# Patient Record
Sex: Female | Born: 1985 | Race: Black or African American | Hispanic: No | Marital: Single | Smoking: Former smoker
Health system: Southern US, Community
[De-identification: ages and names within clinical notes are randomized; demographics above are authoritative.]

## PROBLEM LIST (undated history)

## (undated) ENCOUNTER — Inpatient Hospital Stay (HOSPITAL_COMMUNITY): Payer: Self-pay

## (undated) DIAGNOSIS — I10 Essential (primary) hypertension: Secondary | ICD-10-CM

## (undated) DIAGNOSIS — J301 Allergic rhinitis due to pollen: Secondary | ICD-10-CM

## (undated) DIAGNOSIS — O1490 Unspecified pre-eclampsia, unspecified trimester: Secondary | ICD-10-CM

## (undated) DIAGNOSIS — J40 Bronchitis, not specified as acute or chronic: Secondary | ICD-10-CM

## (undated) HISTORY — DX: Morbid (severe) obesity due to excess calories: E66.01

## (undated) HISTORY — PX: NO PAST SURGERIES: SHX2092

---

## 2005-07-29 ENCOUNTER — Emergency Department (HOSPITAL_COMMUNITY): Admission: EM | Admit: 2005-07-29 | Discharge: 2005-07-29 | Payer: Self-pay | Admitting: Emergency Medicine

## 2006-02-06 ENCOUNTER — Emergency Department (HOSPITAL_COMMUNITY): Admission: EM | Admit: 2006-02-06 | Discharge: 2006-02-06 | Payer: Self-pay | Admitting: Emergency Medicine

## 2008-08-18 ENCOUNTER — Other Ambulatory Visit: Admission: RE | Admit: 2008-08-18 | Discharge: 2008-08-18 | Payer: Self-pay | Admitting: Family Medicine

## 2009-04-13 ENCOUNTER — Emergency Department (HOSPITAL_COMMUNITY): Admission: EM | Admit: 2009-04-13 | Discharge: 2009-04-13 | Payer: Self-pay | Admitting: Emergency Medicine

## 2009-06-30 ENCOUNTER — Emergency Department (HOSPITAL_COMMUNITY): Admission: EM | Admit: 2009-06-30 | Discharge: 2009-06-30 | Payer: Self-pay | Admitting: Family Medicine

## 2009-07-16 ENCOUNTER — Emergency Department (HOSPITAL_COMMUNITY): Admission: EM | Admit: 2009-07-16 | Discharge: 2009-07-16 | Payer: Self-pay | Admitting: Family Medicine

## 2009-11-28 ENCOUNTER — Emergency Department (HOSPITAL_COMMUNITY): Admission: EM | Admit: 2009-11-28 | Discharge: 2009-11-28 | Payer: Self-pay | Admitting: Emergency Medicine

## 2010-05-20 LAB — URINALYSIS, ROUTINE W REFLEX MICROSCOPIC
Glucose, UA: NEGATIVE mg/dL
Hgb urine dipstick: NEGATIVE

## 2010-05-20 LAB — URINE MICROSCOPIC-ADD ON

## 2010-05-25 LAB — POCT URINALYSIS DIP (DEVICE)
Bilirubin Urine: NEGATIVE
Protein, ur: NEGATIVE mg/dL

## 2010-05-25 LAB — POCT PREGNANCY, URINE: Preg Test, Ur: NEGATIVE

## 2010-09-02 ENCOUNTER — Emergency Department (HOSPITAL_COMMUNITY)
Admission: EM | Admit: 2010-09-02 | Discharge: 2010-09-02 | Disposition: A | Discharge status: Home / Self Care | Payer: Self-pay | Attending: Emergency Medicine | Admitting: Emergency Medicine

## 2010-09-02 DIAGNOSIS — R51 Headache: Secondary | ICD-10-CM | POA: Insufficient documentation

## 2010-09-02 DIAGNOSIS — K089 Disorder of teeth and supporting structures, unspecified: Secondary | ICD-10-CM | POA: Insufficient documentation

## 2010-09-02 DIAGNOSIS — K047 Periapical abscess without sinus: Secondary | ICD-10-CM | POA: Insufficient documentation

## 2010-09-02 DIAGNOSIS — R220 Localized swelling, mass and lump, head: Secondary | ICD-10-CM | POA: Insufficient documentation

## 2011-03-08 NOTE — L&D Delivery Note (Signed)
Delivery Note At 1:33 PM a viable female was delivered via Vaginal, Spontaneous Delivery (Presentation: Left Occiput Anterior).  APGAR: 7, 9.   Placenta status: Intact, Spontaneous.  Cord: 3 vessels with the following complications: None.   Anesthesia: Epidural  Episiotomy: None Lacerations: 2nd degree Perineal;Periurethral Suture Repair: 2.0 3.0 vicryl rapide Est. Blood Loss (mL): 400 ml  Mom to postpartum.  Baby to nursery-stable.  JACKSON-MOORE,Romell Cavanah A 02/20/2012, 2:11 PM

## 2011-07-12 ENCOUNTER — Encounter (HOSPITAL_COMMUNITY): Payer: Self-pay | Admitting: *Deleted

## 2011-07-12 ENCOUNTER — Emergency Department (HOSPITAL_COMMUNITY)
Admission: EM | Admit: 2011-07-12 | Discharge: 2011-07-12 | Disposition: A | Discharge status: Home / Self Care | Payer: Medicaid Other | Attending: Emergency Medicine | Admitting: Emergency Medicine

## 2011-07-12 ENCOUNTER — Inpatient Hospital Stay (HOSPITAL_COMMUNITY): Payer: Medicaid Other

## 2011-07-12 ENCOUNTER — Inpatient Hospital Stay (HOSPITAL_COMMUNITY)
Admission: AD | Admit: 2011-07-12 | Discharge: 2011-07-12 | Disposition: A | Discharge status: Home / Self Care | Payer: Medicaid Other | Source: Ambulatory Visit | Attending: Family Medicine | Admitting: Family Medicine

## 2011-07-12 DIAGNOSIS — A5901 Trichomonal vulvovaginitis: Secondary | ICD-10-CM | POA: Insufficient documentation

## 2011-07-12 DIAGNOSIS — O210 Mild hyperemesis gravidarum: Secondary | ICD-10-CM | POA: Insufficient documentation

## 2011-07-12 DIAGNOSIS — O99891 Other specified diseases and conditions complicating pregnancy: Secondary | ICD-10-CM | POA: Insufficient documentation

## 2011-07-12 DIAGNOSIS — Z331 Pregnant state, incidental: Secondary | ICD-10-CM

## 2011-07-12 DIAGNOSIS — J069 Acute upper respiratory infection, unspecified: Secondary | ICD-10-CM | POA: Insufficient documentation

## 2011-07-12 DIAGNOSIS — O98819 Other maternal infectious and parasitic diseases complicating pregnancy, unspecified trimester: Secondary | ICD-10-CM | POA: Insufficient documentation

## 2011-07-12 DIAGNOSIS — R110 Nausea: Secondary | ICD-10-CM

## 2011-07-12 LAB — CBC
MCV: 85.3 fL (ref 78.0–100.0)
Platelets: 274 10*3/uL (ref 150–400)
RBC: 4.29 MIL/uL (ref 3.87–5.11)
WBC: 9.7 10*3/uL (ref 4.0–10.5)

## 2011-07-12 LAB — ABO/RH: ABO/RH(D): A POS

## 2011-07-12 LAB — URINALYSIS, ROUTINE W REFLEX MICROSCOPIC
Bilirubin Urine: NEGATIVE
Glucose, UA: NEGATIVE mg/dL
Ketones, ur: 15 mg/dL — AB
Specific Gravity, Urine: 1.029 (ref 1.005–1.030)

## 2011-07-12 LAB — POCT PREGNANCY, URINE: Preg Test, Ur: POSITIVE — AB

## 2011-07-12 LAB — URINE MICROSCOPIC-ADD ON

## 2011-07-12 LAB — WET PREP, GENITAL: Clue Cells Wet Prep HPF POC: NONE SEEN

## 2011-07-12 LAB — HCG, QUANTITATIVE, PREGNANCY: hCG, Beta Chain, Quant, S: 43527 m[IU]/mL — ABNORMAL HIGH (ref <5)

## 2011-07-12 MED ORDER — METRONIDAZOLE 500 MG PO TABS
1000.0000 mg | ORAL_TABLET | Freq: Once | ORAL | Status: AC
  Administered 2011-07-12: 1000 mg via ORAL
  Filled 2011-07-12: qty 2

## 2011-07-12 MED ORDER — PRENATAL VITAMINS (DIS) PO TABS: 1.0000 | ORAL_TABLET | Freq: Every day | ORAL | Status: DC

## 2011-07-12 MED ORDER — METOCLOPRAMIDE HCL 10 MG PO TABS: 10.0000 mg | ORAL_TABLET | Freq: Four times a day (QID) | ORAL | Status: DC | PRN

## 2011-07-12 MED ORDER — ONDANSETRON 8 MG PO TBDP: 8.0000 mg | ORAL_TABLET | Freq: Three times a day (TID) | ORAL | Status: AC | PRN

## 2011-07-12 MED ORDER — PROMETHAZINE HCL 25 MG PO TABS: 25.0000 mg | ORAL_TABLET | Freq: Four times a day (QID) | ORAL | Status: DC | PRN

## 2011-07-12 NOTE — MAU Note (Signed)
Went to [facility] today, has been have headaches, hot at times.  Found out she was preg, wants to know how far along she is. Has been spotting the past month. LMP some time in March (mid).

## 2011-07-12 NOTE — Discharge Instructions (Signed)
Make an appointment with a gynecologist for prenatal care.  Pregnancy If you are planning on getting pregnant, it is a good idea to make a preconception appointment with your care- giver to discuss having a healthy lifestyle before getting pregnant. Such as, diet, weight, exercise, taking prenatal vitamins especially folic acid (it helps prevent brain and spinal cord defects), avoiding alcohol, smoking and illegal drugs, medical problems (diabetes, convulsions), family history of genetic problems, working conditions and immunizations. It is better to have knowledge of these things and do something about them before getting pregnant. In your pregnancy, it is important to follow certain guidelines to have a healthy baby. It is very important to get good prenatal care and follow your caregiver's instructions. Prenatal care includes all the medical care you receive before your baby's birth. This helps to prevent problems during the pregnancy and childbirth. HOME CARE INSTRUCTIONS   Start your prenatal visits by the 12th week of pregnancy or before when possible. They are usually scheduled monthly at first. They are more often in the last 2 months before delivery. It is important that you keep your caregiver's appointments and follow your caregiver's instructions regarding medication use, exercise, and diet.   During pregnancy, you are providing food for you and your baby. Eat a regular, well-balanced diet. Choose foods such as meat, fish, milk and other dairy products, vegetables, fruits, whole-grain breads and cereals. Your caregiver will inform you of the ideal weight gain depending on your current height and weight. Drink lots of liquids. Try to drink 8 glasses of water a day.   Alcohol is associated with a number of birth defects including fetal alcohol syndrome. It is best to avoid alcohol completely. Smoking will cause low birth rate and prematurity. Use of alcohol and nicotine during your pregnancy  also increases the chances that your child will be chemically dependent later in their life and may contribute to SIDS (Sudden Infant Death Syndrome).   Do not use illegal drugs.   Only take prescription or over-the-counter medications that are recommended by your caregiver. Other medications can cause genetic and physical problems in the baby.   Morning sickness can often be helped by keeping soda crackers at the bedside. Eat a couple before arising in the morning.   A sexual relationship may be continued until near the end of pregnancy if there are no other problems such as early (premature) leaking of amniotic fluid from the membranes, vaginal bleeding, painful intercourse or belly (abdominal) pain.   Exercise regularly. Check with your caregiver if you are unsure of the safety of some of your exercises.   Do not use hot tubs, steam rooms or saunas. These increase the risk of fainting or passing out and hurting yourself and the baby. Swimming is OK for exercise. Get plenty of rest, including afternoon naps when possible especially in the third trimester.   Avoid toxic odors and chemicals.   Do not wear high heels. They may cause you to lose your balance and fall.   Do not lift over 5 pounds. If you do lift anything, lift with your legs and thighs, not your back.   Avoid long trips, especially in the third trimester.   If you have to travel out of the city or state, take a copy of your medical records with you.  SEEK IMMEDIATE MEDICAL CARE IF:   You develop an unexplained oral temperature above 102 F (38.9 C), or as your caregiver suggests.   You have leaking of  fluid from the vagina. If leaking membranes are suspected, take your temperature and inform your caregiver of this when you call.   There is vaginal spotting or bleeding. Notify your caregiver of the amount and how many pads are used.   You continue to feel sick to your stomach (nauseous) and have no relief from remedies  suggested, or you throw up (vomit) blood or coffee ground like materials.   You develop upper abdominal pain.   You have round ligament discomfort in the lower abdominal area. This still must be evaluated by your caregiver.   You feel contractions of the uterus.   You do not feel the baby move, or there is less movement than before.   You have painful urination.   You have abnormal vaginal discharge.   You have persistent diarrhea.   You get a severe headache.   You have problems with your vision.   You develop muscle weakness.   You feel dizzy and faint.   You develop shortness of breath.   You develop chest pain.   You have back pain that travels down to your leg and feet.   You feel irregular or a very fast heartbeat.   You develop excessive weight gain in a short period of time (5 pounds in 3 to 5 days).   You are involved with a domestic violence situation.  Document Released: 02/21/2005 Document Revised: 02/10/2011 Document Reviewed: 08/15/2008 [facility] Patient Information 2012 [place], [place].  Upper Respiratory Infection, Adult An upper respiratory infection (URI) is also sometimes known as the common cold. The upper respiratory tract includes the nose, sinuses, throat, trachea, and bronchi. Bronchi are the airways leading to the lungs. Most people improve within 1 week, but symptoms can last up to 2 weeks. A residual cough may last even longer.  CAUSES Many different viruses can infect the tissues lining the upper respiratory tract. The tissues become irritated and inflamed and often become very moist. Mucus production is also common. A cold is contagious. You can easily spread the virus to others by oral contact. This includes kissing, sharing a glass, coughing, or sneezing. Touching your mouth or nose and then touching a surface, which is then touched by another person, can also spread the virus. SYMPTOMS  Symptoms typically develop 1 to 3 days after you come in  contact with a cold virus. Symptoms vary from person to person. They may include:  Runny nose.   Sneezing.   Nasal congestion.   Sinus irritation.   Sore throat.   Loss of voice (laryngitis).   Cough.   Fatigue.   Muscle aches.   Loss of appetite.   Headache.   Low-grade fever.  DIAGNOSIS  You might diagnose your own cold based on familiar symptoms, since most people get a cold 2 to 3 times a year. Your caregiver can confirm this based on your exam. Most importantly, your caregiver can check that your symptoms are not due to another disease such as strep throat, sinusitis, pneumonia, asthma, or epiglottitis. Blood tests, throat tests, and X-rays are not necessary to diagnose a common cold, but they may sometimes be helpful in excluding other more serious diseases. Your caregiver will decide if any further tests are required. RISKS AND COMPLICATIONS  You may be at risk for a more severe case of the common cold if you smoke cigarettes, have chronic heart disease (such as heart failure) or lung disease (such as asthma), or if you have a weakened immune system. The  very young and very old are also at risk for more serious infections. Bacterial sinusitis, middle ear infections, and bacterial pneumonia can complicate the common cold. The common cold can worsen asthma and chronic obstructive pulmonary disease (COPD). Sometimes, these complications can require emergency medical care and may be life-threatening. PREVENTION  The best way to protect against getting a cold is to practice good hygiene. Avoid oral or hand contact with people with cold symptoms. Wash your hands often if contact occurs. There is no clear evidence that vitamin C, vitamin E, echinacea, or exercise reduces the chance of developing a cold. However, it is always recommended to get plenty of rest and practice good nutrition. TREATMENT  Treatment is directed at relieving symptoms. There is no cure. Antibiotics are not  effective, because the infection is caused by a virus, not by bacteria. Treatment may include:  Increased fluid intake. Sports drinks offer valuable electrolytes, sugars, and fluids.   Breathing heated mist or steam (vaporizer or shower).   Eating chicken soup or other clear broths, and maintaining good nutrition.   Getting plenty of rest.   Using gargles or lozenges for comfort.   Controlling fevers with ibuprofen or acetaminophen as directed by your caregiver.   Increasing usage of your inhaler if you have asthma.  Zinc gel and zinc lozenges, taken in the first 24 hours of the common cold, can shorten the duration and lessen the severity of symptoms. Pain medicines may help with fever, muscle aches, and throat pain. A variety of non-prescription medicines are available to treat congestion and runny nose. Your caregiver can make recommendations and may suggest nasal or lung inhalers for other symptoms.  HOME CARE INSTRUCTIONS   Only take over-the-counter or prescription medicines for pain, discomfort, or fever as directed by your caregiver.   Use a warm mist humidifier or inhale steam from a shower to increase air moisture. This may keep secretions moist and make it easier to breathe.   Drink enough water and fluids to keep your urine clear or pale yellow.   Rest as needed.   Return to work when your temperature has returned to normal or as your caregiver advises. You may need to stay home longer to avoid infecting others. You can also use a face mask and careful hand washing to prevent spread of the virus.  SEEK MEDICAL CARE IF:   After the first few days, you feel you are getting worse rather than better.   You need your caregiver's advice about medicines to control symptoms.   You develop chills, worsening shortness of breath, or brown or red sputum. These may be signs of pneumonia.   You develop yellow or brown nasal discharge or pain in the face, especially when you bend  forward. These may be signs of sinusitis.   You develop a fever, swollen neck glands, pain with swallowing, or white areas in the back of your throat. These may be signs of strep throat.  SEEK IMMEDIATE MEDICAL CARE IF:   You have a fever.   You develop severe or persistent headache, ear pain, sinus pain, or chest pain.   You develop wheezing, a prolonged cough, cough up blood, or have a change in your usual mucus (if you have chronic lung disease).   You develop sore muscles or a stiff neck.  Document Released: 08/17/2000 Document Revised: 02/10/2011 Document Reviewed: 06/25/2010 [facility] Patient Information 2012 [place], [place].  Nausea, Adult Nausea is the feeling that you have an upset stomach or  have to vomit. Nausea by itself is not likely a serious concern, but it may be an early sign of more serious medical problems. As nausea gets worse, it can lead to vomiting. If vomiting develops, there is the risk of dehydration.  CAUSES   Viral infections.   Food poisoning.   Medicines.   Pregnancy.   Motion sickness.   Migraine headaches.   Emotional distress.   Severe pain from any source.   Alcohol intoxication.  HOME CARE INSTRUCTIONS  Get plenty of rest.   Ask your caregiver about specific rehydration instructions.   Eat small amounts of food and sip liquids more often.   Take all medicines as told by your caregiver.  SEEK MEDICAL CARE IF:  You have not improved after 2 days, or you get worse.   You have a headache.  SEEK IMMEDIATE MEDICAL CARE IF:   You have a fever.   You faint.   You keep vomiting or have blood in your vomit.   You are extremely weak or dehydrated.   You have dark or bloody stools.   You have severe chest or abdominal pain.  MAKE SURE YOU:  Understand these instructions.   Will watch your condition.   Will get help right away if you are not doing well or get worse.  Document Released: 03/31/2004 Document Revised: 02/10/2011  Document Reviewed: 11/03/2010 [facility] Patient Information 2012 [place], [place].  Vitamin A capsules and tablets What is this medicine? VITAMIN A (VAHY tuh min A) is a vitamin found in nature. It is added to a healthy diet to prevent or treat low vitamin A levels. It is also used to treat some genetic skin problems. This vitamin may be used for other purposes; ask your health care provider or pharmacist if you have questions. This medicine may be used for other purposes; ask your health care provider or pharmacist if you have questions. What should I tell my health care provider before I take this medicine? They need to know if you have any of the following conditions: -high levels of vitamin A in the body -kidney disease -liver disease -an unusual or allergic reaction to vitamin A, other medicines, foods, dyes, or preservatives -pregnant or trying to get pregnant -breast-feeding How should I use this medicine? Take this vitamin by mouth with a glass of water. Follow the directions on the package or prescription label. For best results take this vitamin with food. Take your vitamin at regular intervals. Do not take your vitamin more often than directed. Talk to your pediatrician regarding the use of this medicine in children. While this drug may be prescribed for selected conditions, precautions do apply. Overdosage: If you think you have taken too much of this medicine contact a poison control center or emergency room at once. NOTE: This medicine is only for you. Do not share this medicine with others. What if I miss a dose? If you miss a dose, take it as soon as you can. If it is almost time for your next dose, take only that dose. Do not take double or extra doses. What may interact with this medicine? Do not take this medicine with any of the following medications: -other vitamin A or retinoid products This medicine may also interact with the following medications: -beta-carotene  supplements -cholestyramine -mineral oil -orlistat This list may not describe all possible interactions. Give your health care provider a list of all the medicines, herbs, non-prescription drugs, or dietary supplements you use. Also tell  them if you smoke, drink alcohol, or use illegal drugs. Some items may interact with your medicine. What should I watch for while using this medicine? Follow a good diet. Taking a vitamin supplement does not replace the need for a balanced diet. Some foods that have this vitamin naturally are green and yellow fruits and vegetables, also eggs, butter, milk, meat, and oily fish. Too much of this vitamin can be unsafe. Talk to your doctor or health care provider about how much is right for you. What side effects may I notice from receiving this medicine? Side effects that you should report to your doctor or health care professional as soon as possible: -allergic reactions like skin rash, itching or hives, swelling of the face, lips, or tongue -dark urine -dry, cracked or peeling of skin -joint pains -unusual bleeding or bruising -unusually weak or tired -yellowing of the eyes or skin Side effects that usually do not require medical attention (report to your doctor or health care professional if they continue or are bothersome): -diarrhea -yellowing of the face, palms of the hands, soles of the feet This list may not describe all possible side effects. Call your doctor for medical advice about side effects. You may report side effects to FDA at 1-800-FDA-1088. Where should I keep my medicine? Keep out of the reach of children. Store at room temperature between 15 and 30 degrees C (59 and 85 degrees F). Protect from heat and light. Throw away any unused medicine after the expiration date. NOTE: This sheet is a summary. It may not cover all possible information. If you have questions about this medicine, talk to your doctor, pharmacist, or health care provider.   2012, [employer] Standard. (11/19/2007 5:37:20 PM)  Metoclopramide tablets What is this medicine? METOCLOPRAMIDE (met oh kloe PRA mide) is used to treat the symptoms of gastroesophageal reflux disease (GERD) like heartburn. It is also used to treat people with slow emptying of the stomach and intestinal tract. This medicine may be used for other purposes; ask your health care provider or pharmacist if you have questions. What should I tell my health care provider before I take this medicine? They need to know if you have any of these conditions: -breast cancer -depression -diabetes -heart failure -high blood pressure -kidney disease -liver disease -Parkinson's disease or a movement disorder -pheochromocytoma -seizures -stomach obstruction, bleeding, or perforation -an unusual or allergic reaction to metoclopramide, procainamide, sulfites, other medicines, foods, dyes, or preservatives -pregnant or trying to get pregnant -breast-feeding How should I use this medicine? Take this medicine by mouth with a glass of water. Follow the directions on the prescription label. Take this medicine on an empty stomach, about 30 minutes before eating. Take your doses at regular intervals. Do not take your medicine more often than directed. Do not stop taking except on the advice of your doctor or health care professional. A special MedGuide will be given to you by the pharmacist with each prescription and refill. Be sure to read this information carefully each time. Talk to your pediatrician regarding the use of this medicine in children. Special care may be needed. Overdosage: If you think you have taken too much of this medicine contact a poison control center or emergency room at once. NOTE: This medicine is only for you. Do not share this medicine with others. What if I miss a dose? If you miss a dose, take it as soon as you can. If it is almost time for your next dose, take  only that dose. Do not  take double or extra doses. What may interact with this medicine? -acetaminophen -cyclosporine -digoxin -medicines for blood pressure -medicines for diabetes, including insulin -medicines for hay fever and other allergies -medicines for depression, especially an Monoamine Oxidase Inhibitor (MAOI) -medicines for Parkinson's disease, like levodopa -medicines for sleep or for pain -tetracycline This list may not describe all possible interactions. Give your health care provider a list of all the medicines, herbs, non-prescription drugs, or dietary supplements you use. Also tell them if you smoke, drink alcohol, or use illegal drugs. Some items may interact with your medicine. What should I watch for while using this medicine? It may take a few weeks for your stomach condition to start to get better. However, do not take this medicine for longer than 12 weeks. The longer you take this medicine, and the more you take it, the greater your chances are of developing serious side effects. If you are an elderly patient, a female patient, or you have diabetes, you may be at an increased risk for side effects from this medicine. Contact your doctor immediately if you start having movements you cannot control such as lip smacking, rapid movements of the tongue, involuntary or uncontrollable movements of the eyes, head, arms and legs, or muscle twitches and spasms. Patients and their families should watch out for worsening depression or thoughts of suicide. Also watch out for any sudden or severe changes in feelings such as feeling anxious, agitated, panicky, irritable, hostile, aggressive, impulsive, severely restless, overly excited and hyperactive, or not being able to sleep. If this happens, especially at the beginning of treatment or after a change in dose, call your doctor. Do not treat yourself for high fever. Ask your doctor or health care professional for advice. You may get drowsy or dizzy. Do not  drive, use machinery, or do anything that needs mental alertness until you know how this drug affects you. Do not stand or sit up quickly, especially if you are an older patient. This reduces the risk of dizzy or fainting spells. Alcohol can make you more drowsy and dizzy. Avoid alcoholic drinks. What side effects may I notice from receiving this medicine? Side effects that you should report to your doctor or health care professional as soon as possible: -allergic reactions like skin rash, itching or hives, swelling of the face, lips, or tongue -abnormal production of milk in females -breast enlargement in both males and females -change in the way you walk -difficulty moving, speaking or swallowing -drooling, lip smacking, or rapid movements of the tongue -excessive sweating -fever -involuntary or uncontrollable movements of the eyes, head, arms and legs -irregular heartbeat or palpitations -muscle twitches and spasms -unusually weak or tired Side effects that usually do not require medical attention (report to your doctor or health care professional if they continue or are bothersome): -change in sex drive or performance -depressed mood -diarrhea -difficulty sleeping -headache -menstrual changes -restless or nervous This list may not describe all possible side effects. Call your doctor for medical advice about side effects. You may report side effects to FDA at 1-800-FDA-1088. Where should I keep my medicine? Keep out of the reach of children. Store at room temperature between 20 and 25 degrees C (68 and 77 degrees F). Protect from light. Keep container tightly closed. Throw away any unused medicine after the expiration date. NOTE: This sheet is a summary. It may not cover all possible information. If you have questions about this medicine, talk to  your doctor, pharmacist, or health care provider.  2012, [employer] Standard. (10/17/2007 4:30:05 PM)

## 2011-07-12 NOTE — ED Provider Notes (Signed)
History  This chart was scribed for Dione Booze, MD by Bennett Scrape. This patient was seen in room STRE3/STRE3 and the patient's care was started at 1:51PM.  CSN: [id number]  Arrival date & time 07/12/11  1133   First MD Initiated Contact with Patient 07/12/11 1351      Chief Complaint  Patient presents with  . URI  . Nausea  . Abdominal Pain     The history is provided by the patient. No language interpreter was used.     Lisa Charles is a 26 y.o. female who presents to the Emergency Department complaining of 4 days of HA and nasal congestion with associated abdominal pain that started yesterday and nausea that started today. She reports taking ibuprofen and over the counter allegry medication with mild improvement in symptoms. She rates her pain a 5 or 6 at its worst. She has sick contacts at work with pneumonia and a "stomach virus". She denies fever, chills, and diarrhea as associated symptoms. She reports that has been having vaginal spotting "on and off" for the last 3 weeks. Her last NMP was in March. She is sexually active but has not been using protection or taking birth control. She has no h/o chronic medical conditions.  No PCP.   History reviewed. No pertinent past medical history.  History reviewed. No pertinent past surgical history.  No family history on file.  History  Substance Use Topics  . Smoking status: Never Smoker   . Smokeless tobacco: Not on file  . Alcohol Use: Yes     Review of Systems  Constitutional: Negative for fever and chills.  HENT: Positive for congestion.   Respiratory: Negative for shortness of breath.   Gastrointestinal: Positive for nausea and abdominal pain. Negative for vomiting.  Neurological: Positive for headaches. Negative for weakness.    Allergies  Review of patient's allergies indicates no known allergies.  Home Medications   Current Outpatient Rx  Name Route Sig Dispense Refill  . ALBUTEROL SULFATE HFA 108 (90  BASE) MCG/ACT IN AERS Inhalation Inhale 2 puffs into the lungs every 6 (six) hours as needed. For shortness of breath.    . CETIRIZINE HCL 10 MG PO TABS Oral Take 10 mg by mouth daily.    . IBUPROFEN 200 MG PO TABS Oral Take 200 mg by mouth every 6 (six) hours as needed. For headaches.      Triage Vitals: BP 119/58  Pulse 74  Temp(Src) 98.8 F (37.1 C) (Oral)  Resp 16  Ht 5\' 9"  (1.753 m)  SpO2 100%  Physical Exam  Nursing note and vitals reviewed. Constitutional: She is oriented to person, place, and time. She appears well-developed and well-nourished. No distress.  HENT:  Head: Normocephalic and atraumatic.       Mild edema of the turbinate, slight mucoid drainage  Eyes: EOM are normal.  Neck: Neck supple. No tracheal deviation present.  Cardiovascular: Normal rate.   Pulmonary/Chest: Effort normal. No respiratory distress.  Abdominal: Soft. There is no tenderness.       Bowel sounds are decreased  Musculoskeletal: Normal range of motion. She exhibits edema.       Extremities have trace edema  Lymphadenopathy:    She has no cervical adenopathy.  Neurological: She is alert and oriented to person, place, and time.  Skin: Skin is warm and dry.  Psychiatric: She has a normal mood and affect. Her behavior is normal.    ED Course  Procedures (including critical care time)  DIAGNOSTIC STUDIES: Oxygen Saturation is 100% on room air, normal by my interpretation.    COORDINATION OF CARE: 2:07PM-Informed pt of positive pregnancy test and will refer to [facility] on-call. Pt requested an [place], but I advised that this would not be appropriate for her CC of URI. Discussed antinausea (Reglan) and prenatal vitamins as discharge plan with pt and pt agreed. Advised pt to take over the counter pepcid or zantac.    Results for orders placed during the hospital encounter of 07/12/11  URINALYSIS, ROUTINE W REFLEX MICROSCOPIC      Component Value Range   Color, Urine YELLOW  YELLOW    APPearance  CLEAR  CLEAR    Specific Gravity, Urine 1.029  1.005 - 1.030    pH 7.0  5.0 - 8.0    Glucose, UA NEGATIVE  NEGATIVE (mg/dL)   Hgb urine dipstick NEGATIVE  NEGATIVE    Bilirubin Urine NEGATIVE  NEGATIVE    Ketones, ur 15 (*) NEGATIVE (mg/dL)   Protein, ur NEGATIVE  NEGATIVE (mg/dL)   Urobilinogen, UA 1.0  0.0 - 1.0 (mg/dL)   Nitrite NEGATIVE  NEGATIVE    Leukocytes, UA MODERATE (*) NEGATIVE   POCT PREGNANCY, URINE      Component Value Range   Preg Test, Ur POSITIVE (*) NEGATIVE   URINE MICROSCOPIC-ADD ON      Component Value Range   Squamous Epithelial / LPF FEW (*) RARE    WBC, UA 3-6  <3 (WBC/hpf)   RBC / HPF 0-2  <3 (RBC/hpf)   Bacteria, UA RARE  RARE    Urine-Other MUCOUS PRESENT       1. Upper respiratory infection   2. Pregnancy as incidental finding   3. Nausea       MDM  Upper respiratory infection. Latency is an incidental finding. X-rays did not obtain because of pregnancy. She has requested an ultrasound of the cause of that would not be appropriate emergency care for an upper respiratory infection. She is sent home with prescriptions for metoclopramide and prenatal vitamins and is referred to the on-call gynecologist for followup.      I personally performed the services described in this documentation, which was scribed in my presence. The recorded information has been reviewed and considered.      Dione Booze, MD 07/17/11 [id number]

## 2011-07-12 NOTE — MAU Provider Note (Signed)
Chart reviewed and agree with management and plan.  

## 2011-07-12 NOTE — Discharge Instructions (Signed)
Trichomoniasis Trichomoniasis is an infection, caused by the Trichomonas organism, that affects both women and men. In women, the outer female genitalia and the vagina are affected. In men, the penis is mainly affected, but the prostate and other reproductive organs can also be involved. Trichomoniasis is a sexually transmitted disease (STD) and is most often passed to another person through sexual contact. The majority of people who get trichomoniasis do so from a sexual encounter and are also at risk for other STDs. CAUSES   Sexual intercourse with an infected partner.   It can be present in swimming pools or hot tubs.  SYMPTOMS   Abnormal gray-green frothy vaginal discharge in women.   Vaginal itching and irritation in women.   Itching and irritation of the area outside the vagina in women.   Penile discharge with or without pain in males.   Inflammation of the urethra (urethritis), causing painful urination.   Bleeding after sexual intercourse.  RELATED COMPLICATIONS  Pelvic inflammatory disease.   Infection of the uterus (endometritis).   Infertility.   Tubal (ectopic) pregnancy.   It can be associated with other STDs, including gonorrhea and chlamydia, hepatitis B, and HIV.  COMPLICATIONS DURING PREGNANCY  Early (premature) delivery.   Premature rupture of the membranes (PROM).   Low birth weight.  DIAGNOSIS   Visualization of Trichomonas under the microscope from the vagina discharge.   Ph of the vagina greater than 4.5, tested with a test tape.   Trich Rapid Test.   Culture of the organism, but this is not usually needed.   It may be found on a Pap test.   Having a "strawberry cervix,"which means the cervix looks very red like a strawberry.  TREATMENT   You may be given medication to fight the infection. Inform your caregiver if you could be or are pregnant. Some medications used to treat the infection should not be taken during pregnancy.    Over-the-counter medications or creams to decrease itching or irritation may be recommended.   Your sexual partner will need to be treated if infected.  HOME CARE INSTRUCTIONS   Take all medication prescribed by your caregiver.   Take over-the-counter medication for itching or irritation as directed by your caregiver.   Do not have sexual intercourse while you have the infection.   Do not douche or wear tampons.   Discuss your infection with your partner, as your partner may have acquired the infection from you. Or, your partner may have been the person who transmitted the infection to you.   Have your sex partner examined and treated if necessary.   Practice safe, informed, and protected sex.   See your caregiver for other STD testing.  SEEK MEDICAL CARE IF:   You still have symptoms after you finish the medication.   You have an oral temperature above 102 F (38.9 C).   You develop belly (abdominal) pain.   You have pain when you urinate.   You have bleeding after sexual intercourse.   You develop a rash.   The medication makes you sick or makes you throw up (vomit).  Document Released: 08/17/2000 Document Revised: 02/10/2011 Document Reviewed: 09/12/2008 [facility] Patient Information 2012 [place], [place].Trichomoniasis Trichomoniasis is an infection, caused by the Trichomonas organism, that affects both women and men. In women, the outer female genitalia and the vagina are affected. In men, the penis is mainly affected, but the prostate and other reproductive organs can also be involved. Trichomoniasis is a sexually transmitted disease (STD)  and is most often passed to another person through sexual contact. The majority of people who get trichomoniasis do so from a sexual encounter and are also at risk for other STDs. CAUSES   Sexual intercourse with an infected partner.   It can be present in swimming pools or hot tubs.  SYMPTOMS   Abnormal gray-green frothy  vaginal discharge in women.   Vaginal itching and irritation in women.   Itching and irritation of the area outside the vagina in women.   Penile discharge with or without pain in males.   Inflammation of the urethra (urethritis), causing painful urination.   Bleeding after sexual intercourse.  RELATED COMPLICATIONS  Pelvic inflammatory disease.   Infection of the uterus (endometritis).   Infertility.   Tubal (ectopic) pregnancy.   It can be associated with other STDs, including gonorrhea and chlamydia, hepatitis B, and HIV.  COMPLICATIONS DURING PREGNANCY  Early (premature) delivery.   Premature rupture of the membranes (PROM).   Low birth weight.  DIAGNOSIS   Visualization of Trichomonas under the microscope from the vagina discharge.   Ph of the vagina greater than 4.5, tested with a test tape.   Trich Rapid Test.   Culture of the organism, but this is not usually needed.   It may be found on a Pap test.   Having a "strawberry cervix,"which means the cervix looks very red like a strawberry.  TREATMENT   You may be given medication to fight the infection. Inform your caregiver if you could be or are pregnant. Some medications used to treat the infection should not be taken during pregnancy.   Over-the-counter medications or creams to decrease itching or irritation may be recommended.   Your sexual partner will need to be treated if infected.  HOME CARE INSTRUCTIONS   Take all medication prescribed by your caregiver.   Take over-the-counter medication for itching or irritation as directed by your caregiver.   Do not have sexual intercourse while you have the infection.   Do not douche or wear tampons.   Discuss your infection with your partner, as your partner may have acquired the infection from you. Or, your partner may have been the person who transmitted the infection to you.   Have your sex partner examined and treated if necessary.   Practice  safe, informed, and protected sex.   See your caregiver for other STD testing.  SEEK MEDICAL CARE IF:   You still have symptoms after you finish the medication.   You have an oral temperature above 102 F (38.9 C).   You develop belly (abdominal) pain.   You have pain when you urinate.   You have bleeding after sexual intercourse.   You develop a rash.   The medication makes you sick or makes you throw up (vomit).  Document Released: 08/17/2000 Document Revised: 02/10/2011 Document Reviewed: 09/12/2008 [employer] Patient Information 2012 [place].    ________________________________________     To schedule your Maternity Eligibility Appointment, please call [phone].  When you arrive for your appointment you must bring the following items or information listed below.  Your appointment will be rescheduled if you do not have these items or are 15 minutes late. If currently receiving Medicaid, you MUST bring: 1. Medicaid Card 2. Social Security Card 3. Picture ID 4. Proof of Pregnancy 5. Verification of current address if the address on Medicaid card is incorrect "postmarked mail" If not receiving Medicaid, you MUST bring: 1. Social Security Card 2. Picture ID  3. Birth Certificate (if available) Passport or *Green Card 4. Proof of Pregnancy 5. Verification of current address "postmarked mail" for each income presented. 6. Verification of insurance coverage, if any 7. Check stubs from each employer for the previous month (if unable to present check stub  for each week, we will accept check stub for the first and last week ill the same month.) If you can't locate check stubs, you must bring a letter from the employer(s) and it must have the following information on letterhead, typed, in English: o name of company o company telephone number o how long been with the company, if less than one month o how much person earns per hour o how many hours per week work o the  gross pay the person earned for the previous month If you are 26 years old or less, you do not have to bring proof of income unless you work or live with the father of the baby and at that time we will need proof of income from you and/or the father of the baby. Green Card recipients are eligible for Medicaid for Pregnant Women (MPW)

## 2011-07-12 NOTE — Progress Notes (Signed)
Speculum Exam done.cultures obtained and no discomfort noted

## 2011-07-12 NOTE — MAU Note (Signed)
Pt states she has been spotting since April and has not had a period since March. Pt denies pain

## 2011-07-12 NOTE — ED Notes (Signed)
Patient reports onset of nasal congestion on Saturday.  She developed stomach pain on yesterday and nasuea today.

## 2011-07-12 NOTE — MAU Provider Note (Signed)
History     CSN: [id number]  Arrival date and time: 07/12/11 1548   First Provider Initiated Contact with Patient 07/12/11 1637      Chief Complaint  Patient presents with  . Vaginal Bleeding  . Possible Pregnancy   HPI Pt is pregnant with LMP 3/15 and just found out she was pregnant when she went to [facility] for URI.  She spotted today and the end of April but no cramping.  She was not using anything for contraception. Pt last had IC January 22.  The baby's father is locked up since the end of January.  She denies constipation, diarrhea, fever, chills.  She has been nauseated and tired.  She has not been vomiting.     Past Medical History  Diagnosis Date  . No pertinent past medical history     Past Surgical History  Procedure Date  . No past surgeries     Family History  Problem Relation Age of Onset  . Diabetes Mother   . Hypertension Mother   . Hyperlipidemia Mother   . Gout Father     History  Substance Use Topics  . Smoking status: Never Smoker   . Smokeless tobacco: Not on file  . Alcohol Use: Yes    Allergies: No Known Allergies  Prescriptions prior to admission  Medication Sig Dispense Refill  . albuterol (PROVENTIL HFA;VENTOLIN HFA) 108 (90 BASE) MCG/ACT inhaler Inhale 2 puffs into the lungs every 6 (six) hours as needed. For shortness of breath.      . cetirizine (ZYRTEC) 10 MG tablet Take 10 mg by mouth daily.      Marland Kitchen ibuprofen (ADVIL,MOTRIN) 200 MG tablet Take 400 mg by mouth every 6 (six) hours as needed. For headaches.      . metoCLOPramide (REGLAN) 10 MG tablet Take 1 tablet (10 mg total) by mouth every 6 (six) hours as needed (nausea).  30 tablet  0  . Prenatal Vit-Fe Fumarate-FA (PRENATAL MULTIVITAMIN) TABS Take 1 tablet by mouth daily. Patient has prescription but has not started yet      . DISCONTD: Prenatal Vitamins (DIS) TABS Take 1 tablet by mouth daily.  30 tablet  0    Review of Systems  Constitutional: Negative for fever and  chills.  HENT: Positive for congestion.   Gastrointestinal: Positive for nausea and abdominal pain. Negative for vomiting, diarrhea and constipation.  Genitourinary: Negative for dysuria, urgency and frequency.  Neurological: Negative for headaches.   Physical Exam   Blood pressure 133/71, pulse 79, temperature 97.2 F (36.2 C), temperature source Oral, resp. rate 20, height 5\' 8"  (1.727 m), weight 269 lb (122.018 kg), last menstrual period 05/20/2011.  Physical Exam  Vitals reviewed. Constitutional: She is oriented to person, place, and time. She appears well-developed and well-nourished.  HENT:  Head: Normocephalic.  Eyes: Pupils are equal, round, and reactive to light.  Neck: Normal range of motion. Neck supple.  Cardiovascular: Normal rate.   Respiratory: Effort normal.  GI: Soft. She exhibits no distension. There is no tenderness. There is no rebound and no guarding.  Genitourinary:       Mod amount of yellow watery discharge in vault; cervix clean NT; no bleeding noted- bimanual unable to assess due to habitus- NT  Musculoskeletal: Normal range of motion.  Neurological: She is alert and oriented to person, place, and time.  Skin: Skin is warm and dry.  Psychiatric: She has a normal mood and affect.    MAU Course  Procedures Clinical Data: Positive pregnancy test with vaginal spotting.  OBSTETRIC <14 WK [place] AND TRANSVAGINAL OB US  Technique: Both transabdominal and transvaginal ultrasound  examinations were performed for complete evaluation of the  gestation as well as the maternal uterus, adnexal regions, and  pelvic cul-de-sac. Transvaginal technique was performed to assess  early pregnancy.  Comparison: None.  Intrauterine gestational sac: Single sac visualized. Position and  configuration is unremarkable.  Yolk sac: Visualized  Embryo: Visualized  Cardiac Activity: Visualized  Heart Rate: 165 bpm  CRL: 16 mm 8 w 1 d [place] EDC: 02/20/2012  Maternal uterus/adnexae:    Maternal ovaries are sonographically normal in appearance. No  evidence for subchorionic hemorrhage. No free fluid is identified  in the cul-de-sac.  IMPRESSION:  Single living intrauterine gestation at estimated 8-week-1-day  gestational age by crown-rump length.  Original Report Authenticated By: ERIC A. MANSELL, M.D. Results for orders placed during the hospital encounter of 07/12/11 (from the past 24 hour(s))  CBC     Status: Abnormal   Collection Time   07/12/11  4:20 PM      Component Value Range   WBC 9.7  4.0 - 10.5 (K/uL)   RBC 4.29  3.87 - 5.11 (MIL/uL)   Hemoglobin 11.8 (*) 12.0 - 15.0 (g/dL)   HCT 16.1  09.6 - 04.5 (%)   MCV 85.3  78.0 - 100.0 (fL)   MCH 27.5  26.0 - 34.0 (pg)   MCHC 32.2  30.0 - 36.0 (g/dL)   RDW 40.9  81.1 - 91.4 (%)   Platelets 274  150 - 400 (K/uL)  ABO/RH     Status: Normal   Collection Time   07/12/11  4:20 PM      Component Value Range   ABO/RH(D) A POS    HCG, QUANTITATIVE, PREGNANCY     Status: Abnormal   Collection Time   07/12/11  4:20 PM      Component Value Range   hCG, Beta Chain, Quant, S 43527 (*) <5 (mIU/mL)  WET PREP, GENITAL     Status: Abnormal   Collection Time   07/12/11  4:57 PM      Component Value Range   Yeast Wet Prep HPF POC NONE SEEN  NONE SEEN    Trich, Wet Prep MODERATE (*) NONE SEEN    Clue Cells Wet Prep HPF POC NONE SEEN  NONE SEEN    WBC, Wet Prep HPF POC MANY (*) NONE SEEN    Flagyl 1gm ordered another 1 gm ordered to make the 2 gm dose for Trichomonas Assessment and Plan  Trichomonas vaginitis-partner to be treated [redacted]w[redacted]d viable single IUP- proceed with OB care URI Kace Hartje 07/12/2011, 4:38 PM

## 2011-07-14 LAB — GC/CHLAMYDIA PROBE AMP, GENITAL
Chlamydia, DNA Probe: NEGATIVE
GC Probe Amp, Genital: NEGATIVE

## 2011-08-29 ENCOUNTER — Other Ambulatory Visit: Payer: Self-pay | Admitting: Obstetrics

## 2011-08-29 DIAGNOSIS — Z09 Encounter for follow-up examination after completed treatment for conditions other than malignant neoplasm: Secondary | ICD-10-CM

## 2011-09-01 ENCOUNTER — Other Ambulatory Visit: Payer: Self-pay | Admitting: Obstetrics

## 2011-09-01 ENCOUNTER — Ambulatory Visit (HOSPITAL_COMMUNITY)
Admission: RE | Admit: 2011-09-01 | Discharge: 2011-09-01 | Disposition: A | Discharge status: Home / Self Care | Payer: Medicaid Other | Source: Ambulatory Visit | Attending: Obstetrics | Admitting: Obstetrics

## 2011-09-01 DIAGNOSIS — Z3689 Encounter for other specified antenatal screening: Secondary | ICD-10-CM | POA: Insufficient documentation

## 2011-09-01 DIAGNOSIS — Z09 Encounter for follow-up examination after completed treatment for conditions other than malignant neoplasm: Secondary | ICD-10-CM

## 2011-12-01 ENCOUNTER — Emergency Department (HOSPITAL_COMMUNITY)
Admission: EM | Admit: 2011-12-01 | Discharge: 2011-12-01 | Disposition: A | Discharge status: Home / Self Care | Payer: Medicaid Other | Attending: Emergency Medicine | Admitting: Emergency Medicine

## 2011-12-01 ENCOUNTER — Encounter (HOSPITAL_COMMUNITY): Payer: Self-pay | Admitting: Neurology

## 2011-12-01 ENCOUNTER — Emergency Department (HOSPITAL_COMMUNITY): Payer: Medicaid Other

## 2011-12-01 DIAGNOSIS — B9689 Other specified bacterial agents as the cause of diseases classified elsewhere: Secondary | ICD-10-CM | POA: Insufficient documentation

## 2011-12-01 DIAGNOSIS — N760 Acute vaginitis: Secondary | ICD-10-CM | POA: Insufficient documentation

## 2011-12-01 DIAGNOSIS — Z79899 Other long term (current) drug therapy: Secondary | ICD-10-CM | POA: Insufficient documentation

## 2011-12-01 DIAGNOSIS — O2390 Unspecified genitourinary tract infection in pregnancy, unspecified trimester: Secondary | ICD-10-CM | POA: Insufficient documentation

## 2011-12-01 DIAGNOSIS — Z3490 Encounter for supervision of normal pregnancy, unspecified, unspecified trimester: Secondary | ICD-10-CM

## 2011-12-01 DIAGNOSIS — A499 Bacterial infection, unspecified: Secondary | ICD-10-CM | POA: Insufficient documentation

## 2011-12-01 DIAGNOSIS — R102 Pelvic and perineal pain: Secondary | ICD-10-CM

## 2011-12-01 LAB — URINE CULTURE: Culture: NO GROWTH

## 2011-12-01 LAB — URINALYSIS, ROUTINE W REFLEX MICROSCOPIC
Bilirubin Urine: NEGATIVE
Hgb urine dipstick: NEGATIVE
Ketones, ur: NEGATIVE mg/dL
Specific Gravity, Urine: 1.012 (ref 1.005–1.030)
Urobilinogen, UA: 0.2 mg/dL (ref 0.0–1.0)

## 2011-12-01 LAB — WET PREP, GENITAL: Yeast Wet Prep HPF POC: NONE SEEN

## 2011-12-01 LAB — URINE MICROSCOPIC-ADD ON

## 2011-12-01 MED ORDER — METRONIDAZOLE 500 MG PO TABS: 500.0000 mg | ORAL_TABLET | Freq: Two times a day (BID) | ORAL | Status: DC

## 2011-12-01 NOTE — ED Notes (Signed)
Patients OBGYN, Dr. Clearance Coots notified and updated on NST results Reactive tracing No CXT No vaginal bleeding or leaking No signs of labor or fetal distress Ok for patient to D/C home when cleared by ED MD.

## 2011-12-01 NOTE — ED Provider Notes (Signed)
History     CSN: [id number]  Arrival date & time 12/01/11  3086   First MD Initiated Contact with Patient 12/01/11 [phone]      Chief Complaint  Patient presents with  . Vaginal Pain    (Consider location/radiation/quality/duration/timing/severity/associated sxs/prior treatment) HPI Comments: 26 year old female who is 7 months pregnant presents to emergency department with vaginal pain for the past 3 days. She describes the pain as a pressure feeling rated 5/10. The pain is intermittent, worse when she is sitting for period of time. Admits to associated lower back pain. Denies any abdominal or pelvic pain, vaginal bleeding or discharge, nausea, vomiting, fever, chills, bowel or bladder complaints. No chest pain or shortness of breath. She's had no issues with this pregnancy including diabetes or high blood pressure. This is her first pregnancy. She has an appointment with her OB/GYN on Monday, and has not called their office to discuss the vaginal pressure. She has not had intercourse since March.  The history is provided by the patient.    Past Medical History  Diagnosis Date  . No pertinent past medical history     Past Surgical History  Procedure Date  . No past surgeries     Family History  Problem Relation Age of Onset  . Diabetes Mother   . Hypertension Mother   . Hyperlipidemia Mother   . Gout Father     History  Substance Use Topics  . Smoking status: Never Smoker   . Smokeless tobacco: Not on file  . Alcohol Use: No    OB History    Grav Para Term Preterm Abortions TAB SAB Ect Mult Living   1               Review of Systems  Constitutional: Negative for fever, chills and activity change.  HENT: Negative for neck pain.   Respiratory: Negative for shortness of breath.   Cardiovascular: Negative for chest pain and leg swelling.  Gastrointestinal: Negative for nausea, vomiting and abdominal pain.  Genitourinary: Positive for vaginal pain. Negative for  dysuria, urgency, hematuria, flank pain, vaginal bleeding, vaginal discharge, difficulty urinating and pelvic pain.  Musculoskeletal: Positive for back pain.  Skin: Negative for rash.  Neurological: Negative for dizziness, weakness, light-headedness and headaches.  Psychiatric/Behavioral: The patient is not nervous/anxious.     Allergies  Review of patient's allergies indicates no known allergies.  Home Medications   Current Outpatient Rx  Name Route Sig Dispense Refill  . ALBUTEROL SULFATE HFA 108 (90 BASE) MCG/ACT IN AERS Inhalation Inhale 2 puffs into the lungs every 6 (six) hours as needed. For shortness of breath.    . CETIRIZINE HCL 10 MG PO TABS Oral Take 10 mg by mouth daily.    . IBUPROFEN 200 MG PO TABS Oral Take 400 mg by mouth every 6 (six) hours as needed. For headaches.    Marland Kitchen METOCLOPRAMIDE HCL 10 MG PO TABS Oral Take 1 tablet (10 mg total) by mouth every 6 (six) hours as needed (nausea). 30 tablet 0  . CITRANATAL HARMONY 29-1-265 MG PO CAPS      . PRENATAL MULTIVITAMIN CH Oral Take 1 tablet by mouth daily. Patient has prescription but has not started yet    . PROMETHAZINE HCL 25 MG PO TABS Oral Take 1 tablet (25 mg total) by mouth every 6 (six) hours as needed for nausea. 30 tablet 0  . TINIDAZOLE 500 MG PO TABS        BP 132/81  Pulse  95  Temp 97.6 F (36.4 C) (Oral)  Resp 24  SpO2 100%  LMP 05/20/2011  Physical Exam  Nursing note and vitals reviewed. Constitutional: She is oriented to person, place, and time. She appears well-developed and well-nourished. No distress.  HENT:  Head: Normocephalic and atraumatic.  Mouth/Throat: Oropharynx is clear and moist.  Eyes: Conjunctivae normal are normal.  Neck: Normal range of motion. Neck supple.  Cardiovascular: Normal rate, regular rhythm, normal heart sounds and intact distal pulses.   Pulmonary/Chest: Effort normal and breath sounds normal.  Abdominal: Bowel sounds are normal. There is no tenderness. There is no  CVA tenderness.  Genitourinary: There is no rash or tenderness on the right labia. There is no rash or tenderness on the left labia. Uterus is enlarged. Uterus is not tender. Cervix exhibits discharge (thick, white and malodorous). Cervix exhibits no motion tenderness and no friability. Right adnexum displays no tenderness. Left adnexum displays no tenderness. No erythema, tenderness or bleeding around the vagina. No signs of injury around the vagina. Vaginal discharge (thick, white and malodorous) found.  Musculoskeletal: Normal range of motion. She exhibits no edema.  Neurological: She is oriented to person, place, and time.  Skin: Skin is warm and dry. No rash noted. She is not diaphoretic.  Psychiatric: She has a normal mood and affect. Her behavior is normal.    ED Course  Procedures (including critical care time)   Labs Reviewed  WET PREP, GENITAL  URINALYSIS, ROUTINE W REFLEX MICROSCOPIC  URINE CULTURE   Results for orders placed during the hospital encounter of 12/01/11  WET PREP, GENITAL      Component Value Range   Yeast Wet Prep HPF POC NONE SEEN  NONE SEEN   Trich, Wet Prep NONE SEEN  NONE SEEN   Clue Cells Wet Prep HPF POC FEW (*) NONE SEEN   WBC, Wet Prep HPF POC MANY (*) NONE SEEN  URINALYSIS, ROUTINE W REFLEX MICROSCOPIC      Component Value Range   Color, Urine YELLOW  YELLOW   APPearance CLOUDY (*) CLEAR   Specific Gravity, Urine 1.012  1.005 - 1.030   pH 7.0  5.0 - 8.0   Glucose, UA NEGATIVE  NEGATIVE mg/dL   Hgb urine dipstick NEGATIVE  NEGATIVE   Bilirubin Urine NEGATIVE  NEGATIVE   Ketones, ur NEGATIVE  NEGATIVE mg/dL   Protein, ur NEGATIVE  NEGATIVE mg/dL   Urobilinogen, UA 0.2  0.0 - 1.0 mg/dL   Nitrite NEGATIVE  NEGATIVE   Leukocytes, UA MODERATE (*) NEGATIVE  URINE MICROSCOPIC-ADD ON      Component Value Range   Squamous Epithelial / LPF MANY (*) RARE   WBC, UA 3-6  <3 WBC/hpf   RBC / HPF 0-2  <3 RBC/hpf   Bacteria, UA FEW (*) RARE    US Ob Comp  + 14 Wk  12/01/2011  *RADIOLOGY REPORT*  Clinical Data: Vaginal pain  LIMITED OBSTETRIC ULTRASOUND  Number of Fetuses: 1 Heart Rate: 133 bpm Movement: Seen Presentation: Cephalic Placental Location: Posterior Previa: No Amniotic Fluid (Subjective): Normal  Vertical pocket:  cm    AFI: 15.7 cm (5%ile  cm, 95%ile  cm)  BPD: 7.0cm   28w   2d   EDC: 02/21/2012  MATERNAL FINDINGS: Cervix: 3.2 cm, measured transabdominally Uterus/Adnexae: Not visualized  IMPRESSION: Single living intrauterine gestation demonstrating an EGA by BPD alone of 28w 2d. This corresponds with assigned EGA of 28w 4d and corresponding EDD of 02/19/2012.  Subjectively and quantitatively normal  amniotic fluid volume and normal tranabdominal appearance of the cervix.  Recommend followup with non-emergent complete OB 14+ wk US examination for fetal biometric evaluation and anatomic survey if not already performed.   Original Report Authenticated By: Bertha Stakes, M.D.      1. BV (bacterial vaginosis)   2. Vaginal pain   3. Pregnancy       MDM  26 year old female who is 7 months pregnant presenting with vaginal pressure. OB team was called for fetal monitoring, monitor for 20 minutes and states that the babies movements were unremarkable. Ultrasound without any acute abnormality. Discussed recommendations of followup with patient for nonemergent complete ultrasound for fetal biometric evaluation and anatomic survey. She will call her OB/GYN today. I will treat her BV with Flagyl. Case discussed with Dr. Lorenso Courier who agrees with plan of care.        Trevor Mace, PA-C 12/01/11 [phone]

## 2011-12-01 NOTE — ED Notes (Signed)
Pt reporting 7 months pregnant. Denying high risk pregnancy. Today c/o vaginal "pressure". Denies any discharge or bleeding. Last sexual intercourse in march. This is first pregnancy, rapid response ob RN on the way.

## 2011-12-01 NOTE — ED Notes (Signed)
Dr. Lorenso Courier updated on NST readings Reactive NST No CXT noted Ok to D/C fetal monitoring

## 2011-12-01 NOTE — ED Notes (Signed)
No contractions noted on fetal monitoring. Fetal heart rate noted to be 140. Ultrasound notified pt is ready for transport.

## 2011-12-01 NOTE — ED Notes (Signed)
External monitoring applied for NST + FHT asessing for CXT Patient c/o vaginal pressure, no vaginal bleeding or leaking noted

## 2011-12-01 NOTE — ED Notes (Signed)
Patient transported to Ultrasound 

## 2011-12-01 NOTE — ED Notes (Signed)
Called rapid response OB RN. Made aware of patient, pt denying any abdominal pain or bleeding. Will get fetal monitor to bedside.

## 2011-12-01 NOTE — ED Notes (Signed)
Pt reporting vaginal pain "pressure" x 3 days. Pain with walking, sitting. Pt is 7 months pregnant. Denying any discharge or bleeding. Reporting back pain. Denying any n/v, fever or chills. PT a x 4. Denying any cp or sob.

## 2011-12-02 NOTE — ED Provider Notes (Signed)
Medical screening examination/treatment/procedure(s) were performed by non-physician practitioner and as supervising physician I was immediately available for consultation/collaboration.  Tobin Chad, MD 12/02/11 [phone]

## 2011-12-05 ENCOUNTER — Other Ambulatory Visit: Payer: Self-pay | Admitting: Obstetrics

## 2011-12-05 DIAGNOSIS — Z09 Encounter for follow-up examination after completed treatment for conditions other than malignant neoplasm: Secondary | ICD-10-CM

## 2011-12-08 ENCOUNTER — Ambulatory Visit (HOSPITAL_COMMUNITY)
Admission: RE | Admit: 2011-12-08 | Discharge: 2011-12-08 | Disposition: A | Discharge status: Home / Self Care | Payer: Medicaid Other | Source: Ambulatory Visit | Attending: Obstetrics | Admitting: Obstetrics

## 2011-12-08 DIAGNOSIS — Z3689 Encounter for other specified antenatal screening: Secondary | ICD-10-CM | POA: Insufficient documentation

## 2011-12-08 DIAGNOSIS — Z09 Encounter for follow-up examination after completed treatment for conditions other than malignant neoplasm: Secondary | ICD-10-CM

## 2012-01-16 LAB — OB RESULTS CONSOLE GBS: GBS: NEGATIVE

## 2012-02-15 ENCOUNTER — Encounter (HOSPITAL_COMMUNITY): Payer: Self-pay | Admitting: *Deleted

## 2012-02-15 ENCOUNTER — Emergency Department (HOSPITAL_COMMUNITY)
Admission: EM | Admit: 2012-02-15 | Discharge: 2012-02-15 | Disposition: A | Discharge status: Home / Self Care | Payer: Medicaid Other | Attending: Emergency Medicine | Admitting: Emergency Medicine

## 2012-02-15 DIAGNOSIS — M25531 Pain in right wrist: Secondary | ICD-10-CM

## 2012-02-15 DIAGNOSIS — Z79899 Other long term (current) drug therapy: Secondary | ICD-10-CM | POA: Insufficient documentation

## 2012-02-15 DIAGNOSIS — O99891 Other specified diseases and conditions complicating pregnancy: Secondary | ICD-10-CM | POA: Insufficient documentation

## 2012-02-15 DIAGNOSIS — M25539 Pain in unspecified wrist: Secondary | ICD-10-CM | POA: Insufficient documentation

## 2012-02-15 MED ORDER — ACETAMINOPHEN 325 MG PO TABS
650.0000 mg | ORAL_TABLET | Freq: Once | ORAL | Status: AC
  Administered 2012-02-15: 650 mg via ORAL
  Filled 2012-02-15: qty 2

## 2012-02-15 NOTE — Progress Notes (Signed)
Orthopedic Tech Progress Note Patient Details:  Lisa Charles March 11, 1985 [id number]  Ortho Devices Type of Ortho Device: Wrist splint Ortho Device/Splint Location: left wrist splint Ortho Device/Splint Interventions: Application   Cammer, Mickie Bail 02/15/2012, 12:50 PM

## 2012-02-15 NOTE — ED Notes (Signed)
Ortho paged. Pt made aware of plan of care.

## 2012-02-15 NOTE — ED Provider Notes (Signed)
History   This chart was scribed for Lisa Canal, MD by Toya Smothers, ED Scribe. The patient was seen in room TR10C/TR10C. Patient's care was started at 1043.  CSN: [id number]  Arrival date & time 02/15/12  1043   First MD Initiated Contact with Patient 02/15/12 1220      Chief Complaint  Patient presents with  . Wrist Pain    HPI  Lisa Charles is a 26 y.o. female who is [redacted] weeks pregnant, presents to the Emergency Department complaining of 3 days of new, constant, unchanged, moderate bilateral wrist pain without trauma or falls. Pain is worse in the left, aggravated with ROM, and alleviated by nothing. Symptoms have not been treated PTA. No fever, chills, cough, congestion, rhinorrhea, chest pain, SOB, or n/v/d. Pt denies use of tobacco products, consumption of alcohol, and use of illicit drugs.  Past Medical History  Diagnosis Date  . No pertinent past medical history     Past Surgical History  Procedure Date  . No past surgeries     Family History  Problem Relation Age of Onset  . Diabetes Mother   . Hypertension Mother   . Hyperlipidemia Mother   . Gout Father     History  Substance Use Topics  . Smoking status: Never Smoker   . Smokeless tobacco: Not on file  . Alcohol Use: No    OB History    Grav Para Term Preterm Abortions TAB SAB Ect Mult Living   1 0 0 0 0 0 0 0 0 0       Review of Systems  All other systems reviewed and are negative.    Allergies  Review of patient's allergies indicates no known allergies.  Home Medications   Current Outpatient Rx  Name  Route  Sig  Dispense  Refill  . VITAMIN D 1000 UNITS PO TABS   Oral   Take 1,000 Units by mouth daily.         Marland Kitchen CITRANATAL HARMONY 29-1-265 MG PO CAPS               . ALBUTEROL SULFATE HFA 108 (90 BASE) MCG/ACT IN AERS   Inhalation   Inhale 2 puffs into the lungs every 6 (six) hours as needed. For shortness of breath.           BP 132/80  Pulse 76  Temp 97.4 F (36.3 C)  (Oral)  Resp 18  SpO2 100%  LMP 05/20/2011  Physical Exam  Constitutional: She is oriented to person, place, and time. She appears well-developed and well-nourished.  HENT:  Head: Normocephalic.  Mouth/Throat: Oropharynx is clear and moist.  Eyes: Conjunctivae normal are normal. Pupils are equal, round, and reactive to light.  Neck: Normal range of motion.  Cardiovascular: Normal rate.   Pulmonary/Chest: Effort normal.  Abdominal: Soft.       Gravid uterus, nontender  Musculoskeletal: Normal range of motion.       Normal ROM of bilateral wrists. No tenderness. 2+ pulses. Neurovascular exam intact  Neurological: She is alert and oriented to person, place, and time.  Skin: Skin is warm.  Psychiatric: She has a normal mood and affect. Her behavior is normal. Judgment and thought content normal.    ED Course  Procedures DIAGNOSTIC STUDIES: Oxygen Saturation is 100% on room air, normal by my interpretation.    COORDINATION OF CARE: 12:33- Evaluated Pt. Pt is awake, alert, and without distress. 12:35- Patient informed of clinical course, understand medical decision-making  process, and agree with plan. Will prepare Pt for discharge.      Labs Reviewed - No data to display No results found.   No diagnosis found.    MDM  JISSELL TRAFTON is a 26 y.o. female here with bilateral wrist pain, worse on L. I counseled her that she likely strained her wrist. Recommend no heavy lifting, pain control with tylenol (not motrin), using splint for comfort, and follow up with ortho after delivery if symptoms persists.   I personally performed the services described in this documentation, which was scribed in my presence. The recorded information has been reviewed and is accurate.   Lisa Canal, MD 02/15/12 [phone]

## 2012-02-15 NOTE — ED Notes (Signed)
Pt is here with bilateral wrist pain without injury.  Pt is 39 weeks, no abdominal pain or issues.

## 2012-02-16 ENCOUNTER — Encounter (HOSPITAL_COMMUNITY): Payer: Self-pay | Admitting: *Deleted

## 2012-02-16 ENCOUNTER — Inpatient Hospital Stay (HOSPITAL_COMMUNITY)
Admission: AD | Admit: 2012-02-16 | Discharge: 2012-02-16 | Disposition: A | Discharge status: Home / Self Care | Payer: Medicaid Other | Source: Ambulatory Visit | Attending: Obstetrics | Admitting: Obstetrics

## 2012-02-16 DIAGNOSIS — O139 Gestational [pregnancy-induced] hypertension without significant proteinuria, unspecified trimester: Secondary | ICD-10-CM | POA: Insufficient documentation

## 2012-02-16 DIAGNOSIS — R51 Headache: Secondary | ICD-10-CM | POA: Insufficient documentation

## 2012-02-16 LAB — URIC ACID: Uric Acid, Serum: 4.6 mg/dL (ref 2.4–7.0)

## 2012-02-16 LAB — CBC
HCT: 35.5 % — ABNORMAL LOW (ref 36.0–46.0)
Hemoglobin: 11.8 g/dL — ABNORMAL LOW (ref 12.0–15.0)
MCH: 28.7 pg (ref 26.0–34.0)
MCHC: 33.2 g/dL (ref 30.0–36.0)
RDW: 14.8 % (ref 11.5–15.5)

## 2012-02-16 LAB — PROTEIN / CREATININE RATIO, URINE
Creatinine, Urine: 62.76 mg/dL
Protein Creatinine Ratio: 0.3 — ABNORMAL HIGH (ref 0.00–0.15)
Total Protein, Urine: 19 mg/dL

## 2012-02-16 LAB — COMPREHENSIVE METABOLIC PANEL
BUN: 6 mg/dL (ref 6–23)
Calcium: 9.8 mg/dL (ref 8.4–10.5)
GFR calc Af Amer: >90 mL/min (ref >90)
Glucose, Bld: 80 mg/dL (ref 70–99)
Total Protein: 7.3 g/dL (ref 6.0–8.3)

## 2012-02-16 NOTE — MAU Provider Note (Signed)
History     CSN: [id number]  Arrival date and time: 02/16/12 1644   First Provider Initiated Contact with Patient 02/16/12 1753      No chief complaint on file.  HPI This is a 26 y.o. female at [redacted]w[redacted]d who presents from office for evaluation of elevated BPs. Does report some headaches, but denies abdominal pain or visual changes.   OB History    Grav Para Term Preterm Abortions TAB SAB Ect Mult Living   1 0 0 0 0 0 0 0 0 0       Past Medical History  Diagnosis Date  . No pertinent past medical history     Past Surgical History  Procedure Date  . No past surgeries     Family History  Problem Relation Age of Onset  . Diabetes Mother   . Hypertension Mother   . Hyperlipidemia Mother   . Gout Father     History  Substance Use Topics  . Smoking status: Never Smoker   . Smokeless tobacco: Not on file  . Alcohol Use: No    Allergies: No Known Allergies  Prescriptions prior to admission  Medication Sig Dispense Refill  . albuterol (PROVENTIL HFA;VENTOLIN HFA) 108 (90 BASE) MCG/ACT inhaler Inhale 2 puffs into the lungs every 6 (six) hours as needed. For shortness of breath.      . cholecalciferol (VITAMIN D) 1000 UNITS tablet Take 1,000 Units by mouth daily.      Burnis Medin w/o [id number] (CITRANATAL HARMONY) 29-1-265 MG CAPS         ROS See HPI  Physical Exam   Blood pressure 153/87, pulse 74, last menstrual period 05/20/2011. Filed Vitals:   02/16/12 1801 02/16/12 1816 02/16/12 1831 02/16/12 1846  BP: 148/75 144/86 132/69 143/74  Pulse: 76 77 76 77    Physical Exam  Constitutional: She is oriented to person, place, and time. She appears well-developed and well-nourished. No distress.  HENT:  Head: Normocephalic.  Cardiovascular: Normal rate.   Respiratory: Effort normal.  GI: Soft.  Musculoskeletal: Normal range of motion. She exhibits edema (trace pedal).  Neurological: She is alert and oriented to person, place, and time. She has normal  reflexes.  Skin: Skin is warm and dry.  Psychiatric: She has a normal mood and affect.   FHR reactive Rare contractions  Results for orders placed during the hospital encounter of 02/16/12 (from the past 24 hour(s))  CBC     Status: Abnormal   Collection Time   02/16/12  5:40 PM      Component Value Range   WBC 11.7 (*) 4.0 - 10.5 K/uL   RBC 4.11  3.87 - 5.11 MIL/uL   Hemoglobin 11.8 (*) 12.0 - 15.0 g/dL   HCT 56.2 (*) 13.0 - 86.5 %   MCV 86.4  78.0 - 100.0 fL   MCH 28.7  26.0 - 34.0 pg   MCHC 33.2  30.0 - 36.0 g/dL   RDW 78.4  69.6 - 29.5 %   Platelets 198  150 - 400 K/uL  COMPREHENSIVE METABOLIC PANEL     Status: Abnormal   Collection Time   02/16/12  5:40 PM      Component Value Range   Sodium 134 (*) 135 - 145 mEq/L   Potassium 3.8  3.5 - 5.1 mEq/L   Chloride 100  96 - 112 mEq/L   CO2 21  19 - 32 mEq/L   Glucose, Bld 80  70 - 99 mg/dL   BUN 6  6 - 23 mg/dL   Creatinine, Ser 1.61  0.50 - 1.10 mg/dL   Calcium 9.8  8.4 - 09.6 mg/dL   Total Protein 7.3  6.0 - 8.3 g/dL   Albumin 3.1 (*) 3.5 - 5.2 g/dL   AST 29  0 - 37 U/L   ALT 22  0 - 35 U/L   Alkaline Phosphatase 190 (*) 39 - 117 U/L   Total Bilirubin 0.3  0.3 - 1.2 mg/dL   GFR calc non Af Amer >90  >90 mL/min   GFR calc Af Amer >90  >90 mL/min  URIC ACID     Status: Normal   Collection Time   02/16/12  5:40 PM      Component Value Range   Uric Acid, Serum 4.6  2.4 - 7.0 mg/dL  LACTATE DEHYDROGENASE     Status: Normal   Collection Time   02/16/12  5:40 PM      Component Value Range   LDH 182  94 - 250 U/L   Protein/Creat Ratio:  0.30  MAU Course  Procedures Results reviewed. Dr Clearance Coots paged at 2003hrs. >. Will send home to do 24 hr urine  Assessment and Plan  A:  [facility] at [redacted]w[redacted]d       Gestational hypertension      Elevated PR/CR ratio  P:  DIscharge home      24 hr urine to be turned in at their office Monday      Preeclampsia warnings reviewed.   [facility] 02/16/2012, 5:57 PM

## 2012-02-16 NOTE — MAU Note (Signed)
Pt states she was seen in the office and was told to come over by MD

## 2012-02-20 ENCOUNTER — Inpatient Hospital Stay (HOSPITAL_COMMUNITY)
Admission: AD | Admit: 2012-02-20 | Discharge: 2012-02-22 | DRG: 774 | Disposition: A | Discharge status: Home / Self Care | Payer: Medicaid Other | Source: Ambulatory Visit | Attending: Obstetrics | Admitting: Obstetrics

## 2012-02-20 ENCOUNTER — Inpatient Hospital Stay (HOSPITAL_COMMUNITY): Payer: Medicaid Other | Admitting: Anesthesiology

## 2012-02-20 ENCOUNTER — Encounter (HOSPITAL_COMMUNITY): Payer: Self-pay

## 2012-02-20 ENCOUNTER — Encounter (HOSPITAL_COMMUNITY): Payer: Self-pay | Admitting: *Deleted

## 2012-02-20 ENCOUNTER — Encounter (HOSPITAL_COMMUNITY): Payer: Self-pay | Admitting: Anesthesiology

## 2012-02-20 DIAGNOSIS — IMO0001 Reserved for inherently not codable concepts without codable children: Secondary | ICD-10-CM

## 2012-02-20 DIAGNOSIS — O09299 Supervision of pregnancy with other poor reproductive or obstetric history, unspecified trimester: Secondary | ICD-10-CM | POA: Diagnosis present

## 2012-02-20 DIAGNOSIS — IMO0002 Reserved for concepts with insufficient information to code with codable children: Secondary | ICD-10-CM | POA: Diagnosis present

## 2012-02-20 HISTORY — DX: Essential (primary) hypertension: I10

## 2012-02-20 LAB — CBC
HCT: 34.8 % — ABNORMAL LOW (ref 36.0–46.0)
HCT: 35.7 % — ABNORMAL LOW (ref 36.0–46.0)
Hemoglobin: 12.1 g/dL (ref 12.0–15.0)
MCH: 29 pg (ref 26.0–34.0)
MCH: 29.3 pg (ref 26.0–34.0)
MCHC: 33.9 g/dL (ref 30.0–36.0)
MCV: 86.1 fL (ref 78.0–100.0)
MCV: 86.4 fL (ref 78.0–100.0)
Platelets: 193 10*3/uL (ref 150–400)
RBC: 4.04 MIL/uL (ref 3.87–5.11)
RDW: 14.7 % (ref 11.5–15.5)

## 2012-02-20 LAB — PROTEIN / CREATININE RATIO, URINE
Creatinine, Urine: 86.45 mg/dL
Protein Creatinine Ratio: 0.18 — ABNORMAL HIGH (ref 0.00–0.15)
Total Protein, Urine: 15.4 mg/dL

## 2012-02-20 LAB — COMPREHENSIVE METABOLIC PANEL
AST: 31 U/L (ref 0–37)
BUN: 10 mg/dL (ref 6–23)
CO2: 20 mEq/L (ref 19–32)
Calcium: 9.7 mg/dL (ref 8.4–10.5)
Creatinine, Ser: 0.57 mg/dL (ref 0.50–1.10)
GFR calc non Af Amer: >90 mL/min (ref >90)
Total Bilirubin: 0.3 mg/dL (ref 0.3–1.2)

## 2012-02-20 LAB — TYPE AND SCREEN

## 2012-02-20 MED ORDER — FENTANYL 2.5 MCG/ML BUPIVACAINE 1/10 % EPIDURAL INFUSION (WH - ANES)
14.0000 mL/h | INTRAMUSCULAR | Status: DC
  Filled 2012-02-20: qty 125

## 2012-02-20 MED ORDER — LACTATED RINGERS IV SOLN: INTRAVENOUS | Status: DC

## 2012-02-20 MED ORDER — LACTATED RINGERS IV SOLN
500.0000 mL | Freq: Once | INTRAVENOUS | Status: AC
  Administered 2012-02-20: 1000 mL via INTRAVENOUS

## 2012-02-20 MED ORDER — OXYTOCIN 40 UNITS IN LACTATED RINGERS INFUSION - SIMPLE MED
62.5000 mL/h | INTRAVENOUS | Status: DC
  Administered 2012-02-20: 999 mL/h via INTRAVENOUS
  Filled 2012-02-20: qty 1000

## 2012-02-20 MED ORDER — EPHEDRINE 5 MG/ML INJ: 10.0000 mg | INTRAVENOUS | Status: DC | PRN

## 2012-02-20 MED ORDER — OXYCODONE-ACETAMINOPHEN 5-325 MG PO TABS: 1.0000 | ORAL_TABLET | ORAL | Status: DC | PRN

## 2012-02-20 MED ORDER — EPHEDRINE 5 MG/ML INJ
10.0000 mg | INTRAVENOUS | Status: DC | PRN
  Filled 2012-02-20: qty 4

## 2012-02-20 MED ORDER — FENTANYL 2.5 MCG/ML BUPIVACAINE 1/10 % EPIDURAL INFUSION (WH - ANES)
INTRAMUSCULAR | Status: DC | PRN
  Administered 2012-02-20: 14 mL/h via EPIDURAL

## 2012-02-20 MED ORDER — DIPHENHYDRAMINE HCL 25 MG PO CAPS: 25.0000 mg | ORAL_CAPSULE | Freq: Four times a day (QID) | ORAL | Status: DC | PRN

## 2012-02-20 MED ORDER — ONDANSETRON HCL 4 MG/2ML IJ SOLN: 4.0000 mg | Freq: Four times a day (QID) | INTRAMUSCULAR | Status: DC | PRN

## 2012-02-20 MED ORDER — BENZOCAINE-MENTHOL 20-0.5 % EX AERO
1.0000 "application " | INHALATION_SPRAY | CUTANEOUS | Status: DC | PRN
  Filled 2012-02-20 (×2): qty 56

## 2012-02-20 MED ORDER — ALBUTEROL SULFATE HFA 108 (90 BASE) MCG/ACT IN AERS
2.0000 | INHALATION_SPRAY | Freq: Four times a day (QID) | RESPIRATORY_TRACT | Status: DC | PRN
  Filled 2012-02-20: qty 6.7

## 2012-02-20 MED ORDER — ACETAMINOPHEN 325 MG PO TABS: 650.0000 mg | ORAL_TABLET | ORAL | Status: DC | PRN

## 2012-02-20 MED ORDER — SENNOSIDES-DOCUSATE SODIUM 8.6-50 MG PO TABS
2.0000 | ORAL_TABLET | Freq: Every day | ORAL | Status: DC
  Administered 2012-02-20: 2 via ORAL

## 2012-02-20 MED ORDER — CITRIC ACID-SODIUM CITRATE 334-500 MG/5ML PO SOLN: 30.0000 mL | ORAL | Status: DC | PRN

## 2012-02-20 MED ORDER — PHENYLEPHRINE 40 MCG/ML (10ML) SYRINGE FOR IV PUSH (FOR BLOOD PRESSURE SUPPORT): 80.0000 ug | PREFILLED_SYRINGE | INTRAVENOUS | Status: DC | PRN

## 2012-02-20 MED ORDER — LIDOCAINE HCL (PF) 1 % IJ SOLN
INTRAMUSCULAR | Status: DC | PRN
  Administered 2012-02-20 (×2): 4 mL
  Administered 2012-02-20: 30 mL

## 2012-02-20 MED ORDER — LANOLIN HYDROUS EX OINT: TOPICAL_OINTMENT | CUTANEOUS | Status: DC | PRN

## 2012-02-20 MED ORDER — OXYTOCIN BOLUS FROM INFUSION: 500.0000 mL | INTRAVENOUS | Status: DC

## 2012-02-20 MED ORDER — PHENYLEPHRINE 40 MCG/ML (10ML) SYRINGE FOR IV PUSH (FOR BLOOD PRESSURE SUPPORT)
80.0000 ug | PREFILLED_SYRINGE | INTRAVENOUS | Status: DC | PRN
  Filled 2012-02-20: qty 5

## 2012-02-20 MED ORDER — BUTORPHANOL TARTRATE 1 MG/ML IJ SOLN
2.0000 mg | INTRAMUSCULAR | Status: DC | PRN
  Administered 2012-02-20: 2 mg via INTRAVENOUS
  Filled 2012-02-20: qty 2

## 2012-02-20 MED ORDER — MEASLES, MUMPS & RUBELLA VAC ~~LOC~~ INJ
0.5000 mL | INJECTION | Freq: Once | SUBCUTANEOUS | Status: DC
  Filled 2012-02-20: qty 0.5

## 2012-02-20 MED ORDER — DIPHENHYDRAMINE HCL 50 MG/ML IJ SOLN: 12.5000 mg | INTRAMUSCULAR | Status: DC | PRN

## 2012-02-20 MED ORDER — FERROUS SULFATE 325 (65 FE) MG PO TABS
325.0000 mg | ORAL_TABLET | Freq: Two times a day (BID) | ORAL | Status: DC
  Administered 2012-02-20 – 2012-02-22 (×4): 325 mg via ORAL
  Filled 2012-02-20 (×6): qty 1

## 2012-02-20 MED ORDER — ONDANSETRON HCL 4 MG/2ML IJ SOLN: 4.0000 mg | INTRAMUSCULAR | Status: DC | PRN

## 2012-02-20 MED ORDER — LIDOCAINE HCL (PF) 1 % IJ SOLN
30.0000 mL | INTRAMUSCULAR | Status: DC | PRN
  Filled 2012-02-20: qty 30

## 2012-02-20 MED ORDER — PRENATAL MULTIVITAMIN CH
1.0000 | ORAL_TABLET | Freq: Every day | ORAL | Status: DC
  Administered 2012-02-20 – 2012-02-22 (×3): 1 via ORAL
  Filled 2012-02-20 (×3): qty 1

## 2012-02-20 MED ORDER — IBUPROFEN 600 MG PO TABS
600.0000 mg | ORAL_TABLET | Freq: Four times a day (QID) | ORAL | Status: DC
  Administered 2012-02-20 – 2012-02-22 (×8): 600 mg via ORAL
  Filled 2012-02-20 (×8): qty 1

## 2012-02-20 MED ORDER — ONDANSETRON HCL 4 MG PO TABS: 4.0000 mg | ORAL_TABLET | ORAL | Status: DC | PRN

## 2012-02-20 MED ORDER — WITCH HAZEL-GLYCERIN EX PADS: 1.0000 "application " | MEDICATED_PAD | CUTANEOUS | Status: DC | PRN

## 2012-02-20 MED ORDER — MAGNESIUM HYDROXIDE 400 MG/5ML PO SUSP: 30.0000 mL | ORAL | Status: DC | PRN

## 2012-02-20 MED ORDER — DIBUCAINE 1 % RE OINT
1.0000 "application " | TOPICAL_OINTMENT | RECTAL | Status: DC | PRN
  Filled 2012-02-20: qty 28

## 2012-02-20 MED ORDER — LACTATED RINGERS IV SOLN
500.0000 mL | INTRAVENOUS | Status: DC | PRN
  Administered 2012-02-20: 1000 mL via INTRAVENOUS

## 2012-02-20 MED ORDER — TETANUS-DIPHTH-ACELL PERTUSSIS 5-2.5-18.5 LF-MCG/0.5 IM SUSP
0.5000 mL | Freq: Once | INTRAMUSCULAR | Status: AC
  Administered 2012-02-21: 0.5 mL via INTRAMUSCULAR
  Filled 2012-02-20: qty 0.5

## 2012-02-20 MED ORDER — IBUPROFEN 600 MG PO TABS: 600.0000 mg | ORAL_TABLET | Freq: Four times a day (QID) | ORAL | Status: DC | PRN

## 2012-02-20 MED ORDER — OXYCODONE-ACETAMINOPHEN 5-325 MG PO TABS
1.0000 | ORAL_TABLET | ORAL | Status: DC | PRN
  Administered 2012-02-22: 1 via ORAL
  Filled 2012-02-20: qty 1

## 2012-02-20 MED ORDER — ONDANSETRON HCL 4 MG/2ML IJ SOLN
4.0000 mg | Freq: Once | INTRAMUSCULAR | Status: AC
  Administered 2012-02-20: 4 mg via INTRAVENOUS
  Filled 2012-02-20: qty 2

## 2012-02-20 MED ORDER — ZOLPIDEM TARTRATE 5 MG PO TABS: 5.0000 mg | ORAL_TABLET | Freq: Every evening | ORAL | Status: DC | PRN

## 2012-02-20 NOTE — Anesthesia Procedure Notes (Signed)
Epidural Patient location during procedure: OB Start time: 02/20/2012 12:28 PM  Staffing Anesthesiologist: Shaivi Rothschild A. Performed by: anesthesiologist   Preanesthetic Checklist Completed: patient identified, site marked, surgical consent, pre-op evaluation, timeout performed, IV checked, risks and benefits discussed and monitors and equipment checked  Epidural Patient position: sitting Prep: site prepped and draped and DuraPrep Patient monitoring: continuous pulse ox and blood pressure Approach: midline Injection technique: LOR air  Needle:  Needle type: Tuohy  Needle gauge: 17 G Needle length: 9 cm and 9 Needle insertion depth: 7 cm Catheter type: closed end flexible Catheter size: 19 Gauge Catheter at skin depth: 12 cm Test dose: negative and Other  Assessment Events: blood not aspirated, injection not painful, no injection resistance, negative IV test and no paresthesia  Additional Notes Patient identified. Risks and benefits discussed including failed block, incomplete  Pain control, post dural puncture headache, nerve damage, paralysis, blood pressure Changes, nausea, vomiting, reactions to medications-both toxic and allergic and post Partum back pain. All questions were answered. Patient expressed understanding and wished to proceed. Sterile technique was used throughout procedure. Epidural site was Dressed with sterile barrier dressing. No paresthesias, signs of intravascular injection Or signs of intrathecal spread were encountered.  Patient was more comfortable after the epidural was dosed. Please see RN's note for documentation of vital signs and FHR which are stable.

## 2012-02-20 NOTE — Anesthesia Preprocedure Evaluation (Signed)
Anesthesia Evaluation  Patient identified by MRN, date of birth, ID band Patient awake    Reviewed: Allergy & Precautions, H&P , Patient's Chart, lab work & pertinent test results  Airway Mallampati: III TM Distance: >3 FB Neck ROM: full    Dental No notable dental hx. (+) Teeth Intact   Pulmonary  Reactive Airway breath sounds clear to auscultation  Pulmonary exam normal       Cardiovascular hypertension, negative cardio ROS  Rhythm:regular Rate:Normal     Neuro/Psych negative neurological ROS  negative psych ROS   GI/Hepatic negative GI ROS, Neg liver ROS,   Endo/Other  Morbid obesity  Renal/GU negative Renal ROS  negative genitourinary   Musculoskeletal   Abdominal Normal abdominal exam  (+)   Peds  Hematology negative hematology ROS (+)   Anesthesia Other Findings   Reproductive/Obstetrics (+) Pregnancy                           Anesthesia Physical Anesthesia Plan  ASA: III  Anesthesia Plan: Epidural   Post-op Pain Management:    Induction:   Airway Management Planned:   Additional Equipment:   Intra-op Plan:   Post-operative Plan:   Informed Consent: I have reviewed the patients History and Physical, chart, labs and discussed the procedure including the risks, benefits and alternatives for the proposed anesthesia with the patient or authorized representative who has indicated his/her understanding and acceptance.     Plan Discussed with: Anesthesiologist  Anesthesia Plan Comments:         Anesthesia Quick Evaluation

## 2012-02-20 NOTE — Anesthesia Postprocedure Evaluation (Signed)
  Anesthesia Post-op Note  Patient: Lisa Charles  Procedure(s) Performed: * No procedures listed *  Patient Location: Mother/Baby  Anesthesia Type:Epidural  Level of Consciousness: awake, alert  and oriented  Airway and Oxygen Therapy: Patient Spontanous Breathing  Post-op Pain: none  Post-op Assessment: Post-op Vital signs reviewed, Patient's Cardiovascular Status Stable, No headache, No backache, No residual numbness and No residual motor weakness  Post-op Vital Signs: Reviewed and stable  Complications: No apparent anesthesia complications

## 2012-02-20 NOTE — MAU Provider Note (Signed)
HPI: Lisa Charles is a 26 y.o. year old [id number] female at [redacted]w[redacted]d weeks gestation who presents to MAU reporting contractions and small amount of bleeding noted when wiping. BP elevated.  Denies HA, vision changes or epigastric pain. In process of collecting 24 hour urine, but did not bring with her and has voided in MAU (not collected).  Dilation: 3 Effacement (%): 80 Station: -2 Presentation: Vertex Exam by:: Ivonne Andrew, CNM  FHR 120's category I Toco: Q 3-8, mild-moderate  Per Dr. Gaynell Face draw [facility] labs PC ratio. Pt may ambulate x 1 hour. Call Dr. Clearance Coots w/ results, VE.  0710: Minimal change in [place]. CBC, CMET normal. PC ratio pending. Admit per Dr. Tamela Oddi.  [place], CNM 02/20/2012 7:53 AM

## 2012-02-20 NOTE — H&P (Signed)
Lisa Charles is Charles 26 y.o. female presenting for contractions. Maternal Medical History:  Reason for admission: Reason for admission: contractions.  The patient had presented to [facility]U several days ago and was found to have elevated BPs.    Contractions: Frequency: regular.   Perceived severity is strong.    Fetal activity: Perceived fetal activity is normal.    Prenatal complications: Infection: Trichomonas.     OB History    Grav Para Term Preterm Abortions TAB SAB Ect Mult Living   1 0 0 0 0 0 0 0 0 0      Past Medical History  Diagnosis Date  . No pertinent past medical history   . Hypertension    Past Surgical History  Procedure Date  . No past surgeries    Family History: family history includes Diabetes in her mother; Gout in her father; Hyperlipidemia in her mother; and Hypertension in her mother. Social History:  reports that she has never smoked. She does not have any smokeless tobacco history on file. She reports that she does not drink alcohol or use illicit drugs.    Review of Systems  Constitutional: Negative for fever.  Eyes: Negative for blurred vision.  Respiratory: Negative for shortness of breath.   Gastrointestinal: Negative for vomiting.  Skin: Negative for rash.  Neurological: Negative.  Negative for headaches.    Dilation: 10 Effacement (%): 100 Station: +2 Exam by:: S Earl RN Blood pressure 146/63, pulse 91, temperature 98 F (36.7 C), temperature source Oral, resp. rate 18, height 5' 7.5" (1.715 m), weight 130.636 kg (288 lb), last menstrual period 05/20/2011, SpO2 85.00%. Maternal Exam:  Uterine Assessment: Contraction frequency is regular.   Abdomen: Fetal presentation: vertex  Introitus: not evaluated.   Cervix: Cervix evaluated by digital exam.     Fetal Exam Fetal Monitor Review: Variability: moderate (6-25 bpm).   Pattern: accelerations present and no decelerations.    Fetal State Assessment: Category I - tracings are  normal.     Physical Exam  Constitutional: She appears well-developed.  HENT:  Head: Normocephalic.  Neck: Neck supple. No thyromegaly present.  Cardiovascular: Normal rate and regular rhythm.   Respiratory: Breath sounds normal.  GI: Soft. Bowel sounds are normal.  Skin: No rash noted.    Prenatal labs: ABO, Rh: --/--/Charles POS (12/16 0935) Antibody: NEG (12/16 0935) Rubella:   RPR:    HBsAg:    HIV:    GBS:     Assessment/Plan: [place] @ [redacted]w[redacted]d.  Preeclampsia/ in labor.  Category I FHT  Admit PIH labs Anticipate an NSVD   Lisa Charles 02/20/2012, 2:05 PM

## 2012-02-20 NOTE — Progress Notes (Signed)
Pt moving from side to side in bed.  Difficult to trace continually due to constant motion and maternal size.  Will continue to adjust monitors.

## 2012-02-20 NOTE — MAU Note (Signed)
Pt reports blood on tissue with wiping this am, constant lower abd pain

## 2012-02-21 LAB — HEMOGLOBIN AND HEMATOCRIT, BLOOD: Hemoglobin: 10.1 g/dL — ABNORMAL LOW (ref 12.0–15.0)

## 2012-02-21 MED ORDER — PNEUMOCOCCAL VAC POLYVALENT 25 MCG/0.5ML IJ INJ
0.5000 mL | INJECTION | Freq: Once | INTRAMUSCULAR | Status: AC
  Administered 2012-02-21: 0.5 mL via INTRAMUSCULAR
  Filled 2012-02-21 (×2): qty 0.5

## 2012-02-21 MED ORDER — INFLUENZA VIRUS VACC SPLIT PF IM SUSP
0.5000 mL | Freq: Once | INTRAMUSCULAR | Status: AC
  Administered 2012-02-21: 0.5 mL via INTRAMUSCULAR

## 2012-02-21 NOTE — Progress Notes (Signed)
Post Partum Day 1 Subjective: no complaints  Objective: Blood pressure 109/69, pulse 76, temperature 98 F (36.7 C), temperature source Oral, resp. rate 18, height 5' 7.5" (1.715 m), weight 288 lb (130.636 kg), last menstrual period 05/20/2011, SpO2 85.00%, unknown if currently breastfeeding.  Physical Exam:  General: alert and no distress Lochia: appropriate Uterine Fundus: firm Incision: healing well DVT Evaluation: No evidence of DVT seen on physical exam.   Basename 02/21/12 0530 02/20/12 1125  HGB 10.1* 12.1  HCT 30.1* 35.7*    Assessment/Plan: Plan for discharge tomorrow   LOS: 1 day   HARPER,[name] 02/21/2012, 7:49 AM

## 2012-02-22 MED ORDER — IBUPROFEN 600 MG PO TABS: 600.0000 mg | ORAL_TABLET | Freq: Four times a day (QID) | ORAL | Status: DC | PRN

## 2012-02-22 MED ORDER — OXYCODONE-ACETAMINOPHEN 5-325 MG PO TABS
1.0000 | ORAL_TABLET | ORAL | Status: DC | PRN
Start: 2012-02-22 — End: 2012-06-11

## 2012-02-22 NOTE — Progress Notes (Signed)
Post Partum Day 2 Subjective: no complaints  Objective: Blood pressure 145/67, pulse 75, temperature 98.2 F (36.8 C), temperature source Oral, resp. rate 18, height 5' 7.5" (1.715 m), weight 288 lb (130.636 kg), last menstrual period 05/20/2011, SpO2 100.00%, unknown if currently breastfeeding.  Physical Exam:  General: alert and no distress Lochia: appropriate Uterine Fundus: firm Incision: healing well DVT Evaluation: No evidence of DVT seen on physical exam.   Basename 02/21/12 0530 02/20/12 1125  HGB 10.1* 12.1  HCT 30.1* 35.7*    Assessment/Plan: Discharge home   LOS: 2 days   Tavarus Poteete A 02/22/2012, 5:06 AM

## 2012-02-22 NOTE — Discharge Summary (Signed)
Obstetric Discharge Summary Reason for Admission: onset of labor Prenatal Procedures: none Intrapartum Procedures: spontaneous vaginal delivery Postpartum Procedures: none Complications-Operative and Postpartum: none Hemoglobin  Date Value Range Status  02/21/2012 10.1* 12.0 - 15.0 g/dL Final     HCT  Date Value Range Status  02/21/2012 30.1* 36.0 - 46.0 % Final    Physical Exam:  General: alert and no distress Lochia: appropriate Uterine Fundus: firm Incision: healing well DVT Evaluation: No evidence of DVT seen on physical exam.  Discharge Diagnoses: Term Pregnancy-delivered  Discharge Information: Date: 02/22/2012 Activity: pelvic rest Diet: routine Medications: PNV, Ibuprofen, Colace and Percocet Condition: stable Instructions: refer to practice specific booklet Discharge to: home Follow-up Information    Follow up with Rigby Swamy A, MD. Schedule an appointment as soon as possible for a visit in 6 weeks.   Contact information:   [address] [place] [address] [phone]          Newborn Data: Live born female  Birth Weight: 6 lb 9.5 oz (2990 g) APGAR: 7, 9  Home with mother.  Rilya Longo A 02/22/2012, 5:15 AM

## 2012-06-11 ENCOUNTER — Encounter (HOSPITAL_COMMUNITY): Payer: Self-pay | Admitting: Neurology

## 2012-06-11 ENCOUNTER — Emergency Department (HOSPITAL_COMMUNITY)
Admission: EM | Admit: 2012-06-11 | Discharge: 2012-06-11 | Disposition: A | Discharge status: Home / Self Care | Payer: Medicaid Other | Attending: Emergency Medicine | Admitting: Emergency Medicine

## 2012-06-11 ENCOUNTER — Emergency Department (HOSPITAL_COMMUNITY): Payer: Medicaid Other

## 2012-06-11 DIAGNOSIS — Z79899 Other long term (current) drug therapy: Secondary | ICD-10-CM | POA: Insufficient documentation

## 2012-06-11 DIAGNOSIS — G8929 Other chronic pain: Secondary | ICD-10-CM | POA: Insufficient documentation

## 2012-06-11 DIAGNOSIS — M25539 Pain in unspecified wrist: Secondary | ICD-10-CM | POA: Insufficient documentation

## 2012-06-11 DIAGNOSIS — M2550 Pain in unspecified joint: Secondary | ICD-10-CM | POA: Insufficient documentation

## 2012-06-11 DIAGNOSIS — I10 Essential (primary) hypertension: Secondary | ICD-10-CM | POA: Insufficient documentation

## 2012-06-11 MED ORDER — IBUPROFEN 800 MG PO TABS: 800.0000 mg | ORAL_TABLET | Freq: Three times a day (TID) | ORAL | Status: DC

## 2012-06-11 NOTE — ED Provider Notes (Signed)
History     CSN: [id number]  Arrival date & time 06/11/12  0716   First MD Initiated Contact with Patient 06/11/12 [phone]      Chief Complaint  Patient presents with  . Wrist Pain    (Consider location/radiation/quality/duration/timing/severity/associated sxs/prior treatment) HPI  Lisa Charles is a 27 y.o. female complaining of exacerbation of left wrist pain that she has had for over 6 months. Initially patient was pregnant and was told that the swelling and pain would subside after her pregnancy. Patient then had a slip and fall on outstretched hands about 3 months ago which has exacerbated the pain. She states her pain can be as high as 10 out of 10. It is a severe that wakes her from sleep at night. She is right-hand dominant. She denies numbness or paresthesia or weakness.  Past Medical History  Diagnosis Date  . No pertinent past medical history   . Hypertension     Past Surgical History  Procedure Laterality Date  . No past surgeries      Family History  Problem Relation Age of Onset  . Diabetes Mother   . Hypertension Mother   . Hyperlipidemia Mother   . Gout Father     History  Substance Use Topics  . Smoking status: Never Smoker   . Smokeless tobacco: Not on file  . Alcohol Use: No    OB History   Grav Para Term Preterm Abortions TAB SAB Ect Mult Living   1 1 1  0 0 0 0 0 0 1      Review of Systems  Constitutional: Negative for fever.  Respiratory: Negative for shortness of breath.   Cardiovascular: Negative for chest pain.  Gastrointestinal: Negative for nausea, vomiting, abdominal pain and diarrhea.  Musculoskeletal: Positive for arthralgias.  All other systems reviewed and are negative.    Allergies  Review of patient's allergies indicates no known allergies.  Home Medications   Current Outpatient Rx  Name  Route  Sig  Dispense  Refill  . albuterol (PROVENTIL HFA;VENTOLIN HFA) 108 (90 BASE) MCG/ACT inhaler   Inhalation   Inhale 2 puffs  into the lungs every 6 (six) hours as needed. For shortness of breath.         . cholecalciferol (VITAMIN D) 1000 UNITS tablet   Oral   Take 1,000 Units by mouth daily.         Marland Kitchen ibuprofen (ADVIL,MOTRIN) 600 MG tablet   Oral   Take 1 tablet (600 mg total) by mouth every 6 (six) hours as needed for pain.   30 tablet   5   . oxyCODONE-acetaminophen (PERCOCET/ROXICET) 5-325 MG per tablet   Oral   Take 1-2 tablets by mouth every 4 (four) hours as needed for pain (moderate - severe pain).   30 tablet   0   . Prenat w/o A-FeCbn-DSS-FA-DHA (CITRANATAL HARMONY) 29-1-265 MG CAPS                 BP 124/77  Pulse 64  Temp(Src) 97.5 F (36.4 C) (Oral)  Resp 14  Ht 5' 7.5" (1.715 m)  Wt 258 lb (117.028 kg)  BMI 39.79 kg/m2  SpO2 98%  LMP 05/17/2012  Physical Exam  Nursing note and vitals reviewed. Constitutional: She is oriented to person, place, and time. She appears well-developed and well-nourished. No distress.  HENT:  Head: Normocephalic and atraumatic.  Mouth/Throat: Oropharynx is clear and moist.  Eyes: Conjunctivae and EOM are normal. Pupils are equal, round,  and reactive to light.  Neck: Normal range of motion.  Cardiovascular: Normal rate, regular rhythm and intact distal pulses.   Pulmonary/Chest: Effort normal and breath sounds normal. No stridor. No respiratory distress. She has no wheezes. She has no rales. She exhibits no tenderness.  Abdominal: Soft. Bowel sounds are normal. She exhibits no distension and no mass. There is no tenderness. There is no rebound and no guarding.  Musculoskeletal: Normal range of motion.  Trace swelling to dorsum of left wrist, no snuffbox tenderness, full range of motion, neurovascularly intact, strength is 5 out of 5 in both wrist and fingers. Tinel and Phalen are negative.  Neurological: She is alert and oriented to person, place, and time.  Psychiatric: She has a normal mood and affect.    ED Course  Procedures (including  critical care time)  Labs Reviewed - No data to display Dg Wrist Complete Left  06/11/2012  *RADIOLOGY REPORT*  Clinical Data: Wrist pain for several months.  LEFT WRIST - COMPLETE 3+ VIEW  Comparison: None.  Findings: The mineralization and alignment are normal.  There is no evidence of acute fracture or dislocation.  The joint spaces are maintained.  No focal soft tissue abnormalities are identified.  IMPRESSION: Negative left wrist radiographs.   Original Report Authenticated By: Carey Bullocks, M.D.      1. Chronic wrist pain, left       MDM   Lisa Charles is a 27 y.o. female was 6 months of left wrist pain. Doubt carpal tunnel. X-rays ordered to evaluate fracture status post fall several months ago. Physical exam is unremarkable.   Filed Vitals:   06/11/12 0723  BP: 124/77  Pulse: 64  Temp: 97.5 F (36.4 C)  TempSrc: Oral  Resp: 14  Height: 5' 7.5" (1.715 m)  Weight: 258 lb (117.028 kg)  SpO2: 98%     Pt verbalized understanding and agrees with care plan. Outpatient follow-up and return precautions given.    New Prescriptions   IBUPROFEN (ADVIL,MOTRIN) 800 MG TABLET    Take 1 tablet (800 mg total) by mouth 3 (three) times daily.           Wynetta Emery, PA-C 06/11/12 [phone]

## 2012-06-11 NOTE — ED Notes (Signed)
Pt states left wrist pain for 6 months, denies injury. States last night was popping in and out all night long. A x 4. NAD

## 2012-06-11 NOTE — ED Provider Notes (Signed)
Medical screening examination/treatment/procedure(s) were performed by non-physician practitioner and as supervising physician I was immediately available for consultation/collaboration.   Dione Booze, MD 06/11/12 [phone]

## 2012-11-01 ENCOUNTER — Encounter (HOSPITAL_COMMUNITY): Payer: Self-pay | Admitting: Emergency Medicine

## 2012-11-01 ENCOUNTER — Emergency Department (HOSPITAL_COMMUNITY)
Admission: EM | Admit: 2012-11-01 | Discharge: 2012-11-01 | Disposition: A | Discharge status: Home / Self Care | Payer: Self-pay | Attending: Emergency Medicine | Admitting: Emergency Medicine

## 2012-11-01 DIAGNOSIS — R599 Enlarged lymph nodes, unspecified: Secondary | ICD-10-CM | POA: Insufficient documentation

## 2012-11-01 DIAGNOSIS — Z79899 Other long term (current) drug therapy: Secondary | ICD-10-CM | POA: Insufficient documentation

## 2012-11-01 DIAGNOSIS — J020 Streptococcal pharyngitis: Secondary | ICD-10-CM | POA: Insufficient documentation

## 2012-11-01 DIAGNOSIS — I10 Essential (primary) hypertension: Secondary | ICD-10-CM | POA: Insufficient documentation

## 2012-11-01 DIAGNOSIS — H9209 Otalgia, unspecified ear: Secondary | ICD-10-CM | POA: Insufficient documentation

## 2012-11-01 DIAGNOSIS — R1310 Dysphagia, unspecified: Secondary | ICD-10-CM | POA: Insufficient documentation

## 2012-11-01 LAB — RAPID STREP SCREEN (MED CTR MEBANE ONLY): Streptococcus, Group A Screen (Direct): POSITIVE — AB

## 2012-11-01 MED ORDER — CEPHALEXIN 500 MG PO CAPS: 500.0000 mg | ORAL_CAPSULE | Freq: Four times a day (QID) | ORAL | Status: DC

## 2012-11-01 MED ORDER — CLINDAMYCIN HCL 150 MG PO CAPS: 450.0000 mg | ORAL_CAPSULE | Freq: Three times a day (TID) | ORAL | Status: DC

## 2012-11-01 NOTE — ED Provider Notes (Signed)
CSN: [id number]     Arrival date & time 11/01/12  1945 History  This chart was scribed for non-physician practitioner Raymon Mutton, PA-C working with Richardean Canal, MD by Danella Maiers, ED Scribe. This patient was seen in room TR06C/TR06C and the patient's care was started at 9:14 PM.    Chief Complaint  Patient presents with  . Sore Throat   The history is provided by the patient. No language interpreter was used.   HPI Comments: Lisa Charles is a 27 y.o. female who presents to the Emergency Department complaining of constant, burning, sore throat with associated swelling onset three days ago. She reports pain with swallowing that travels to her ears. She denies difficulty swallowing. Neither food nor drink is worse for swallowing. Her sister had a sore throat recently but thinks it was viral. She has been taking ibuprofen at home with moderate relief. She denies cough, nasal congestion, fever, chills, ear pain, neck pain, neck stiffness, numbness and tingling, CP, SOB, dyspnea, abdominal pain, eye discomfort, facial swelling and pain, headache, dizziness, and voice changes.   Past Medical History  Diagnosis Date  . No pertinent past medical history   . Hypertension    Past Surgical History  Procedure Laterality Date  . No past surgeries     Family History  Problem Relation Age of Onset  . Diabetes Mother   . Hypertension Mother   . Hyperlipidemia Mother   . Gout Father    History  Substance Use Topics  . Smoking status: Never Smoker   . Smokeless tobacco: Not on file  . Alcohol Use: No   OB History   Grav Para Term Preterm Abortions TAB SAB Ect Mult Living   1 1 1  0 0 0 0 0 0 1     Review of Systems  Constitutional: Negative for fever and chills.  HENT: Positive for ear pain and sore throat. Negative for congestion, facial swelling, trouble swallowing, neck pain, neck stiffness and voice change.   Eyes: Negative for pain.  Respiratory: Negative for cough and  shortness of breath.   Cardiovascular: Negative for chest pain.  Gastrointestinal: Negative for abdominal pain.  Neurological: Negative for numbness and headaches.  All other systems reviewed and are negative.    Allergies  Review of patient's allergies indicates no known allergies.  Home Medications   Current Outpatient Rx  Name  Route  Sig  Dispense  Refill  . albuterol (PROVENTIL HFA;VENTOLIN HFA) 108 (90 BASE) MCG/ACT inhaler   Inhalation   Inhale 2 puffs into the lungs every 6 (six) hours as needed for wheezing or shortness of breath.          Marland Kitchen ibuprofen (ADVIL,MOTRIN) 600 MG tablet   Oral   Take 1 tablet (600 mg total) by mouth every 6 (six) hours as needed for pain.   30 tablet   5   . cephALEXin (KEFLEX) 500 MG capsule   Oral   Take 1 capsule (500 mg total) by mouth 4 (four) times daily.   40 capsule   0    BP 128/55  Pulse 65  Temp(Src) 99.6 F (37.6 C) (Oral)  Resp 16  SpO2 100%  LMP 10/24/2012 Physical Exam  Nursing note and vitals reviewed. Constitutional: She is oriented to person, place, and time. She appears well-developed and well-nourished. No distress.  HENT:  Head: Normocephalic and atraumatic.  Mouth/Throat: Oropharyngeal exudate present.  Erythema and mild swelling noted to posterior oropharynx. Negative petechiae noted to  soft palate. Bilateral tonsillar adenopathy with exudate noted. Uvula midline, symmetrical elevation. Negative signs of peritonsillar abscess.  Eyes: Conjunctivae and EOM are normal. Pupils are equal, round, and reactive to light.  Neck: Normal range of motion. Neck supple.  Negative neck stiffness Negative nuchal rigidity Adenopathy noted to the tonsils bilaterally - soft, nontender, mobile   Cardiovascular: Normal rate, regular rhythm and normal heart sounds.  Exam reveals no friction rub.   No murmur heard. Pulses:      Radial pulses are 2+ on the right side, and 2+ on the left side.  Pulmonary/Chest: Effort normal  and breath sounds normal. No respiratory distress. She has no wheezes. She has no rales.  Musculoskeletal: Normal range of motion.  Neurological: She is alert and oriented to person, place, and time. No cranial nerve deficit. She exhibits normal muscle tone. Coordination normal.  Cranial nerves III through XII grossly  Skin: Skin is warm and dry. No rash noted. She is not diaphoretic. No erythema.  Psychiatric: She has a normal mood and affect. Her behavior is normal. Thought content normal.    ED Course  Procedures (including critical care time)  Medications - No data to display  DIAGNOSTIC STUDIES: Oxygen Saturation is 100% on room air, normal by my interpretation.    COORDINATION OF CARE: 10:04 PM- Discussed treatment plan with pt which includes treatment with antibiotics and pt agrees to plan.    Labs Review Labs Reviewed  RAPID STREP SCREEN - Abnormal; Notable for the following:    Streptococcus, Group A Screen (Direct) POSITIVE (*)    All other components within normal limits   Imaging Review No results found.  MDM   1. Strep pharyngitis    I personally performed the services described in this documentation, which was scribed in my presence. The recorded information has been reviewed and is accurate.  Patient presenting to the emergency department with sore throat that has been ongoing since Monday. Patient reported that it is a constant burning sensation, reported that swallowing foods and liquids does not make the pain any worse. Reported that she's been using ibuprofen which has been helping. Alert and oriented. Erythema, mild swelling noted to posterior oropharynx with swelling bilaterally to the tonsils with exudate noted. Negative petechiae. Uvula midline with symmetrical elevation - negative signs of peritonsillar abscess. Negative cervical lymphadenopathy. Negative neck stiffness, negative nuchal rigidity. Rapid strep test positive. Patient stable, afebrile. Strep  pharyngitis noted. Discharged patient with antibiotics. Discussed with patient to rest and stay hydrated. Discussed with patient to continue use ibuprofen as needed for discomfort and for fever. Discussed with patient to monitor symptoms closely and if she starts to notice any change in voice, swelling to the throat, difficulty swallowing, chest pain, difficulty breathing the patient to report back to emergency department immediately. Since patient is breast feeding infant discussed with patient that the antibiotic she is using can transmit into the breast milk - discussed with patient symptoms to monitor and infant, recommended infant to be reevaluated by pediatrician next week. Discussed with patient that if she is to see any changes or reaction in the infant she is to stop the medication immediately and refrain from breast-feeding the infant. Discussed with patient to continue to monitor symptoms and if symptoms are to worsen or change report back to emergency department-strict return instructions given. Patient agreed to plan of care, understood, all questions answered.  Raymon Mutton, PA-C 11/02/12 1311  [address], PA-C 11/02/12 1311  [address] PA-C 11/02/12  1344 

## 2012-11-01 NOTE — ED Notes (Signed)
PT. REPORTS SORE THROAT WITH SWELLING ONSET 2 DAYS AGO , DENIES COUGH OD CONGESTION , NO FEVER OR CHILLS.  AIRWAY INTACT / RESPIRATIONS UNLABORED.

## 2012-11-04 NOTE — ED Provider Notes (Signed)
Medical screening examination/treatment/procedure(s) were performed by non-physician practitioner and as supervising physician I was immediately available for consultation/collaboration.   David H Yao, MD 11/04/12 2146 

## 2012-12-25 ENCOUNTER — Encounter (HOSPITAL_COMMUNITY): Payer: Self-pay | Admitting: Emergency Medicine

## 2012-12-25 ENCOUNTER — Emergency Department (HOSPITAL_COMMUNITY)
Admission: EM | Admit: 2012-12-25 | Discharge: 2012-12-25 | Disposition: A | Discharge status: Home / Self Care | Payer: Medicaid Other | Attending: Emergency Medicine | Admitting: Emergency Medicine

## 2012-12-25 DIAGNOSIS — R51 Headache: Secondary | ICD-10-CM | POA: Insufficient documentation

## 2012-12-25 DIAGNOSIS — B9689 Other specified bacterial agents as the cause of diseases classified elsewhere: Secondary | ICD-10-CM

## 2012-12-25 DIAGNOSIS — I10 Essential (primary) hypertension: Secondary | ICD-10-CM | POA: Insufficient documentation

## 2012-12-25 DIAGNOSIS — N760 Acute vaginitis: Secondary | ICD-10-CM | POA: Insufficient documentation

## 2012-12-25 DIAGNOSIS — H53149 Visual discomfort, unspecified: Secondary | ICD-10-CM | POA: Insufficient documentation

## 2012-12-25 DIAGNOSIS — R110 Nausea: Secondary | ICD-10-CM | POA: Insufficient documentation

## 2012-12-25 LAB — WET PREP, GENITAL
Trich, Wet Prep: NONE SEEN
Yeast Wet Prep HPF POC: NONE SEEN

## 2012-12-25 MED ORDER — ACETAMINOPHEN 500 MG PO TABS: 1000.0000 mg | ORAL_TABLET | Freq: Once | ORAL | Status: DC

## 2012-12-25 MED ORDER — KETOROLAC TROMETHAMINE 30 MG/ML IJ SOLN
30.0000 mg | Freq: Once | INTRAMUSCULAR | Status: AC
  Administered 2012-12-25: 30 mg via INTRAVENOUS
  Filled 2012-12-25: qty 1

## 2012-12-25 MED ORDER — SODIUM CHLORIDE 0.9 % IV BOLUS (SEPSIS)
1000.0000 mL | Freq: Once | INTRAVENOUS | Status: AC
  Administered 2012-12-25: 1000 mL via INTRAVENOUS

## 2012-12-25 MED ORDER — DIPHENHYDRAMINE HCL 50 MG/ML IJ SOLN
25.0000 mg | Freq: Once | INTRAMUSCULAR | Status: AC
  Administered 2012-12-25: 25 mg via INTRAVENOUS
  Filled 2012-12-25: qty 1

## 2012-12-25 MED ORDER — AZITHROMYCIN 250 MG PO TABS
1000.0000 mg | ORAL_TABLET | Freq: Once | ORAL | Status: AC
  Administered 2012-12-25: 1000 mg via ORAL
  Filled 2012-12-25: qty 4

## 2012-12-25 MED ORDER — METOCLOPRAMIDE HCL 5 MG/ML IJ SOLN
10.0000 mg | Freq: Once | INTRAMUSCULAR | Status: AC
  Administered 2012-12-25: 10 mg via INTRAVENOUS
  Filled 2012-12-25: qty 2

## 2012-12-25 MED ORDER — CEFTRIAXONE SODIUM 250 MG IJ SOLR
250.0000 mg | Freq: Once | INTRAMUSCULAR | Status: AC
  Administered 2012-12-25: 250 mg via INTRAMUSCULAR
  Filled 2012-12-25: qty 250

## 2012-12-25 MED ORDER — METRONIDAZOLE 500 MG PO TABS: 500.0000 mg | ORAL_TABLET | Freq: Two times a day (BID) | ORAL | Status: DC

## 2012-12-25 MED ORDER — LIDOCAINE HCL (PF) 1 % IJ SOLN
INTRAMUSCULAR | Status: AC
  Administered 2012-12-25: 5 mL
  Filled 2012-12-25: qty 5

## 2012-12-25 NOTE — ED Provider Notes (Signed)
CSN: [id number]     Arrival date & time 12/25/12  0744 History   First MD Initiated Contact with Patient 12/25/12 0747     Chief Complaint  Patient presents with  . Vaginal Discharge   (Consider location/radiation/quality/duration/timing/severity/associated sxs/prior Treatment) HPI Comments: Patient is a 27 year old female with no past medical history who presents with a headache for 4 days. Patient reports a gradual onset and progressive worsening of the headache. The pain is sharp, constant and is located in generalized head without radiation. Patient has tried tylenol, ibuprofen and amitryptiline for symptoms without relief. No alleviating/aggravating factors. Patient reports associated nausea and photophobia. Patient denies fever, vomiting, diarrhea, numbness/tingling, weakness, visual changes, congestion, chest pain, SOB, abdominal pain. Patient also reports a vaginal odor for the past few days.    Patient is a 27 y.o. female presenting with vaginal discharge.  Vaginal Discharge   Past Medical History  Diagnosis Date  . No pertinent past medical history   . Hypertension    Past Surgical History  Procedure Laterality Date  . No past surgeries     Family History  Problem Relation Age of Onset  . Diabetes Mother   . Hypertension Mother   . Hyperlipidemia Mother   . Gout Father    History  Substance Use Topics  . Smoking status: Never Smoker   . Smokeless tobacco: Not on file  . Alcohol Use: No   OB History   Grav Para Term Preterm Abortions TAB SAB Ect Mult Living   1 1 1  0 0 0 0 0 0 1     Review of Systems  Genitourinary: Positive for vaginal discharge.  Neurological: Positive for headaches.  All other systems reviewed and are negative.    Allergies  Review of patient's allergies indicates no known allergies.  Home Medications   Current Outpatient Rx  Name  Route  Sig  Dispense  Refill  . albuterol (PROVENTIL HFA;VENTOLIN HFA) 108 (90 BASE) MCG/ACT  inhaler   Inhalation   Inhale 2 puffs into the lungs every 6 (six) hours as needed for wheezing or shortness of breath.           BP 118/56  Pulse 92  Temp(Src) 98.3 F (36.8 C) (Oral)  Resp 16  SpO2 100% Physical Exam  Nursing note and vitals reviewed. Constitutional: She is oriented to person, place, and time. She appears well-developed and well-nourished. No distress.  HENT:  Head: Normocephalic and atraumatic.  Eyes: Conjunctivae and EOM are normal.  Neck: Normal range of motion.  Cardiovascular: Normal rate and regular rhythm.  Exam reveals no gallop and no friction rub.   No murmur heard. Pulmonary/Chest: Effort normal and breath sounds normal. She has no wheezes. She has no rales. She exhibits no tenderness.  Abdominal: Soft. She exhibits no distension. There is no tenderness. There is no rebound.  Genitourinary:  Copious vaginal discharge that is white and thick. No CMT. Cervical os closed.   Musculoskeletal: Normal range of motion.  Neurological: She is alert and oriented to person, place, and time. No cranial nerve deficit. Coordination normal.  Speech is goal-oriented. Moves limbs without ataxia.   Skin: Skin is warm and dry.  Psychiatric: She has a normal mood and affect. Her behavior is normal.    ED Course  Procedures (including critical care time) Labs Review Labs Reviewed  WET PREP, GENITAL - Abnormal; Notable for the following:    Clue Cells Wet Prep HPF POC FEW (*)  WBC, Wet Prep HPF POC TOO NUMEROUS TO COUNT (*)    All other components within normal limits  GC/CHLAMYDIA PROBE AMP   Imaging Review No results found.  EKG Interpretation   None       MDM   1. BV (bacterial vaginosis)   2. Headache     9:00 AM Vitals stable and patient afebrile. Patient given toradol, reglan, and benadryl for headache. Wet prep and GC chlamydia pending.   10:00 AM Patient will be treated for BV. GC/Chlamydia culture pending. Patient will be treated for  GC/Chlamydia here due to [facility] WBC on wet pre. Vitals stable and patient afebrile. Patient's headache has resolved since receiving migraine cocktail.   Emilia Beck, PA-C 12/25/12 1005

## 2012-12-25 NOTE — ED Notes (Signed)
Pt states that she has had a vag d/c since sat with bad odor denies dysuria at this time LMP was sept 28 has had a h/a since Sunday and her sister gave her one of her amytripiline and that did not help

## 2012-12-25 NOTE — ED Provider Notes (Signed)
Medical screening examination/treatment/procedure(s) were performed by non-physician practitioner and as supervising physician I was immediately available for consultation/collaboration.   Armen Waring B. Bernette Mayers, MD 12/25/12 1007

## 2012-12-25 NOTE — ED Notes (Signed)
Patient is alert and orientedx4.  Patient was explained discharge instructions and they understood them with no questions.  The patient's brother, velva molinari, is taking the patient home.

## 2013-05-10 ENCOUNTER — Encounter (HOSPITAL_COMMUNITY): Payer: Self-pay | Admitting: Emergency Medicine

## 2013-05-10 ENCOUNTER — Emergency Department (HOSPITAL_COMMUNITY)
Admission: EM | Admit: 2013-05-10 | Discharge: 2013-05-11 | Disposition: A | Discharge status: Home / Self Care | Payer: Medicaid Other | Attending: Emergency Medicine | Admitting: Emergency Medicine

## 2013-05-10 DIAGNOSIS — Y929 Unspecified place or not applicable: Secondary | ICD-10-CM | POA: Insufficient documentation

## 2013-05-10 DIAGNOSIS — S39012A Strain of muscle, fascia and tendon of lower back, initial encounter: Secondary | ICD-10-CM

## 2013-05-10 DIAGNOSIS — I10 Essential (primary) hypertension: Secondary | ICD-10-CM | POA: Insufficient documentation

## 2013-05-10 DIAGNOSIS — Z79899 Other long term (current) drug therapy: Secondary | ICD-10-CM | POA: Insufficient documentation

## 2013-05-10 DIAGNOSIS — R110 Nausea: Secondary | ICD-10-CM | POA: Insufficient documentation

## 2013-05-10 DIAGNOSIS — IMO0001 Reserved for inherently not codable concepts without codable children: Secondary | ICD-10-CM | POA: Insufficient documentation

## 2013-05-10 DIAGNOSIS — S339XXA Sprain of unspecified parts of lumbar spine and pelvis, initial encounter: Secondary | ICD-10-CM | POA: Insufficient documentation

## 2013-05-10 DIAGNOSIS — S335XXA Sprain of ligaments of lumbar spine, initial encounter: Principal | ICD-10-CM

## 2013-05-10 DIAGNOSIS — Y9389 Activity, other specified: Secondary | ICD-10-CM | POA: Insufficient documentation

## 2013-05-10 DIAGNOSIS — IMO0002 Reserved for concepts with insufficient information to code with codable children: Secondary | ICD-10-CM | POA: Insufficient documentation

## 2013-05-10 LAB — URINALYSIS, ROUTINE W REFLEX MICROSCOPIC
BILIRUBIN URINE: NEGATIVE
Glucose, UA: NEGATIVE mg/dL
Hgb urine dipstick: NEGATIVE
KETONES UR: NEGATIVE mg/dL
LEUKOCYTES UA: NEGATIVE
NITRITE: NEGATIVE
PH: 5.5 (ref 5.0–8.0)
Protein, ur: NEGATIVE mg/dL
SPECIFIC GRAVITY, URINE: 1.028 (ref 1.005–1.030)
UROBILINOGEN UA: 0.2 mg/dL (ref 0.0–1.0)

## 2013-05-10 LAB — COMPREHENSIVE METABOLIC PANEL
ALBUMIN: 4.1 g/dL (ref 3.5–5.2)
ALK PHOS: 85 U/L (ref 39–117)
ALT: 15 U/L (ref 0–35)
AST: 18 U/L (ref 0–37)
BUN: 11 mg/dL (ref 6–23)
CALCIUM: 9.9 mg/dL (ref 8.4–10.5)
CO2: 24 mEq/L (ref 19–32)
Chloride: 100 mEq/L (ref 96–112)
Creatinine, Ser: 0.58 mg/dL (ref 0.50–1.10)
GFR calc Af Amer: >90 mL/min (ref >90)
GFR calc non Af Amer: >90 mL/min (ref >90)
Glucose, Bld: 95 mg/dL (ref 70–99)
POTASSIUM: 3.7 meq/L (ref 3.7–5.3)
SODIUM: 137 meq/L (ref 137–147)
TOTAL PROTEIN: 8.5 g/dL — AB (ref 6.0–8.3)
Total Bilirubin: <0.2 mg/dL — ABNORMAL LOW (ref 0.3–1.2)

## 2013-05-10 LAB — CBC WITH DIFFERENTIAL/PLATELET
BASOS ABS: 0 10*3/uL (ref 0.0–0.1)
Basophils Relative: 0 % (ref 0–1)
EOS ABS: 0.1 10*3/uL (ref 0.0–0.7)
EOS PCT: 1 % (ref 0–5)
HCT: 37.7 % (ref 36.0–46.0)
Hemoglobin: 12.3 g/dL (ref 12.0–15.0)
LYMPHS ABS: 3.7 10*3/uL (ref 0.7–4.0)
Lymphocytes Relative: 30 % (ref 12–46)
MCH: 26.8 pg (ref 26.0–34.0)
MCHC: 32.6 g/dL (ref 30.0–36.0)
MCV: 82.1 fL (ref 78.0–100.0)
Monocytes Absolute: 0.8 10*3/uL (ref 0.1–1.0)
Monocytes Relative: 6 % (ref 3–12)
NEUTROS PCT: 63 % (ref 43–77)
Neutro Abs: 7.6 10*3/uL (ref 1.7–7.7)
PLATELETS: 355 10*3/uL (ref 150–400)
RBC: 4.59 MIL/uL (ref 3.87–5.11)
RDW: 15.4 % (ref 11.5–15.5)
WBC: 12.2 10*3/uL — AB (ref 4.0–10.5)

## 2013-05-10 LAB — LIPASE, BLOOD: Lipase: 25 U/L (ref 11–59)

## 2013-05-10 LAB — PREGNANCY, URINE: Preg Test, Ur: NEGATIVE

## 2013-05-10 MED ORDER — IBUPROFEN 800 MG PO TABS: 800.0000 mg | ORAL_TABLET | Freq: Three times a day (TID) | ORAL | Status: DC | PRN

## 2013-05-10 MED ORDER — IBUPROFEN 400 MG PO TABS
800.0000 mg | ORAL_TABLET | Freq: Once | ORAL | Status: AC
  Administered 2013-05-11: 800 mg via ORAL
  Filled 2013-05-10: qty 2

## 2013-05-10 MED ORDER — CYCLOBENZAPRINE HCL 10 MG PO TABS
5.0000 mg | ORAL_TABLET | Freq: Once | ORAL | Status: AC
  Administered 2013-05-11: 5 mg via ORAL
  Filled 2013-05-10: qty 1

## 2013-05-10 MED ORDER — CYCLOBENZAPRINE HCL 5 MG PO TABS: 5.0000 mg | ORAL_TABLET | Freq: Three times a day (TID) | ORAL | Status: DC | PRN

## 2013-05-10 NOTE — ED Notes (Signed)
abd pain for 2-3 days with nv no diarrhea.  lmp feb 3rd

## 2013-05-10 NOTE — ED Provider Notes (Signed)
CSN: [id number]     Arrival date & time 05/10/13  2107 History  This chart was scribed for Earley Favor, NP, working with Doug Sou, MD, by [facility] ED Scribe. This patient was seen in room TR08C/TR08C and the patient's care was started at 11:28 PM.   Chief Complaint  Patient presents with  . Abdominal Pain    The history is provided by the patient. No language interpreter was used.    HPI Comments: Lisa Charles is a 28 y.o. Female with a history of HTN who presents to the Emergency Department complaining of intermittent left-sided abdominal pain that radiates to her back over the past 2 days. She states that her pain is only present with certain movements. She reports associated nausea over the past 2 days. She states that she occasionally lifts things at work, although she believes she has not injured herself. She reports that she has not taken any medications for her pain. She is also requesting a pregnancy test. She denies vomiting or diarrhea.   Past Medical History  Diagnosis Date  . No pertinent past medical history   . Hypertension    Past Surgical History  Procedure Laterality Date  . No past surgeries     Family History  Problem Relation Age of Onset  . Diabetes Mother   . Hypertension Mother   . Hyperlipidemia Mother   . Gout Father    History  Substance Use Topics  . Smoking status: Never Smoker   . Smokeless tobacco: Not on file  . Alcohol Use: No   OB History   Grav Para Term Preterm Abortions TAB SAB Ect Mult Living   1 1 1  0 0 0 0 0 0 1     Review of Systems  Constitutional: Negative for fever and chills.  Cardiovascular: Negative for chest pain.  Gastrointestinal: Positive for nausea and abdominal pain. Negative for vomiting, diarrhea and constipation.  Genitourinary: Negative for dysuria, frequency, vaginal bleeding and vaginal discharge.  Musculoskeletal: Positive for back pain and myalgias.  Skin: Negative for rash and wound.  All other  systems reviewed and are negative.   Allergies  Review of patient's allergies indicates no known allergies.  Home Medications   Current Outpatient Rx  Name  Route  Sig  Dispense  Refill  . albuterol (PROVENTIL HFA;VENTOLIN HFA) 108 (90 BASE) MCG/ACT inhaler   Inhalation   Inhale 2 puffs into the lungs every 6 (six) hours as needed for wheezing or shortness of breath.          . cyclobenzaprine (FLEXERIL) 5 MG tablet   Oral   Take 1 tablet (5 mg total) by mouth 3 (three) times daily as needed for muscle spasms.   30 tablet   0   . ibuprofen (ADVIL,MOTRIN) 800 MG tablet   Oral   Take 1 tablet (800 mg total) by mouth every 8 (eight) hours as needed.   30 tablet   0    Triage Vitals: BP 134/59  Pulse 80  Temp(Src) 97.8 F (36.6 C) (Oral)  Resp 18  Wt 296 lb 9 oz (134.52 kg)  SpO2 99%  LMP 04/09/2013  Physical Exam  Nursing note and vitals reviewed. Constitutional: She is oriented to person, place, and time. She appears well-developed and well-nourished. No distress.  HENT:  Head: Normocephalic and atraumatic.  Eyes: EOM are normal.  Neck: Neck supple. No tracheal deviation present.  Cardiovascular: Normal rate.   Pulmonary/Chest: Effort normal. No respiratory distress.  Abdominal: Soft. Bowel sounds are normal. She exhibits no distension.  Musculoskeletal: Normal range of motion.       Back:  Neurological: She is alert and oriented to person, place, and time.  Skin: Skin is warm and dry.  Psychiatric: She has a normal mood and affect. Her behavior is normal.    ED Course  Procedures (including critical care time)  DIAGNOSTIC STUDIES: Oxygen Saturation is 99% on RA, normal by my interpretation.    COORDINATION OF CARE: 11:31 PM- Discussed lab results and clinical suspicion that pt is having pain of a muscular nature. Pt advised of plan for treatment and pt agrees.  Labs Review Labs Reviewed  CBC WITH DIFFERENTIAL - Abnormal; Notable for the following:     WBC 12.2 (*)    All other components within normal limits  COMPREHENSIVE METABOLIC PANEL - Abnormal; Notable for the following:    Total Protein 8.5 (*)    Total Bilirubin <0.2 (*)    All other components within normal limits  URINALYSIS, ROUTINE W REFLEX MICROSCOPIC  PREGNANCY, URINE  LIPASE, BLOOD   Imaging Review No results found.   EKG Interpretation None      MDM   Final diagnoses:  Lumbosacral strain       I personally performed the services described in this documentation, which was scribed in my presence. The recorded information has been reviewed and is accurate.  Arman FilterGail K Jkai Arwood, NP 05/10/13 2351

## 2013-05-10 NOTE — Discharge Instructions (Signed)
Take the medication as needed for your discomfort

## 2013-05-11 MED ORDER — IBUPROFEN 400 MG PO TABS
400.0000 mg | ORAL_TABLET | Freq: Once | ORAL | Status: AC
  Administered 2013-05-11: 400 mg via ORAL
  Filled 2013-05-11: qty 1

## 2013-05-12 NOTE — ED Provider Notes (Signed)
Medical screening examination/treatment/procedure(s) were performed by non-physician practitioner and as supervising physician I was immediately available for consultation/collaboration.   Tiombe Tomeo, MD 05/12/13 0749 

## 2013-12-05 ENCOUNTER — Encounter (HOSPITAL_COMMUNITY): Payer: Self-pay | Admitting: Emergency Medicine

## 2013-12-05 ENCOUNTER — Emergency Department (HOSPITAL_COMMUNITY)
Admission: EM | Admit: 2013-12-05 | Discharge: 2013-12-05 | Disposition: A | Discharge status: Home / Self Care | Payer: Medicaid Other | Attending: Emergency Medicine | Admitting: Emergency Medicine

## 2013-12-05 DIAGNOSIS — I10 Essential (primary) hypertension: Secondary | ICD-10-CM | POA: Insufficient documentation

## 2013-12-05 DIAGNOSIS — J020 Streptococcal pharyngitis: Secondary | ICD-10-CM | POA: Diagnosis not present

## 2013-12-05 DIAGNOSIS — Z79899 Other long term (current) drug therapy: Secondary | ICD-10-CM | POA: Diagnosis not present

## 2013-12-05 DIAGNOSIS — J029 Acute pharyngitis, unspecified: Secondary | ICD-10-CM | POA: Diagnosis present

## 2013-12-05 HISTORY — DX: Bronchitis, not specified as acute or chronic: J40

## 2013-12-05 LAB — RAPID STREP SCREEN (MED CTR MEBANE ONLY): Streptococcus, Group A Screen (Direct): POSITIVE — AB

## 2013-12-05 MED ORDER — HYDROCODONE-ACETAMINOPHEN 7.5-325 MG/15ML PO SOLN: 15.0000 mL | ORAL | Status: DC | PRN

## 2013-12-05 MED ORDER — METHYLPREDNISOLONE SODIUM SUCC 125 MG IJ SOLR
125.0000 mg | Freq: Once | INTRAMUSCULAR | Status: AC
  Administered 2013-12-05: 125 mg via INTRAMUSCULAR
  Filled 2013-12-05: qty 2

## 2013-12-05 MED ORDER — PENICILLIN G BENZATHINE 1200000 UNIT/2ML IM SUSP
1.2000 10*6.[IU] | Freq: Once | INTRAMUSCULAR | Status: AC
  Administered 2013-12-05: 1.2 10*6.[IU] via INTRAMUSCULAR
  Filled 2013-12-05: qty 2

## 2013-12-05 MED ORDER — METHYLPREDNISOLONE SODIUM SUCC 125 MG IJ SOLR: 125.0000 mg | Freq: Once | INTRAMUSCULAR | Status: DC

## 2013-12-05 NOTE — Discharge Instructions (Signed)
Take the prescribed medication as directed. °Follow-up with your primary care physician. °Return to the ED for new or worsening symptoms. ° °

## 2013-12-05 NOTE — ED Notes (Signed)
No reaction noted with antibiotics

## 2013-12-05 NOTE — ED Notes (Signed)
Pt. reports sore throat / swelling with occasional dry cough onset yesterday , denies fever or chills. Airway intact / respirations unlabored .

## 2013-12-05 NOTE — ED Provider Notes (Signed)
CSN: [id number]     Arrival date & time 12/05/13  4098 History   First MD Initiated Contact with Patient 12/05/13 [phone]     Chief Complaint  Patient presents with  . Sore Throat  . Cough     (Consider location/radiation/quality/duration/timing/severity/associated sxs/prior Treatment) Patient is a 28 y.o. female presenting with pharyngitis and cough. The history is provided by the patient and medical records.  Sore Throat Associated symptoms include coughing and a sore throat.  Cough Associated symptoms: sore throat    This is a 28 year old female with past medical history significant for hypertension, presenting to the ED for sore throat, onset yesterday. Patient states she has severe pain when attempting to swallow, but no difficulty doing so. She has an occasional dry cough. She denies any fever or chills. She has had potential sick contacts at work.  No intervention tried PTA.  VS stable on arrival.  Past Medical History  Diagnosis Date  . No pertinent past medical history   . Hypertension   . Bronchitis    Past Surgical History  Procedure Laterality Date  . No past surgeries     Family History  Problem Relation Age of Onset  . Diabetes Mother   . Hypertension Mother   . Hyperlipidemia Mother   . Gout Father    History  Substance Use Topics  . Smoking status: Never Smoker   . Smokeless tobacco: Not on file  . Alcohol Use: Yes   OB History   Grav Para Term Preterm Abortions TAB SAB Ect Mult Living   1 1 1  0 0 0 0 0 0 1     Review of Systems  HENT: Positive for sore throat.   Respiratory: Positive for cough.   All other systems reviewed and are negative.     Allergies  Review of patient's allergies indicates no known allergies.  Home Medications   Prior to Admission medications   Medication Sig Start Date End Date Taking? Authorizing Provider  albuterol (PROVENTIL HFA;VENTOLIN HFA) 108 (90 BASE) MCG/ACT inhaler Inhale 2 puffs into the lungs every 6 (six)  hours as needed for wheezing or shortness of breath.    Yes Historical Provider, MD  ibuprofen (ADVIL,MOTRIN) 200 MG tablet Take 400 mg by mouth every 6 (six) hours as needed for mild pain.   Yes Historical Provider, MD   BP 131/70  Pulse 98  Temp(Src) 98.3 F (36.8 C) (Oral)  Resp 14  Ht 5\' 9"  (1.753 m)  Wt 298 lb (135.172 kg)  BMI 43.99 kg/m2  SpO2 100%  LMP 11/08/2013  Physical Exam  Nursing note and vitals reviewed. Constitutional: She is oriented to person, place, and time. She appears well-developed and well-nourished.  HENT:  Head: Normocephalic and atraumatic.  Mouth/Throat: Uvula is midline and mucous membranes are normal. No trismus in the jaw. Oropharyngeal exudate and posterior oropharyngeal erythema present. No posterior oropharyngeal edema or tonsillar abscesses.  Tonsils 2+ bilaterally with exudates present; uvula midline without evidence of peritonsillar abscess; handling secretions appropriately; no difficulty swallowing or speaking  Eyes: Conjunctivae and EOM are normal. Pupils are equal, round, and reactive to light.  Neck: Normal range of motion. Neck supple.  Cardiovascular: Normal rate, regular rhythm and normal heart sounds.   Pulmonary/Chest: Effort normal and breath sounds normal. No respiratory distress. She has no wheezes. She has no rales.  Musculoskeletal: Normal range of motion.  Lymphadenopathy:    She has cervical adenopathy.  Neurological: She is alert and oriented to person,  place, and time.  Skin: Skin is warm and dry.  Psychiatric: She has a normal mood and affect.    ED Course  Procedures (including critical care time) Labs Review Labs Reviewed  RAPID STREP SCREEN - Abnormal; Notable for the following:    Streptococcus, Group A Screen (Direct) POSITIVE (*)    All other components within normal limits    Imaging Review No results found.   EKG Interpretation None      MDM   Final diagnoses:  Strep pharyngitis   Rapid strep +.   On exam, patient is afebrile and overall nontoxic appearing. Tonsils are enlarged with exudates bilaterally, however uvula remains midline without evidence of peritonsillar abscess. She is handling secretions well, airway patent.  Lungs clear bilaterally. Patient elected to be treated with Bicillin in the ED, will add solu-medrol to help with tonsillar edema.  Rx hycet.  Patient to FU with PCP.  Discussed plan with patient, he/she acknowledged understanding and agreed with plan of care.  Return precautions given for new or worsening symptoms.  Garlon HatchetLisa M Tekoa Amon, PA-C 12/05/13 [phone]

## 2013-12-05 NOTE — ED Provider Notes (Signed)
Medical screening examination/treatment/procedure(s) were performed by non-physician practitioner and as supervising physician I was immediately available for consultation/collaboration.   EKG Interpretation None        Purvis SheffieldForrest Penelope Fittro, MD 12/05/13 1221

## 2014-01-06 ENCOUNTER — Encounter (HOSPITAL_COMMUNITY): Payer: Self-pay | Admitting: Emergency Medicine

## 2014-08-21 ENCOUNTER — Emergency Department (HOSPITAL_COMMUNITY)
Admission: EM | Admit: 2014-08-21 | Discharge: 2014-08-21 | Disposition: A | Discharge status: Home / Self Care | Payer: Medicaid Other | Attending: Emergency Medicine | Admitting: Emergency Medicine

## 2014-08-21 ENCOUNTER — Encounter (HOSPITAL_COMMUNITY): Payer: Self-pay | Admitting: *Deleted

## 2014-08-21 DIAGNOSIS — R1010 Upper abdominal pain, unspecified: Secondary | ICD-10-CM | POA: Insufficient documentation

## 2014-08-21 DIAGNOSIS — Z8709 Personal history of other diseases of the respiratory system: Secondary | ICD-10-CM | POA: Insufficient documentation

## 2014-08-21 DIAGNOSIS — Z3202 Encounter for pregnancy test, result negative: Secondary | ICD-10-CM | POA: Insufficient documentation

## 2014-08-21 DIAGNOSIS — Z79899 Other long term (current) drug therapy: Secondary | ICD-10-CM | POA: Insufficient documentation

## 2014-08-21 DIAGNOSIS — Z8759 Personal history of other complications of pregnancy, childbirth and the puerperium: Secondary | ICD-10-CM | POA: Insufficient documentation

## 2014-08-21 DIAGNOSIS — R112 Nausea with vomiting, unspecified: Secondary | ICD-10-CM | POA: Insufficient documentation

## 2014-08-21 DIAGNOSIS — R51 Headache: Secondary | ICD-10-CM | POA: Insufficient documentation

## 2014-08-21 DIAGNOSIS — N926 Irregular menstruation, unspecified: Secondary | ICD-10-CM | POA: Insufficient documentation

## 2014-08-21 DIAGNOSIS — I10 Essential (primary) hypertension: Secondary | ICD-10-CM | POA: Insufficient documentation

## 2014-08-21 HISTORY — DX: Unspecified pre-eclampsia, unspecified trimester: O14.90

## 2014-08-21 LAB — URINALYSIS, ROUTINE W REFLEX MICROSCOPIC
Bilirubin Urine: NEGATIVE
Glucose, UA: NEGATIVE mg/dL
HGB URINE DIPSTICK: NEGATIVE
Ketones, ur: NEGATIVE mg/dL
NITRITE: NEGATIVE
PROTEIN: 30 mg/dL — AB
SPECIFIC GRAVITY, URINE: 1.021 (ref 1.005–1.030)
Urobilinogen, UA: 1 mg/dL (ref 0.0–1.0)
pH: 7.5 (ref 5.0–8.0)

## 2014-08-21 LAB — URINE MICROSCOPIC-ADD ON

## 2014-08-21 LAB — POC URINE PREG, ED: Preg Test, Ur: NEGATIVE

## 2014-08-21 MED ORDER — ONDANSETRON 8 MG PO TBDP: 8.0000 mg | ORAL_TABLET | Freq: Three times a day (TID) | ORAL | Status: DC | PRN

## 2014-08-21 MED ORDER — ONDANSETRON 4 MG PO TBDP
4.0000 mg | ORAL_TABLET | Freq: Once | ORAL | Status: AC
  Administered 2014-08-21: 4 mg via ORAL
  Filled 2014-08-21: qty 1

## 2014-08-21 NOTE — Discharge Instructions (Signed)

## 2014-08-21 NOTE — ED Notes (Signed)
Pt states nausea yesterday and headache/emesis today.  Not sure if she's pregnant vs her daughter is sick also.

## 2014-08-21 NOTE — ED Notes (Signed)
Pt ambulatory w/ steady gait to restroom. 

## 2014-08-21 NOTE — ED Notes (Signed)
Pt given water and crackers for PO challenge

## 2014-08-21 NOTE — ED Provider Notes (Signed)
CSN: [id number]     Arrival date & time 08/21/14  0715 History   First MD Initiated Contact with Patient 08/21/14 [phone]     Chief Complaint  Patient presents with  . Nausea  . Headache     (Consider location/radiation/quality/duration/timing/severity/associated sxs/prior Treatment) Patient is a 29 y.o. female presenting with headaches. The history is provided by the patient.  Headache Associated symptoms: abdominal pain, nausea and vomiting   Associated symptoms: no fever    patient with nausea and vomiting. Began yesterday. Daughter has had similar symptoms. States she had nausea for a day and then began vomiting. No fevers. No diarrhea or constipation. She has a dull headache now. No confusion. No fevers. She states she is hungry. She is also worried that she could be pregnant. Her menses is a day or 2 late.  She states she could be pregnant but hopes that she is not. She has some mild dull upper abdominal pain.  Past Medical History  Diagnosis Date  . No pertinent past medical history   . Bronchitis   . Hypertension     pih  . Pre-eclampsia    Past Surgical History  Procedure Laterality Date  . No past surgeries     Family History  Problem Relation Age of Onset  . Diabetes Mother   . Hypertension Mother   . Hyperlipidemia Mother   . Gout Father    History  Substance Use Topics  . Smoking status: Never Smoker   . Smokeless tobacco: Not on file  . Alcohol Use: Yes     Comment: occ   OB History    Gravida Para Term Preterm AB TAB SAB Ectopic Multiple Living   1 1 1  0 0 0 0 0 0 1     Review of Systems  Constitutional: Negative for fever and appetite change.  Respiratory: Negative for shortness of breath.   Gastrointestinal: Positive for nausea, vomiting and abdominal pain.  Genitourinary: Positive for menstrual problem. Negative for decreased urine volume, vaginal bleeding, vaginal discharge and pelvic pain.  Skin: Negative for rash.  Neurological: Positive for  headaches.      Allergies  Review of patient's allergies indicates no known allergies.  Home Medications   Prior to Admission medications   Medication Sig Start Date End Date Taking? Authorizing Provider  albuterol (PROVENTIL HFA;VENTOLIN HFA) 108 (90 BASE) MCG/ACT inhaler Inhale 2 puffs into the lungs every 6 (six) hours as needed for wheezing or shortness of breath.    Yes Historical Provider, MD  cholecalciferol (VITAMIN D) 1000 UNITS tablet Take 1,000 Units by mouth daily.   Yes Historical Provider, MD  Fexofenadine HCl (ALLERGY 24-HR PO) Take 1 tablet by mouth daily as needed (allergies).   Yes Historical Provider, MD  ibuprofen (ADVIL,MOTRIN) 200 MG tablet Take 400 mg by mouth every 6 (six) hours as needed for mild pain.   Yes Historical Provider, MD  HYDROcodone-acetaminophen (HYCET) 7.5-325 mg/15 ml solution Take 15 mLs by mouth every 4 (four) hours as needed for moderate pain or severe pain. Patient not taking: Reported on 08/21/2014 12/05/13   Garlon Hatchet, PA-C  ondansetron (ZOFRAN-ODT) 8 MG disintegrating tablet Take 1 tablet (8 mg total) by mouth every 8 (eight) hours as needed for nausea or vomiting. 08/21/14   Benjiman Core, MD   BP 127/66 mmHg  Pulse 79  Temp(Src) 98.2 F (36.8 C) (Oral)  Resp 18  Ht 5\' 10"  (1.778 m)  Wt 298 lb (135.172 kg)  BMI 42.76  kg/m2  SpO2 99%  LMP 07/23/2014 Physical Exam  Constitutional: She appears well-developed.  HENT:  Head: Atraumatic.  Neck: Neck supple.  Cardiovascular: Normal rate and regular rhythm.   Pulmonary/Chest: Effort normal.  Abdominal: There is tenderness.  Minimal upper abdominal tenderness without rebound or guarding. No CVA tenderness.  Musculoskeletal: Normal range of motion.  Neurological: She is alert.    ED Course  Procedures (including critical care time) Labs Review Labs Reviewed  URINALYSIS, ROUTINE W REFLEX MICROSCOPIC (NOT AT [facility]) - Abnormal; Notable for the following:    Protein, ur 30 (*)     Leukocytes, UA TRACE (*)    All other components within normal limits  URINE MICROSCOPIC-ADD ON - Abnormal; Notable for the following:    Squamous Epithelial / LPF FEW (*)    All other components within normal limits  POC URINE PREG, ED    Imaging Review No results found.   EKG Interpretation None      MDM   Final diagnoses:  Non-intractable vomiting with nausea, vomiting of unspecified type    Patient with nausea and vomiting. Patient was concerned that she was pregnant, test is negative. Dull headache likely due to vomiting. Doubt intracranial hemorrhage. Will discharge home.    Benjiman Core, MD 08/21/14 [phone]

## 2014-08-22 ENCOUNTER — Encounter (HOSPITAL_COMMUNITY): Payer: Self-pay | Admitting: Emergency Medicine

## 2014-08-22 ENCOUNTER — Emergency Department (HOSPITAL_COMMUNITY)
Admission: EM | Admit: 2014-08-22 | Discharge: 2014-08-22 | Disposition: A | Discharge status: Home / Self Care | Payer: Medicaid Other | Attending: Emergency Medicine | Admitting: Emergency Medicine

## 2014-08-22 DIAGNOSIS — I10 Essential (primary) hypertension: Secondary | ICD-10-CM | POA: Insufficient documentation

## 2014-08-22 DIAGNOSIS — R109 Unspecified abdominal pain: Secondary | ICD-10-CM | POA: Insufficient documentation

## 2014-08-22 DIAGNOSIS — R112 Nausea with vomiting, unspecified: Secondary | ICD-10-CM | POA: Insufficient documentation

## 2014-08-22 DIAGNOSIS — Z79899 Other long term (current) drug therapy: Secondary | ICD-10-CM | POA: Insufficient documentation

## 2014-08-22 DIAGNOSIS — Z720 Tobacco use: Secondary | ICD-10-CM | POA: Insufficient documentation

## 2014-08-22 DIAGNOSIS — Z8709 Personal history of other diseases of the respiratory system: Secondary | ICD-10-CM | POA: Insufficient documentation

## 2014-08-22 LAB — CBC WITH DIFFERENTIAL/PLATELET
BASOS ABS: 0 10*3/uL (ref 0.0–0.1)
Basophils Relative: 0 % (ref 0–1)
Eosinophils Absolute: 0.1 10*3/uL (ref 0.0–0.7)
Eosinophils Relative: 1 % (ref 0–5)
HEMATOCRIT: 38.9 % (ref 36.0–46.0)
HEMOGLOBIN: 12.2 g/dL (ref 12.0–15.0)
Lymphocytes Relative: 23 % (ref 12–46)
Lymphs Abs: 1.5 10*3/uL (ref 0.7–4.0)
MCH: 26 pg (ref 26.0–34.0)
MCHC: 31.4 g/dL (ref 30.0–36.0)
MCV: 82.8 fL (ref 78.0–100.0)
MONO ABS: 0.5 10*3/uL (ref 0.1–1.0)
Monocytes Relative: 8 % (ref 3–12)
Neutro Abs: 4.2 10*3/uL (ref 1.7–7.7)
Neutrophils Relative %: 68 % (ref 43–77)
Platelets: 288 10*3/uL (ref 150–400)
RBC: 4.7 MIL/uL (ref 3.87–5.11)
RDW: 16.3 % — ABNORMAL HIGH (ref 11.5–15.5)
WBC: 6.3 10*3/uL (ref 4.0–10.5)

## 2014-08-22 LAB — COMPREHENSIVE METABOLIC PANEL
ALT: 13 U/L — ABNORMAL LOW (ref 14–54)
AST: 20 U/L (ref 15–41)
Albumin: 3.6 g/dL (ref 3.5–5.0)
Alkaline Phosphatase: 69 U/L (ref 38–126)
Anion gap: 8 (ref 5–15)
BILIRUBIN TOTAL: 0.3 mg/dL (ref 0.3–1.2)
BUN: 8 mg/dL (ref 6–20)
CHLORIDE: 102 mmol/L (ref 101–111)
CO2: 26 mmol/L (ref 22–32)
CREATININE: 0.73 mg/dL (ref 0.44–1.00)
Calcium: 8.5 mg/dL — ABNORMAL LOW (ref 8.9–10.3)
GLUCOSE: 92 mg/dL (ref 65–99)
Potassium: 3.5 mmol/L (ref 3.5–5.1)
Sodium: 136 mmol/L (ref 135–145)
Total Protein: 7.5 g/dL (ref 6.5–8.1)

## 2014-08-22 LAB — LIPASE, BLOOD: Lipase: 21 U/L — ABNORMAL LOW (ref 22–51)

## 2014-08-22 MED ORDER — SODIUM CHLORIDE 0.9 % IV SOLN
1000.0000 mL | INTRAVENOUS | Status: DC
  Administered 2014-08-22: 1000 mL via INTRAVENOUS

## 2014-08-22 MED ORDER — ONDANSETRON HCL 4 MG/2ML IJ SOLN
4.0000 mg | Freq: Once | INTRAMUSCULAR | Status: AC
  Administered 2014-08-22: 4 mg via INTRAVENOUS
  Filled 2014-08-22: qty 2

## 2014-08-22 MED ORDER — ONDANSETRON 4 MG PO TBDP
8.0000 mg | ORAL_TABLET | Freq: Once | ORAL | Status: AC
  Administered 2014-08-22: 8 mg via ORAL
  Filled 2014-08-22: qty 2

## 2014-08-22 MED ORDER — ONDANSETRON HCL 4 MG PO TABS: 4.0000 mg | ORAL_TABLET | Freq: Four times a day (QID) | ORAL | Status: DC

## 2014-08-22 MED ORDER — SODIUM CHLORIDE 0.9 % IV SOLN
1000.0000 mL | Freq: Once | INTRAVENOUS | Status: AC
  Administered 2014-08-22: 1000 mL via INTRAVENOUS

## 2014-08-22 NOTE — ED Provider Notes (Signed)
CSN: [id number]     Arrival date & time 08/22/14  0330 History   First MD Initiated Contact with Patient 08/22/14 0430     Chief Complaint  Patient presents with  . Nausea     (Consider location/radiation/quality/duration/timing/severity/associated sxs/prior Treatment) The history is provided by the patient.   29 year old female had recurrence of nausea and vomiting about 1 AM. She had been seen in the ED yesterday for nausea and vomiting and was discharged with a prescription for ondansetron. She states that she did not get the prescription filled and just went home to rest. She had been doing well until she woke up at 1 AM. And there had been a sick contact at home with similar illness. She is complaining of some mild abdominal pain which he rates at 3/10. There is no diarrhea. She denies fever, chills, sweats.  Past Medical History  Diagnosis Date  . No pertinent past medical history   . Bronchitis   . Hypertension     pih  . Pre-eclampsia    Past Surgical History  Procedure Laterality Date  . No past surgeries     Family History  Problem Relation Age of Onset  . Diabetes Mother   . Hypertension Mother   . Hyperlipidemia Mother   . Gout Father    History  Substance Use Topics  . Smoking status: Current Some Day Smoker  . Smokeless tobacco: Never Used  . Alcohol Use: Yes     Comment: occ   OB History    Gravida Para Term Preterm AB TAB SAB Ectopic Multiple Living   1 1 1  0 0 0 0 0 0 1     Review of Systems  All other systems reviewed and are negative.     Allergies  Review of patient's allergies indicates no known allergies.  Home Medications   Prior to Admission medications   Medication Sig Start Date End Date Taking? Authorizing Provider  albuterol (PROVENTIL HFA;VENTOLIN HFA) 108 (90 BASE) MCG/ACT inhaler Inhale 2 puffs into the lungs every 6 (six) hours as needed for wheezing or shortness of breath.     Historical Provider, MD  cholecalciferol (VITAMIN  D) 1000 UNITS tablet Take 1,000 Units by mouth daily.    Historical Provider, MD  Fexofenadine HCl (ALLERGY 24-HR PO) Take 1 tablet by mouth daily as needed (allergies).    Historical Provider, MD  HYDROcodone-acetaminophen (HYCET) 7.5-325 mg/15 ml solution Take 15 mLs by mouth every 4 (four) hours as needed for moderate pain or severe pain. Patient not taking: Reported on 08/21/2014 12/05/13   Garlon Hatchet, PA-C  ibuprofen (ADVIL,MOTRIN) 200 MG tablet Take 400 mg by mouth every 6 (six) hours as needed for mild pain.    Historical Provider, MD  ondansetron (ZOFRAN-ODT) 8 MG disintegrating tablet Take 1 tablet (8 mg total) by mouth every 8 (eight) hours as needed for nausea or vomiting. 08/21/14   Benjiman Core, MD   BP 120/66 mmHg  Pulse 77  Temp(Src) 97.8 F (36.6 C) (Oral)  Resp 16  Ht 5\' 10"  (1.778 m)  Wt 298 lb (135.172 kg)  BMI 42.76 kg/m2  SpO2 98%  LMP 07/23/2014 Physical Exam  Nursing note and vitals reviewed.  29 year old female, resting comfortably and in no acute distress. Vital signs are normal. Oxygen saturation is 98%, which is normal. Head is normocephalic and atraumatic. PERRLA, EOMI. Oropharynx is clear. Neck is nontender and supple without adenopathy or JVD. Back is nontender and there is  no CVA tenderness. Lungs are clear without rales, wheezes, or rhonchi. Chest is nontender. Heart has regular rate and rhythm without murmur. Abdomen is soft, flat, nontender without masses or hepatosplenomegaly and peristalsis is hypoactive. Extremities have no cyanosis or edema, full range of motion is present. Skin is warm and dry without rash. Neurologic: Mental status is normal, cranial nerves are intact, there are no motor or sensory deficits.  ED Course  Procedures (including critical care time) Labs Review Results for orders placed or performed during the hospital encounter of 08/22/14  CBC with Differential  Result Value Ref Range   WBC 6.3 4.0 - 10.5 K/uL   RBC  4.70 3.87 - 5.11 MIL/uL   Hemoglobin 12.2 12.0 - 15.0 g/dL   HCT 16.1 09.6 - 04.5 %   MCV 82.8 78.0 - 100.0 fL   MCH 26.0 26.0 - 34.0 pg   MCHC 31.4 30.0 - 36.0 g/dL   RDW 40.9 (H) 81.1 - 91.4 %   Platelets 288 150 - 400 K/uL   Neutrophils Relative % 68 43 - 77 %   Neutro Abs 4.2 1.7 - 7.7 K/uL   Lymphocytes Relative 23 12 - 46 %   Lymphs Abs 1.5 0.7 - 4.0 K/uL   Monocytes Relative 8 3 - 12 %   Monocytes Absolute 0.5 0.1 - 1.0 K/uL   Eosinophils Relative 1 0 - 5 %   Eosinophils Absolute 0.1 0.0 - 0.7 K/uL   Basophils Relative 0 0 - 1 %   Basophils Absolute 0.0 0.0 - 0.1 K/uL  Comprehensive metabolic panel  Result Value Ref Range   Sodium 136 135 - 145 mmol/L   Potassium 3.5 3.5 - 5.1 mmol/L   Chloride 102 101 - 111 mmol/L   CO2 26 22 - 32 mmol/L   Glucose, Bld 92 65 - 99 mg/dL   BUN 8 6 - 20 mg/dL   Creatinine, Ser 7.82 0.44 - 1.00 mg/dL   Calcium 8.5 (L) 8.9 - 10.3 mg/dL   Total Protein 7.5 6.5 - 8.1 g/dL   Albumin 3.6 3.5 - 5.0 g/dL   AST 20 15 - 41 U/L   ALT 13 (L) 14 - 54 U/L   Alkaline Phosphatase 69 38 - 126 U/L   Total Bilirubin 0.3 0.3 - 1.2 mg/dL   GFR calc non Af Amer >60 >60 mL/min   GFR calc Af Amer >60 >60 mL/min   Anion gap 8 5 - 15  Lipase, blood  Result Value Ref Range   Lipase 21 (L) 22 - 51 U/L   MDM   Final diagnoses:  Non-intractable vomiting with nausea, vomiting of unspecified type    Recurrent nausea and vomiting which is likely due to a viral illness. She will be given additional IV fluids and another dose of ondansetron. Old records are reviewed confirming ED visit on 08/21/2014 with discharge at 9:15 AM.  She feels better after above noted treatment. Laboratory workup is unremarkable. She is discharged with a prescription for ondansetron and is encouraged to get the prescription filled.  Dione Booze, MD 08/22/14 [phone]

## 2014-08-22 NOTE — Discharge Instructions (Signed)
Nausea and Vomiting °Nausea is a sick feeling that often comes before throwing up (vomiting). Vomiting is a reflex where stomach contents come out of your mouth. Vomiting can cause severe loss of body fluids (dehydration). Children and elderly adults can become dehydrated quickly, especially if they also have diarrhea. Nausea and vomiting are symptoms of a condition or disease. It is important to find the cause of your symptoms. °CAUSES  °· Direct irritation of the stomach lining. This irritation can result from increased acid production (gastroesophageal reflux disease), infection, food poisoning, taking certain medicines (such as nonsteroidal anti-inflammatory drugs), alcohol use, or tobacco use. °· Signals from the brain. These signals could be caused by a headache, heat exposure, an inner ear disturbance, increased pressure in the brain from injury, infection, a tumor, or a concussion, pain, emotional stimulus, or metabolic problems. °· An obstruction in the gastrointestinal tract (bowel obstruction). °· Illnesses such as diabetes, hepatitis, gallbladder problems, appendicitis, kidney problems, cancer, sepsis, atypical symptoms of a heart attack, or eating disorders. °· Medical treatments such as chemotherapy and radiation. °· Receiving medicine that makes you sleep (general anesthetic) during surgery. °DIAGNOSIS °Your caregiver may ask for tests to be done if the problems do not improve after a few days. Tests may also be done if symptoms are severe or if the reason for the nausea and vomiting is not clear. Tests may include: °· Urine tests. °· Blood tests. °· Stool tests. °· Cultures (to look for evidence of infection). °· X-rays or other imaging studies. °Test results can help your caregiver make decisions about treatment or the need for additional tests. °TREATMENT °You need to stay well hydrated. Drink frequently but in small amounts. You may wish to drink water, sports drinks, clear broth, or eat frozen  ice pops or gelatin dessert to help stay hydrated. When you eat, eating slowly may help prevent nausea. There are also some antinausea medicines that may help prevent nausea. °HOME CARE INSTRUCTIONS  °· Take all medicine as directed by your caregiver. °· If you do not have an appetite, do not force yourself to eat. However, you must continue to drink fluids. °· If you have an appetite, eat a normal diet unless your caregiver tells you differently. °¨ Eat a variety of complex carbohydrates (rice, wheat, potatoes, bread), lean meats, yogurt, fruits, and vegetables. °¨ Avoid high-fat foods because they are more difficult to digest. °· Drink enough water and fluids to keep your urine clear or pale yellow. °· If you are dehydrated, ask your caregiver for specific rehydration instructions. Signs of dehydration may include: °¨ Severe thirst. °¨ Dry lips and mouth. °¨ Dizziness. °¨ Dark urine. °¨ Decreasing urine frequency and amount. °¨ Confusion. °¨ Rapid breathing or pulse. °SEEK IMMEDIATE MEDICAL CARE IF:  °· You have blood or brown flecks (like coffee grounds) in your vomit. °· You have black or bloody stools. °· You have a severe headache or stiff neck. °· You are confused. °· You have severe abdominal pain. °· You have chest pain or trouble breathing. °· You do not urinate at least once every 8 hours. °· You develop cold or clammy skin. °· You continue to vomit for longer than 24 to 48 hours. °· You have a fever. °MAKE SURE YOU:  °· Understand these instructions. °· Will watch your condition. °· Will get help right away if you are not doing well or get worse. °Document Released: 02/21/2005 Document Revised: 05/16/2011 Document Reviewed: 07/21/2010 °ExitCare® Patient Information ©2015 [employer]. This information is not intended   to replace advice given to you by your health care provider. Make sure you discuss any questions you have with your health care provider. ° °Ondansetron tablets °What is this  medicine? °ONDANSETRON (on DAN se tron) is used to treat nausea and vomiting caused by chemotherapy. It is also used to prevent or treat nausea and vomiting after surgery. °This medicine may be used for other purposes; ask your health care provider or pharmacist if you have questions. °COMMON BRAND NAME(S): Zofran °What should I tell my health care provider before I take this medicine? °They need to know if you have any of these conditions: °-heart disease °-history of irregular heartbeat °-liver disease °-low levels of magnesium or potassium in the blood °-an unusual or allergic reaction to ondansetron, granisetron, other medicines, foods, dyes, or preservatives °-pregnant or trying to get pregnant °-breast-feeding °How should I use this medicine? °Take this medicine by mouth with a glass of water. Follow the directions on your prescription label. Take your doses at regular intervals. Do not take your medicine more often than directed. °Talk to your pediatrician regarding the use of this medicine in children. Special care may be needed. °Overdosage: If you think you have taken too much of this medicine contact a poison control center or emergency room at once. °NOTE: This medicine is only for you. Do not share this medicine with others. °What if I miss a dose? °If you miss a dose, take it as soon as you can. If it is almost time for your next dose, take only that dose. Do not take double or extra doses. °What may interact with this medicine? °Do not take this medicine with any of the following medications: °-apomorphine °-certain medicines for fungal infections like fluconazole, itraconazole, ketoconazole, posaconazole, voriconazole °-cisapride °-dofetilide °-dronedarone °-pimozide °-thioridazine °-ziprasidone °This medicine may also interact with the following medications: °-carbamazepine °-certain medicines for depression, anxiety, or psychotic disturbances °-fentanyl °-linezolid °-MAOIs like Carbex, Eldepryl,  Marplan, Nardil, and Parnate °-methylene blue (injected into a vein) °-other medicines that prolong the QT interval (cause an abnormal heart rhythm) °-phenytoin °-rifampicin °-tramadol °This list may not describe all possible interactions. Give your health care provider a list of all the medicines, herbs, non-prescription drugs, or dietary supplements you use. Also tell them if you smoke, drink alcohol, or use illegal drugs. Some items may interact with your medicine. °What should I watch for while using this medicine? °Check with your doctor or health care professional right away if you have any sign of an allergic reaction. °What side effects may I notice from receiving this medicine? °Side effects that you should report to your doctor or health care professional as soon as possible: °-allergic reactions like skin rash, itching or hives, swelling of the face, lips or tongue °-breathing problems °-confusion °-dizziness °-fast or irregular heartbeat °-feeling faint or lightheaded, falls °-fever and chills °-loss of balance or coordination °-seizures °-sweating °-swelling of the hands or feet °-tightness in the chest °-tremors °-unusually weak or tired °Side effects that usually do not require medical attention (report to your doctor or health care professional if they continue or are bothersome): °-constipation or diarrhea °-headache °This list may not describe all possible side effects. Call your doctor for medical advice about side effects. You may report side effects to FDA at 1-800-FDA-1088. °Where should I keep my medicine? °Keep out of the reach of children. °Store between 2 and 30 degrees C (36 and 86 degrees F). Throw away any unused medicine after the expiration   date. °NOTE: This sheet is a summary. It may not cover all possible information. If you have questions about this medicine, talk to your doctor, pharmacist, or health care provider. °© 2015, Elsevier/[employer]. (2012-11-28 16:27:45) ° °

## 2014-08-22 NOTE — ED Notes (Signed)
Dr. Glick at the bedside.  

## 2014-08-22 NOTE — ED Notes (Signed)
Patient reports she was recently seen for nausea and perscribed zofran to take at home. 1 emesis episode today, last dose of anti nausea medication was prior to discharge, none taken at home.

## 2014-09-20 ENCOUNTER — Encounter (HOSPITAL_COMMUNITY): Payer: Self-pay | Admitting: Emergency Medicine

## 2014-09-20 ENCOUNTER — Emergency Department (HOSPITAL_COMMUNITY)
Admission: EM | Admit: 2014-09-20 | Discharge: 2014-09-20 | Disposition: A | Discharge status: Home / Self Care | Payer: Medicaid Other | Attending: Emergency Medicine | Admitting: Emergency Medicine

## 2014-09-20 DIAGNOSIS — Z79899 Other long term (current) drug therapy: Secondary | ICD-10-CM | POA: Insufficient documentation

## 2014-09-20 DIAGNOSIS — Y9289 Other specified places as the place of occurrence of the external cause: Secondary | ICD-10-CM | POA: Insufficient documentation

## 2014-09-20 DIAGNOSIS — Z720 Tobacco use: Secondary | ICD-10-CM | POA: Insufficient documentation

## 2014-09-20 DIAGNOSIS — S8391XA Sprain of unspecified site of right knee, initial encounter: Secondary | ICD-10-CM

## 2014-09-20 DIAGNOSIS — Y998 Other external cause status: Secondary | ICD-10-CM | POA: Insufficient documentation

## 2014-09-20 DIAGNOSIS — I10 Essential (primary) hypertension: Secondary | ICD-10-CM | POA: Insufficient documentation

## 2014-09-20 DIAGNOSIS — Z8709 Personal history of other diseases of the respiratory system: Secondary | ICD-10-CM | POA: Insufficient documentation

## 2014-09-20 DIAGNOSIS — Y9302 Activity, running: Secondary | ICD-10-CM | POA: Insufficient documentation

## 2014-09-20 MED ORDER — IBUPROFEN 800 MG PO TABS
800.0000 mg | ORAL_TABLET | Freq: Once | ORAL | Status: AC
  Administered 2014-09-20: 800 mg via ORAL
  Filled 2014-09-20: qty 1

## 2014-09-20 NOTE — ED Notes (Signed)
Pt reports right knee pain. Pt reports that she had to run to her car yesterday and may have injured it that way. Pt reports pain when she puts weight on her leg.

## 2014-09-20 NOTE — ED Provider Notes (Signed)
CSN: [id number]     Arrival date & time 09/20/14  4098 History   First MD Initiated Contact with Patient 09/20/14 0830     Chief Complaint  Patient presents with  . Knee Pain   Patient is a 29 y.o. female presenting with knee pain. The history is provided by the patient.  Knee Pain Location:  Knee Time since incident:  1 day Knee location:  R knee Pain details:    Quality:  Aching   Radiates to:  Does not radiate   Severity:  Mild   Onset quality:  Gradual   Timing:  Constant   Progression:  Unchanged Chronicity:  New Relieved by:  Rest Worsened by:  Bearing weight pt reports she was in a hurry yesterday and had to run to her car and since that time she has had some pain in right knee No fall/trauma No other complaints Denies h/o VTE  Past Medical History  Diagnosis Date  . No pertinent past medical history   . Bronchitis   . Hypertension     pih  . Pre-eclampsia    Past Surgical History  Procedure Laterality Date  . No past surgeries     Family History  Problem Relation Age of Onset  . Diabetes Mother   . Hypertension Mother   . Hyperlipidemia Mother   . Gout Father    History  Substance Use Topics  . Smoking status: Current Some Day Smoker  . Smokeless tobacco: Never Used  . Alcohol Use: Yes     Comment: occ   OB History    Gravida Para Term Preterm AB TAB SAB Ectopic Multiple Living   0 0 0 0 0 0 1     Review of Systems  Respiratory: Negative for shortness of breath.   Cardiovascular: Negative for chest pain.  Musculoskeletal: Positive for arthralgias.      Allergies  Review of patient's allergies indicates no known allergies.  Home Medications   Prior to Admission medications   Medication Sig Start Date End Date Taking? Authorizing Provider  albuterol (PROVENTIL HFA;VENTOLIN HFA) 108 (90 BASE) MCG/ACT inhaler Inhale 2 puffs into the lungs every 6 (six) hours as needed for wheezing or shortness of breath.     Historical Provider, MD   cholecalciferol (VITAMIN D) 1000 UNITS tablet Take 1,000 Units by mouth daily.    Historical Provider, MD  ondansetron (ZOFRAN) 4 MG tablet Take 1 tablet (4 mg total) by mouth every 6 (six) hours. 08/22/14   Dione Booze, MD  ondansetron (ZOFRAN-ODT) 8 MG disintegrating tablet Take 1 tablet (8 mg total) by mouth every 8 (eight) hours as needed for nausea or vomiting. 08/21/14   Benjiman Core, MD   BP 117/66 mmHg  Pulse 74  Temp(Src) 97.8 F (36.6 C) (Oral)  Resp 18  SpO2 99%  LMP 08/28/2014 (Approximate) Physical Exam CONSTITUTIONAL: Well developed/well nourished HEAD: Normocephalic/atraumatic NECK: supple no meningeal signs SPINE/BACK:entire spine nontender CV: S1/S2 noted, no murmurs/rubs/gallops noted LUNGS: Lungs are clear to auscultation bilaterally, no apparent distress ABDOMEN: soft NEURO: Pt is awake/alert/appropriate, moves all extremitiesx4.  No facial droop.   EXTREMITIES: pulses normal/equal, full ROM, small effusion noted on right knee, no deformity/erythema/warmth.  She can flex/extend right knee.  No calf tenderness.  No other bony tenderness to right LE. No LE edema SKIN: warm, color normal PSYCH: no abnormalities of mood noted, alert and oriented to situation  ED Course  Procedures  Advised NWB for next week, NSAIDs  and ortho f/u if no improvement   MDM   Final diagnoses:  Sprain of right knee, initial encounter    Nursing notes including past medical history and social history reviewed and considered in documentation     Zadie Rhineonald Congetta Odriscoll, MD 09/20/14 [phone]

## 2014-09-20 NOTE — Discharge Instructions (Signed)
Joint Sprain °A sprain is a tear or stretch in the ligaments that hold a joint together. Severe sprains may need as long as 3-6 weeks of immobilization and/or exercises to heal completely. Sprained joints should be rested and protected. If not, they can become unstable and prone to re-injury. Proper treatment can reduce your pain, shorten the period of disability, and reduce the risk of repeated injuries. °TREATMENT  °· Rest and elevate the injured joint to reduce pain and swelling. °· Apply ice packs to the injury for 20-30 minutes every 2-3 hours for the next 2-3 days. °· Keep the injury wrapped in a compression bandage or splint as long as the joint is painful or as instructed by your caregiver. °· Do not use the injured joint until it is completely healed to prevent re-injury and chronic instability. Follow the instructions of your caregiver. °· Long-term sprain management may require exercises and/or treatment by a physical therapist. Taping or special braces may help stabilize the joint until it is completely better. °SEEK MEDICAL CARE IF:  °· You develop increased pain or swelling of the joint. °· You develop increasing redness and warmth of the joint. °· You develop a fever. °· It becomes stiff. °· Your hand or foot gets cold or numb. °Document Released: 03/31/2004 Document Revised: 05/16/2011 Document Reviewed: 03/10/2008 °ExitCare® Patient Information ©2015 [employer]. This information is not intended to replace advice given to you by your health care provider. Make sure you discuss any questions you have with your health care provider. ° °

## 2015-05-07 ENCOUNTER — Encounter (HOSPITAL_COMMUNITY): Payer: Self-pay

## 2015-05-07 ENCOUNTER — Emergency Department (HOSPITAL_COMMUNITY)
Admission: EM | Admit: 2015-05-07 | Discharge: 2015-05-07 | Disposition: A | Discharge status: Home / Self Care | Payer: Medicaid Other | Attending: Emergency Medicine | Admitting: Emergency Medicine

## 2015-05-07 DIAGNOSIS — L03012 Cellulitis of left finger: Secondary | ICD-10-CM | POA: Insufficient documentation

## 2015-05-07 DIAGNOSIS — I10 Essential (primary) hypertension: Secondary | ICD-10-CM | POA: Insufficient documentation

## 2015-05-07 DIAGNOSIS — Z79899 Other long term (current) drug therapy: Secondary | ICD-10-CM | POA: Insufficient documentation

## 2015-05-07 DIAGNOSIS — F17200 Nicotine dependence, unspecified, uncomplicated: Secondary | ICD-10-CM | POA: Insufficient documentation

## 2015-05-07 NOTE — ED Provider Notes (Signed)
CSN: [id number]     Arrival date & time 05/07/15  1016 History  By signing my name below, I, Freida Busman, attest that this documentation has been prepared under the direction and in the presence of non-physician practitioner, Danelle Berry, PA-C. Electronically Signed: Freida Busman, Scribe. 05/07/2015. 11:53 AM.      Chief Complaint  Patient presents with  . Finger Injury    The history is provided by the patient. No language interpreter was used.    HPI Comments:  Lisa Charles is a 30 y.o. female who presents to the Emergency Department complaining of gradually worsening throbbing pain to her left index finger x 1 week. She rates her pain a 6-7/10. She denies recent injury, drainage, swelling and redness to the finger.  No alleviating factors noted.  No PCP  Past Medical History  Diagnosis Date  . No pertinent past medical history   . Bronchitis   . Hypertension     pih  . Pre-eclampsia    Past Surgical History  Procedure Laterality Date  . No past surgeries     Family History  Problem Relation Age of Onset  . Diabetes Mother   . Hypertension Mother   . Hyperlipidemia Mother   . Gout Father    Social History  Substance Use Topics  . Smoking status: Current Some Day Smoker  . Smokeless tobacco: Never Used  . Alcohol Use: Yes     Comment: occ   OB History    Gravida Para Term Preterm AB TAB SAB Ectopic Multiple Living   0 0 0 0 0 0 1     Review of Systems  Constitutional: Negative for fever and chills.  Musculoskeletal: Positive for myalgias and arthralgias.       Finger pain  Neurological: Negative for weakness and numbness.  All other systems reviewed and are negative.   Allergies  Review of patient's allergies indicates no known allergies.  Home Medications   Prior to Admission medications   Medication Sig Start Date End Date Taking? Authorizing Provider  albuterol (PROVENTIL HFA;VENTOLIN HFA) 108 (90 BASE) MCG/ACT inhaler Inhale 2 puffs into  the lungs every 6 (six) hours as needed for wheezing or shortness of breath.     Historical Provider, MD  cholecalciferol (VITAMIN D) 1000 UNITS tablet Take 1,000 Units by mouth daily.    Historical Provider, MD  ondansetron (ZOFRAN) 4 MG tablet Take 1 tablet (4 mg total) by mouth every 6 (six) hours. 08/22/14   Dione Booze, MD  ondansetron (ZOFRAN-ODT) 8 MG disintegrating tablet Take 1 tablet (8 mg total) by mouth every 8 (eight) hours as needed for nausea or vomiting. 08/21/14   Benjiman Core, MD   BP 120/66 mmHg  Pulse 71  Temp(Src) 98.1 F (36.7 C) (Oral)  Resp 16  SpO2 100% Physical Exam  Constitutional: She is oriented to person, place, and time. She appears well-developed and well-nourished. No distress.  HENT:  Head: Normocephalic and atraumatic.  Right Ear: External ear normal.  Left Ear: External ear normal.  Nose: Nose normal.  Mouth/Throat: Oropharynx is clear and moist. No oropharyngeal exudate.  Eyes: Conjunctivae and EOM are normal. Pupils are equal, round, and reactive to light. Right eye exhibits no discharge. Left eye exhibits no discharge. No scleral icterus.  Neck: Normal range of motion. Neck supple. No JVD present. No tracheal deviation present.  Cardiovascular: Normal rate and regular rhythm.   Pulmonary/Chest: Effort normal and breath sounds normal. No stridor. No  respiratory distress.  Musculoskeletal: Normal range of motion. She exhibits tenderness. She exhibits no edema.  Lymphadenopathy:    She has no cervical adenopathy.  Neurological: She is alert and oriented to person, place, and time. She exhibits normal muscle tone. Coordination normal.  Skin: Skin is warm and dry. No rash noted. She is not diaphoretic. No erythema. No pallor.  Left index finger, ttp at the tip, with discolored area (brown) located at tip of finger and visible through distal nail.  No edema or erythema of finger.  Psychiatric: She has a normal mood and affect. Her behavior is normal.  Judgment and thought content normal.  Nursing note and vitals reviewed.   ED Course  Procedures   INCISION AND DRAINAGE Performed by: Danelle Berry Consent: Verbal consent obtained. Risks and benefits: risks, benefits and alternatives were discussed Time out performed prior to procedure Type: abscess Body area: left index finger Anesthesia: none Incision was made with a 21 gage needle and 11 blade Local anesthetic:none Anesthetic total: none Complexity: simple Drainage: purulent Drainage amount: small amount Packing material: n/a Patient tolerance: Patient tolerated the procedure well with no immediate complications.     DIAGNOSTIC STUDIES:  Oxygen Saturation is 99% on RA, normal by my interpretation.    COORDINATION OF CARE:  11:50 AM Discussed treatment plan with pt at bedside and pt agreed to plan.  Labs Review Labs Reviewed - No data to display  Imaging Review No results found. I have personally reviewed and evaluated these images and lab results as part of my medical decision-making.   MDM   Small paronychia to left index finger, I&D performed, pt does not appear to have cellulitis or spreading infection, normal ROM of finger, no erythema, no edema.  Wound care reviewed and printed for the pt. She was discharged in good condition, pain improved with I&D  Final diagnoses:  Paronychia, left   I personally performed the services described in this documentation, which was scribed in my presence. The recorded information has been reviewed and is accurate.      Danelle Berry, PA-C 05/10/15 1610  Gwyneth Sprout, MD 05/10/15 2132

## 2015-05-07 NOTE — ED Notes (Signed)
Declined W/C at D/C and was escorted to lobby by RN. 

## 2015-05-07 NOTE — ED Notes (Signed)
Patient here with left hand index finger numbness at tip, denies injury

## 2015-05-07 NOTE — Discharge Instructions (Signed)
Fingertip Infection °When an infection is around the nail, it is called a paronychia. When it appears over the tip of the finger, it is called a felon. These infections are due to minor injuries or cracks in the skin. If they are not treated properly, they can lead to bone infection and permanent damage to the fingernail. °Incision and drainage is necessary if a pus pocket (an abscess) has formed. Antibiotics and pain medicine may also be needed. Keep your hand elevated for the next 2-3 days to reduce swelling and pain. If a pack was placed in the abscess, it should be removed in 1-2 days by your caregiver. Soak the finger in warm water for 20 minutes 4 times daily to help promote drainage. °Keep the hands as dry as possible. Wear protective gloves with cotton liners. See your caregiver for follow-up care as recommended.  °HOME CARE INSTRUCTIONS  °· Keep wound clean, dry and dressed as suggested by your caregiver. °· Soak in warm salt water for fifteen minutes, four times per day for bacterial infections. °· Your caregiver will prescribe an antibiotic if a bacterial infection is suspected. Take antibiotics as directed and finish the prescription, even if the problem appears to be improving before the medicine is gone. °· Only take over-the-counter or prescription medicines for pain, discomfort, or fever as directed by your caregiver. °SEEK IMMEDIATE MEDICAL CARE IF: °· There is redness, swelling, or increasing pain in the wound. °· Pus or any other unusual drainage is coming from the wound. °· An unexplained oral temperature above 102° F (38.9° C) develops. °· You notice a foul smell coming from the wound or dressing. °MAKE SURE YOU:  °· Understand these instructions. °· Monitor your condition. °· Contact your caregiver if you are getting worse or not improving. °  °This information is not intended to replace advice given to you by your health care provider. Make sure you discuss any questions you have with your  health care provider. °  °Document Released: 03/31/2004 Document Revised: 05/16/2011 Document Reviewed: 08/11/2014 °Elsevier Interactive Patient Education ©2016 [employer]. ° °Paronychia  °Paronychia is an infection of the skin. It happens near a fingernail or toenail. It may cause pain and swelling around the nail. Usually, it is not serious and it clears up with treatment. °HOME CARE °· Soak the fingers or toes in warm water as told by your doctor. You may be told to do this for 20 minutes, 2-3 times a day. °· Keep the area dry when you are not soaking it. °· Take medicines only as told by your doctor. °· If you were given an antibiotic medicine, finish all of it even if you start to feel better. °· Keep the affected area clean. °· Do not try to drain a fluid-filled bump yourself. °· Wear rubber gloves when putting your hands in water. °· Wear gloves if your hands might touch cleaners or chemicals. °· Follow your doctor's instructions about: °¨ Wound care. °¨ Bandage (dressing) changes and removal. °GET HELP IF: °· Your symptoms get worse or do not improve. °· You have a fever or chills. °· You have redness spreading from the affected area. °· You have more fluid, blood, or pus coming from the affected area. °· Your finger or knuckle is swollen or is hard to move. °  °This information is not intended to replace advice given to you by your health care provider. Make sure you discuss any questions you have with your health care provider. °  °  Document Released: 02/09/2009 Document Revised: 07/08/2014 Document Reviewed: 01/29/2014 Elsevier Interactive Patient Education [employer]2016 [employer]

## 2015-07-28 ENCOUNTER — Encounter (HOSPITAL_COMMUNITY): Payer: Self-pay | Admitting: *Deleted

## 2015-07-28 ENCOUNTER — Emergency Department (HOSPITAL_COMMUNITY)
Admission: EM | Admit: 2015-07-28 | Discharge: 2015-07-28 | Disposition: A | Discharge status: Home / Self Care | Payer: MEDICAID | Attending: Emergency Medicine | Admitting: Emergency Medicine

## 2015-07-28 DIAGNOSIS — F17200 Nicotine dependence, unspecified, uncomplicated: Secondary | ICD-10-CM | POA: Insufficient documentation

## 2015-07-28 DIAGNOSIS — M545 Low back pain, unspecified: Secondary | ICD-10-CM

## 2015-07-28 DIAGNOSIS — Z79899 Other long term (current) drug therapy: Secondary | ICD-10-CM | POA: Insufficient documentation

## 2015-07-28 DIAGNOSIS — I10 Essential (primary) hypertension: Secondary | ICD-10-CM | POA: Insufficient documentation

## 2015-07-28 DIAGNOSIS — Z8709 Personal history of other diseases of the respiratory system: Secondary | ICD-10-CM | POA: Insufficient documentation

## 2015-07-28 MED ORDER — TRAMADOL HCL 50 MG PO TABS
50.0000 mg | ORAL_TABLET | Freq: Once | ORAL | Status: AC
  Administered 2015-07-28: 50 mg via ORAL
  Filled 2015-07-28: qty 1

## 2015-07-28 MED ORDER — KETOROLAC TROMETHAMINE 60 MG/2ML IM SOLN
60.0000 mg | Freq: Once | INTRAMUSCULAR | Status: AC
  Administered 2015-07-28: 60 mg via INTRAMUSCULAR
  Filled 2015-07-28: qty 2

## 2015-07-28 MED ORDER — DEXAMETHASONE SODIUM PHOSPHATE 10 MG/ML IJ SOLN
6.0000 mg | Freq: Once | INTRAMUSCULAR | Status: AC
  Administered 2015-07-28: 6 mg via INTRAMUSCULAR
  Filled 2015-07-28: qty 1

## 2015-07-28 MED ORDER — KETOROLAC TROMETHAMINE 10 MG PO TABS: 10.0000 mg | ORAL_TABLET | Freq: Four times a day (QID) | ORAL | Status: DC | PRN

## 2015-07-28 MED ORDER — IBUPROFEN 800 MG PO TABS: 800.0000 mg | ORAL_TABLET | Freq: Three times a day (TID) | ORAL | Status: DC

## 2015-07-28 MED ORDER — CYCLOBENZAPRINE HCL 10 MG PO TABS: 10.0000 mg | ORAL_TABLET | Freq: Two times a day (BID) | ORAL | Status: DC | PRN

## 2015-07-28 MED ORDER — CYCLOBENZAPRINE HCL 10 MG PO TABS
10.0000 mg | ORAL_TABLET | Freq: Once | ORAL | Status: AC
  Administered 2015-07-28: 10 mg via ORAL
  Filled 2015-07-28: qty 1

## 2015-07-28 NOTE — Discharge Instructions (Signed)
Back Pain, Adult °Back pain is very common in adults. The cause of back pain is rarely dangerous and the pain often gets better over time. The cause of your back pain may not be known. Some common causes of back pain include: °· Strain of the muscles or ligaments supporting the spine. °· Wear and tear (degeneration) of the spinal disks. °· Arthritis. °· Direct injury to the back. °For many people, back pain may return. Since back pain is rarely dangerous, most people can learn to manage this condition on their own. °HOME CARE INSTRUCTIONS °Watch your back pain for any changes. The following actions may help to lessen any discomfort you are feeling: °· Remain active. It is stressful on your back to sit or stand in one place for long periods of time. Do not sit, drive, or stand in one place for more than 30 minutes at a time. Take short walks on even surfaces as soon as you are able. Try to increase the length of time you walk each day. °· Exercise regularly as directed by your health care provider. Exercise helps your back heal faster. It also helps avoid future injury by keeping your muscles strong and flexible. °· Do not stay in bed. Resting more than 1-2 days can delay your recovery. °· Pay attention to your body when you bend and lift. The most comfortable positions are those that put less stress on your recovering back. Always use proper lifting techniques, including: °· Bending your knees. °· Keeping the load close to your body. °· Avoiding twisting. °· Find a comfortable position to sleep. Use a firm mattress and lie on your side with your knees slightly bent. If you lie on your back, put a pillow under your knees. °· Avoid feeling anxious or stressed. Stress increases muscle tension and can worsen back pain. It is important to recognize when you are anxious or stressed and learn ways to manage it, such as with exercise. °· Take medicines only as directed by your health care provider. Over-the-counter  medicines to reduce pain and inflammation are often the most helpful. Your health care provider may prescribe muscle relaxant drugs. These medicines help dull your pain so you can more quickly return to your normal activities and healthy exercise. °· Apply ice to the injured area: °· Put ice in a plastic bag. °· Place a towel between your skin and the bag. °· Leave the ice on for 20 minutes, 2-3 times a day for the first 2-3 days. After that, ice and heat may be alternated to reduce pain and spasms. °· Maintain a healthy weight. Excess weight puts extra stress on your back and makes it difficult to maintain good posture. °SEEK MEDICAL CARE IF: °· You have pain that is not relieved with rest or medicine. °· You have increasing pain going down into the legs or buttocks. °· You have pain that does not improve in one week. °· You have night pain. °· You lose weight. °· You have a fever or chills. °SEEK IMMEDIATE MEDICAL CARE IF:  °· You develop new bowel or bladder control problems. °· You have unusual weakness or numbness in your arms or legs. °· You develop nausea or vomiting. °· You develop abdominal pain. °· You feel faint. °  °This information is not intended to replace advice given to you by your health care provider. Make sure you discuss any questions you have with your health care provider. °  °Document Released: 02/21/2005 Document Revised: 03/14/2014 Document Reviewed: 06/25/2013 °[employer] Interactive Patient Education ©2016 [employer]  Low Back Sprain With Rehab A sprain is an injury in which a ligament is torn. The ligaments of the lower back are vulnerable to sprains. However, they are strong and require great force to be injured. These ligaments are important for stabilizing the spinal column. Sprains are classified into three categories. Grade 1 sprains cause pain, but the tendon is not lengthened. Grade 2 sprains include a lengthened ligament, due to the ligament being stretched or partially  ruptured. With grade 2 sprains there is still function, although the function may be decreased. Grade 3 sprains involve a complete tear of the tendon or muscle, and function is usually impaired. SYMPTOMS   Severe pain in the lower back.  Sometimes, a feeling of a "pop," "snap," or tear, at the time of injury.  Tenderness and sometimes swelling at the injury site.  Uncommonly, bruising (contusion) within 48 hours of injury.  Muscle spasms in the back. CAUSES  Low back sprains occur when a force is placed on the ligaments that is greater than they can handle. Common causes of injury include:  Performing a stressful act while off-balance.  Repetitive stressful activities that involve movement of the lower back.  Direct hit (trauma) to the lower back. RISK INCREASES WITH:  Contact sports (football, wrestling).  Collisions (major skiing accidents).  Sports that require throwing or lifting (baseball, weightlifting).  Sports involving twisting of the spine (gymnastics, diving, tennis, golf).  Poor strength and flexibility.  Inadequate protection.  Previous back injury or surgery (especially fusion). PREVENTION  Wear properly fitted and padded protective equipment.  Warm up and stretch properly before activity.  Allow for adequate recovery between workouts.  Maintain physical fitness:  Strength, flexibility, and endurance.  Cardiovascular fitness.  Maintain a healthy body weight. PROGNOSIS  If treated properly, low back sprains usually heal with non-surgical treatment. The length of time for healing depends on the severity of the injury.  RELATED COMPLICATIONS   Recurring symptoms, resulting in a chronic problem.  Chronic inflammation and pain in the low back.  Delayed healing or resolution of symptoms, especially if activity is resumed too soon.  Prolonged impairment.  Unstable or arthritic joints of the low back. TREATMENT  Treatment first involves the use of  ice and medicine, to reduce pain and inflammation. The use of strengthening and stretching exercises may help reduce pain with activity. These exercises may be performed at home or with a therapist. Severe injuries may require referral to a therapist for further evaluation and treatment, such as ultrasound. Your caregiver may advise that you wear a back brace or corset, to help reduce pain and discomfort. Often, prolonged bed rest results in greater harm then benefit. Corticosteroid injections may be recommended. However, these should be reserved for the most serious cases. It is important to avoid using your back when lifting objects. At night, sleep on your back on a firm mattress, with a pillow placed under your knees. If non-surgical treatment is unsuccessful, surgery may be needed.  MEDICATION   If pain medicine is needed, nonsteroidal anti-inflammatory medicines (aspirin and ibuprofen), or other minor pain relievers (acetaminophen), are often advised.  Do not take pain medicine for 7 days before surgery.  Prescription pain relievers may be given, if your caregiver thinks they are needed. Use only as directed and only as much as you need.  Ointments applied to the skin may be helpful.  Corticosteroid injections may be given by your caregiver. These injections should be reserved for the most  serious cases, because they may only be given a certain number of times. HEAT AND COLD  Cold treatment (icing) should be applied for 10 to 15 minutes every 2 to 3 hours for inflammation and pain, and immediately after activity that aggravates your symptoms. Use ice packs or an ice massage.  Heat treatment may be used before performing stretching and strengthening activities prescribed by your caregiver, physical therapist, or athletic trainer. Use a heat pack or a warm water soak. SEEK MEDICAL CARE IF:   Symptoms get worse or do not improve in 2 to 4 weeks, despite treatment.  You develop numbness or  weakness in either leg.  You lose bowel or bladder function.  Any of the following occur after surgery: fever, increased pain, swelling, redness, drainage of fluids, or bleeding in the affected area.  New, unexplained symptoms develop. (Drugs used in treatment may produce side effects.) EXERCISES  RANGE OF MOTION (ROM) AND STRETCHING EXERCISES - Low Back Sprain Most people with lower back pain will find that their symptoms get worse with excessive bending forward (flexion) or arching at the lower back (extension). The exercises that will help resolve your symptoms will focus on the opposite motion.  Your physician, physical therapist or athletic trainer will help you determine which exercises will be most helpful to resolve your lower back pain. Do not complete any exercises without first consulting with your caregiver. Discontinue any exercises which make your symptoms worse, until you speak to your caregiver. If you have pain, numbness or tingling which travels down into your buttocks, leg or foot, the goal of the therapy is for these symptoms to move closer to your back and eventually resolve. Sometimes, these leg symptoms will get better, but your lower back pain may worsen. This is often an indication of progress in your rehabilitation. Be very alert to any changes in your symptoms and the activities in which you participated in the 24 hours prior to the change. Sharing this information with your caregiver will allow him or her to most efficiently treat your condition. These exercises may help you when beginning to rehabilitate your injury. Your symptoms may resolve with or without further involvement from your physician, physical therapist or athletic trainer. While completing these exercises, remember:   Restoring tissue flexibility helps normal motion to return to the joints. This allows healthier, less painful movement and activity.  An effective stretch should be held for at least 30  seconds.  A stretch should never be painful. You should only feel a gentle lengthening or release in the stretched tissue. FLEXION RANGE OF MOTION AND STRETCHING EXERCISES: STRETCH - Flexion, Single Knee to Chest   Lie on a firm bed or floor with both legs extended in front of you.  Keeping one leg in contact with the floor, bring your opposite knee to your chest. Hold your leg in place by either grabbing behind your thigh or at your knee.  Pull until you feel a gentle stretch in your low back. Hold __________ seconds.  Slowly release your grasp and repeat the exercise with the opposite side. Repeat __________ times. Complete this exercise __________ times per day.  STRETCH - Flexion, Double Knee to Chest  Lie on a firm bed or floor with both legs extended in front of you.  Keeping one leg in contact with the floor, bring your opposite knee to your chest.  Tense your stomach muscles to support your back and then lift your other knee to your chest. Hold  your legs in place by either grabbing behind your thighs or at your knees.  Pull both knees toward your chest until you feel a gentle stretch in your low back. Hold __________ seconds.  Tense your stomach muscles and slowly return one leg at a time to the floor. Repeat __________ times. Complete this exercise __________ times per day.  STRETCH - Low Trunk Rotation  Lie on a firm bed or floor. Keeping your legs in front of you, bend your knees so they are both pointed toward the ceiling and your feet are flat on the floor.  Extend your arms out to the side. This will stabilize your upper body by keeping your shoulders in contact with the floor.  Gently and slowly drop both knees together to one side until you feel a gentle stretch in your low back. Hold for __________ seconds.  Tense your stomach muscles to support your lower back as you bring your knees back to the starting position. Repeat the exercise to the other side. Repeat  __________ times. Complete this exercise __________ times per day  EXTENSION RANGE OF MOTION AND FLEXIBILITY EXERCISES: STRETCH - Extension, Prone on Elbows   Lie on your stomach on the floor, a bed will be too soft. Place your palms about shoulder width apart and at the height of your head.  Place your elbows under your shoulders. If this is too painful, stack pillows under your chest.  Allow your body to relax so that your hips drop lower and make contact more completely with the floor.  Hold this position for __________ seconds.  Slowly return to lying flat on the floor. Repeat __________ times. Complete this exercise __________ times per day.  RANGE OF MOTION - Extension, Prone Press Ups  Lie on your stomach on the floor, a bed will be too soft. Place your palms about shoulder width apart and at the height of your head.  Keeping your back as relaxed as possible, slowly straighten your elbows while keeping your hips on the floor. You may adjust the placement of your hands to maximize your comfort. As you gain motion, your hands will come more underneath your shoulders.  Hold this position __________ seconds.  Slowly return to lying flat on the floor. Repeat __________ times. Complete this exercise __________ times per day.  RANGE OF MOTION- Quadruped, Neutral Spine   Assume a hands and knees position on a firm surface. Keep your hands under your shoulders and your knees under your hips. You may place padding under your knees for comfort.  Drop your head and point your tailbone toward the ground below you. This will round out your lower back like an angry cat. Hold this position for __________ seconds.  Slowly lift your head and release your tail bone so that your back sags into a large arch, like an old horse.  Hold this position for __________ seconds.  Repeat this until you feel limber in your low back.  Now, find your "sweet spot." This will be the most comfortable position  somewhere between the two previous positions. This is your neutral spine. Once you have found this position, tense your stomach muscles to support your low back.  Hold this position for __________ seconds. Repeat __________ times. Complete this exercise __________ times per day.  STRENGTHENING EXERCISES - Low Back Sprain These exercises may help you when beginning to rehabilitate your injury. These exercises should be done near your "sweet spot." This is the neutral, low-back arch, somewhere between  fully rounded and fully arched, that is your least painful position. When performed in this safe range of motion, these exercises can be used for people who have either a flexion or extension based injury. These exercises may resolve your symptoms with or without further involvement from your physician, physical therapist or athletic trainer. While completing these exercises, remember:   Muscles can gain both the endurance and the strength needed for everyday activities through controlled exercises.  Complete these exercises as instructed by your physician, physical therapist or athletic trainer. Increase the resistance and repetitions only as guided.  You may experience muscle soreness or fatigue, but the pain or discomfort you are trying to eliminate should never worsen during these exercises. If this pain does worsen, stop and make certain you are following the directions exactly. If the pain is still present after adjustments, discontinue the exercise until you can discuss the trouble with your caregiver. STRENGTHENING - Deep Abdominals, Pelvic Tilt   Lie on a firm bed or floor. Keeping your legs in front of you, bend your knees so they are both pointed toward the ceiling and your feet are flat on the floor.  Tense your lower abdominal muscles to press your low back into the floor. This motion will rotate your pelvis so that your tail bone is scooping upwards rather than pointing at your feet or into  the floor. With a gentle tension and even breathing, hold this position for __________ seconds. Repeat __________ times. Complete this exercise __________ times per day.  STRENGTHENING - Abdominals, Crunches   Lie on a firm bed or floor. Keeping your legs in front of you, bend your knees so they are both pointed toward the ceiling and your feet are flat on the floor. Cross your arms over your chest.  Slightly tip your chin down without bending your neck.  Tense your abdominals and slowly lift your trunk high enough to just clear your shoulder blades. Lifting higher can put excessive stress on the lower back and does not further strengthen your abdominal muscles.  Control your return to the starting position. Repeat __________ times. Complete this exercise __________ times per day.  STRENGTHENING - Quadruped, Opposite UE/LE Lift   Assume a hands and knees position on a firm surface. Keep your hands under your shoulders and your knees under your hips. You may place padding under your knees for comfort.  Find your neutral spine and gently tense your abdominal muscles so that you can maintain this position. Your shoulders and hips should form a rectangle that is parallel with the floor and is not twisted.  Keeping your trunk steady, lift your right hand no higher than your shoulder and then your left leg no higher than your hip. Make sure you are not holding your breath. Hold this position for __________ seconds.  Continuing to keep your abdominal muscles tense and your back steady, slowly return to your starting position. Repeat with the opposite arm and leg. Repeat __________ times. Complete this exercise __________ times per day.  STRENGTHENING - Abdominals and Quadriceps, Straight Leg Raise   Lie on a firm bed or floor with both legs extended in front of you.  Keeping one leg in contact with the floor, bend the other knee so that your foot can rest flat on the floor.  Find your neutral  spine, and tense your abdominal muscles to maintain your spinal position throughout the exercise.  Slowly lift your straight leg off the floor about 6 inches for  a count of 15, making sure to not hold your breath.  Still keeping your neutral spine, slowly lower your leg all the way to the floor. Repeat this exercise with each leg __________ times. Complete this exercise __________ times per day. POSTURE AND BODY MECHANICS CONSIDERATIONS - Low Back Sprain Keeping correct posture when sitting, standing or completing your activities will reduce the stress put on different body tissues, allowing injured tissues a chance to heal and limiting painful experiences. The following are general guidelines for improved posture. Your physician or physical therapist will provide you with any instructions specific to your needs. While reading these guidelines, remember:  The exercises prescribed by your provider will help you have the flexibility and strength to maintain correct postures.  The correct posture provides the best environment for your joints to work. All of your joints have less wear and tear when properly supported by a spine with good posture. This means you will experience a healthier, less painful body.  Correct posture must be practiced with all of your activities, especially prolonged sitting and standing. Correct posture is as important when doing repetitive low-stress activities (typing) as it is when doing a single heavy-load activity (lifting). RESTING POSITIONS Consider which positions are most painful for you when choosing a resting position. If you have pain with flexion-based activities (sitting, bending, stooping, squatting), choose a position that allows you to rest in a less flexed posture. You would want to avoid curling into a fetal position on your side. If your pain worsens with extension-based activities (prolonged standing, working overhead), avoid resting in an extended position  such as sleeping on your stomach. Most people will find more comfort when they rest with their spine in a more neutral position, neither too rounded nor too arched. Lying on a non-sagging bed on your side with a pillow between your knees, or on your back with a pillow under your knees will often provide some relief. Keep in mind, being in any one position for a prolonged period of time, no matter how correct your posture, can still lead to stiffness. PROPER SITTING POSTURE In order to minimize stress and discomfort on your spine, you must sit with correct posture. Sitting with good posture should be effortless for a healthy body. Returning to good posture is a gradual process. Many people can work toward this most comfortably by using various supports until they have the flexibility and strength to maintain this posture on their own. When sitting with proper posture, your ears will fall over your shoulders and your shoulders will fall over your hips. You should use the back of the chair to support your upper back. Your lower back will be in a neutral position, just slightly arched. You may place a small pillow or folded towel at the base of your lower back for  support.  When working at a desk, create an environment that supports good, upright posture. Without extra support, muscles tire, which leads to excessive strain on joints and other tissues. Keep these recommendations in mind: CHAIR:  A chair should be able to slide under your desk when your back makes contact with the back of the chair. This allows you to work closely.  The chair's height should allow your eyes to be level with the upper part of your monitor and your hands to be slightly lower than your elbows. BODY POSITION  Your feet should make contact with the floor. If this is not possible, use a foot rest.  Keep  your ears over your shoulders. This will reduce stress on your neck and low back. INCORRECT SITTING POSTURES  If you are  feeling tired and unable to assume a healthy sitting posture, do not slouch or slump. This puts excessive strain on your back tissues, causing more damage and pain. Healthier options include:  Using more support, like a lumbar pillow.  Switching tasks to something that requires you to be upright or walking.  Talking a brief walk.  Lying down to rest in a neutral-spine position. PROLONGED STANDING WHILE SLIGHTLY LEANING FORWARD  When completing a task that requires you to lean forward while standing in one place for a long time, place either foot up on a stationary 2-4 inch high object to help maintain the best posture. When both feet are on the ground, the lower back tends to lose its slight inward curve. If this curve flattens (or becomes too large), then the back and your other joints will experience too much stress, tire more quickly, and can cause pain. CORRECT STANDING POSTURES Proper standing posture should be assumed with all daily activities, even if they only take a few moments, like when brushing your teeth. As in sitting, your ears should fall over your shoulders and your shoulders should fall over your hips. You should keep a slight tension in your abdominal muscles to brace your spine. Your tailbone should point down to the ground, not behind your body, resulting in an over-extended swayback posture.  INCORRECT STANDING POSTURES  Common incorrect standing postures include a forward head, locked knees and/or an excessive swayback. WALKING Walk with an upright posture. Your ears, shoulders and hips should all line-up. PROLONGED ACTIVITY IN A FLEXED POSITION When completing a task that requires you to bend forward at your waist or lean over a low surface, try to find a way to stabilize 3 out of 4 of your limbs. You can place a hand or elbow on your thigh or rest a knee on the surface you are reaching across. This will provide you more stability, so that your muscles do not tire as  quickly. By keeping your knees relaxed, or slightly bent, you will also reduce stress across your lower back. CORRECT LIFTING TECHNIQUES DO :  Assume a wide stance. This will provide you more stability and the opportunity to get as close as possible to the object which you are lifting.  Tense your abdominals to brace your spine. Bend at the knees and hips. Keeping your back locked in a neutral-spine position, lift using your leg muscles. Lift with your legs, keeping your back straight.  Test the weight of unknown objects before attempting to lift them.  Try to keep your elbows locked down at your sides in order get the best strength from your shoulders when carrying an object.  Always ask for help when lifting heavy or awkward objects. INCORRECT LIFTING TECHNIQUES DO NOT:   Lock your knees when lifting, even if it is a small object.  Bend and twist. Pivot at your feet or move your feet when needing to change directions.  Assume that you can safely pick up even a paperclip without proper posture.   This information is not intended to replace advice given to you by your health care provider. Make sure you discuss any questions you have with your health care provider.   Document Released: 02/21/2005 Document Revised: 03/14/2014 Document Reviewed: 06/05/2008 Elsevier Interactive Patient Education [employer]

## 2015-07-28 NOTE — ED Notes (Signed)
Pt is in stable condition upon d/c and ambulates from ED. 

## 2015-07-28 NOTE — ED Notes (Signed)
Pt arrives with c/o mid back pain. States she thinks she pulled a muscle at work last night. States it hurts worse when she lays flat and states she can't turn to the left. Pt states she has tried ibuprofen and vicks vapor rub at home with no cessation of pain.

## 2015-07-28 NOTE — ED Provider Notes (Signed)
CSN: [id number]     Arrival date & time 07/28/15  [id number] History  By signing my name below, I, [name], attest that this documentation has been prepared under the direction and in the presence of Danelle BerryLeisa Zamere Pasternak, PA-C. Electronically Signed: [name], ED Scribe. 07/28/2015. 9:27 AM.    Chief Complaint  Patient presents with  . Back Pain   The history is provided by the patient. No language interpreter was used.    HPI Comments: Lisa Charles is a 30 y.o. female who presents to the Emergency Department complaining of constant, non-radiating, moderate, left lower back pain that began yesterday after bending down to pick something up and standing back up at work last night. She states she feels like she pulled a muscle. Lying flat on her back and twisting her back to the left exacerbates her pain. She states she has tried ibuprofen and Vicks' Vapo Rub with only partial relief. She states she last used ibuprofen yesterday. She denies a history of prior back injuries. She also denies a history of chronic medical conditions. She denies steroid use, IVDU, cancer, bowel or bladder incontinence, saddle anesthesia, extrremity numbness or weakness, dysuria, hematuria, abdominal pain, nausea, vomiting, or fever. Patient has NKDA.    Past Medical History  Diagnosis Date  . No pertinent past medical history   . Bronchitis   . Hypertension     pih  . Pre-eclampsia    Past Surgical History  Procedure Laterality Date  . No past surgeries     Family History  Problem Relation Age of Onset  . Diabetes Mother   . Hypertension Mother   . Hyperlipidemia Mother   . Gout Father    Social History  Substance Use Topics  . Smoking status: Current Some Day Smoker  . Smokeless tobacco: Never Used  . Alcohol Use: Yes     Comment: occ   OB History    Gravida Para Term Preterm AB TAB SAB Ectopic Multiple Living   1 1 1  0 0 0 0 0 0 1     Review of Systems  Constitutional: Negative for fever.   Gastrointestinal: Negative for nausea, vomiting and abdominal pain.       Negative for bowel incontinence.    Genitourinary: Negative for dysuria and hematuria.       Negative for bladder incontinence.    Musculoskeletal: Positive for back pain.  Neurological: Negative for weakness and numbness.  All other systems reviewed and are negative.  Allergies  Review of patient's allergies indicates no known allergies.  Home Medications   Prior to Admission medications   Medication Sig Start Date End Date Taking? Authorizing Provider  albuterol (PROVENTIL HFA;VENTOLIN HFA) 108 (90 BASE) MCG/ACT inhaler Inhale 2 puffs into the lungs every 6 (six) hours as needed for wheezing or shortness of breath.     Historical Provider, MD  cholecalciferol (VITAMIN D) 1000 UNITS tablet Take 1,000 Units by mouth daily.    Historical Provider, MD  cyclobenzaprine (FLEXERIL) 10 MG tablet Take 1 tablet (10 mg total) by mouth 2 (two) times daily as needed for muscle spasms. 07/28/15   Danelle BerryLeisa Katerra Ingman, PA-C  ibuprofen (ADVIL,MOTRIN) 800 MG tablet Take 1 tablet (800 mg total) by mouth 3 (three) times daily. 07/28/15   Danelle BerryLeisa Jermar Colter, PA-C  ketorolac (TORADOL) 10 MG tablet Take 1 tablet (10 mg total) by mouth every 6 (six) hours as needed. 07/28/15   Danelle BerryLeisa Ellah Otte, PA-C  ondansetron (ZOFRAN) 4 MG tablet Take 1 tablet (  4 mg total) by mouth every 6 (six) hours. 08/22/14   Dione Booze, MD  ondansetron (ZOFRAN-ODT) 8 MG disintegrating tablet Take 1 tablet (8 mg total) by mouth every 8 (eight) hours as needed for nausea or vomiting. 08/21/14   Benjiman Core, MD   BP 117/67 mmHg  Pulse 67  Temp(Src) 98.3 F (36.8 C) (Oral)  Resp 18 Physical Exam  Constitutional: She is oriented to person, place, and time. She appears well-developed and well-nourished. No distress.  HENT:  Head: Normocephalic and atraumatic.  Nose: Nose normal.  Mouth/Throat: Oropharynx is clear and moist. No oropharyngeal exudate.  Eyes: Conjunctivae and EOM  are normal. Pupils are equal, round, and reactive to light. Right eye exhibits no discharge. Left eye exhibits no discharge. No scleral icterus.  Neck: Normal range of motion. Neck supple. No JVD present. No tracheal deviation present. No thyromegaly present.  Cardiovascular: Normal rate, regular rhythm, normal heart sounds and intact distal pulses.  Exam reveals no gallop and no friction rub.   No murmur heard. Pulmonary/Chest: Effort normal and breath sounds normal. No respiratory distress. She has no wheezes. She has no rales. She exhibits no tenderness.  Abdominal: Soft. Bowel sounds are normal. She exhibits no distension. There is no tenderness.  No CVA tenderness  Musculoskeletal: Normal range of motion. She exhibits no edema or tenderness.  No midline bony or paraspinal tenderness to CTL spine.  Lymphadenopathy:    She has no cervical adenopathy.  Neurological: She is alert and oriented to person, place, and time. She has normal reflexes. No cranial nerve deficit. She exhibits normal muscle tone. Coordination normal.  CN II- XII grossly intact Normal strength in lower extremities bilaterally including dorsiflexion and plantar flexion Sensation normal to light and sharp touch Moves extremities without ataxia, coordination intact Antalgic gait.      Skin: Skin is warm and dry. No rash noted. She is not diaphoretic. No erythema. No pallor.  Psychiatric: She has a normal mood and affect. Her behavior is normal. Judgment and thought content normal.  Nursing note and vitals reviewed.   ED Course  Procedures (including critical care time)  COORDINATION OF CARE: 9:20 AM - Discussed treatment plan with pt at bedside which includes muscle relaxants and pain medications. Advised heat, warm showers, and light lifting. Pt verbalized understanding and agreed to plan.   MDM   Final diagnoses:  Left-sided low back pain without sciatica   Patient with back pain.  No neurological deficits  and normal neuro exam.  Patient can walk but states is painful.  No loss of bowel or bladder control.  No concern for cauda equina.  No fever, night sweats, weight loss, h/o cancer, IVDU.  RICE protocol and pain medicine indicated and discussed with patient.    I personally performed the services described in this documentation, which was scribed in my presence. The recorded information has been reviewed and is accurate.       Danelle Berry, PA-C 07/28/15 1011  Arby Barrette, MD 07/30/15 [phone]

## 2015-08-02 ENCOUNTER — Emergency Department (HOSPITAL_COMMUNITY)
Admission: EM | Admit: 2015-08-02 | Discharge: 2015-08-02 | Disposition: A | Discharge status: Home / Self Care | Payer: MEDICAID | Attending: Emergency Medicine | Admitting: Emergency Medicine

## 2015-08-02 ENCOUNTER — Encounter (HOSPITAL_COMMUNITY): Payer: Self-pay

## 2015-08-02 DIAGNOSIS — Z791 Long term (current) use of non-steroidal anti-inflammatories (NSAID): Secondary | ICD-10-CM | POA: Insufficient documentation

## 2015-08-02 DIAGNOSIS — Z79899 Other long term (current) drug therapy: Secondary | ICD-10-CM | POA: Insufficient documentation

## 2015-08-02 DIAGNOSIS — M545 Low back pain, unspecified: Secondary | ICD-10-CM

## 2015-08-02 DIAGNOSIS — F17200 Nicotine dependence, unspecified, uncomplicated: Secondary | ICD-10-CM | POA: Insufficient documentation

## 2015-08-02 DIAGNOSIS — I10 Essential (primary) hypertension: Secondary | ICD-10-CM | POA: Insufficient documentation
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
## 2015-08-02 NOTE — Discharge Instructions (Signed)

## 2015-08-02 NOTE — ED Notes (Signed)
Declined W/C at D/C and was escorted to lobby by RN. 
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
## 2015-10-03 ENCOUNTER — Emergency Department (HOSPITAL_COMMUNITY)
Admission: EM | Admit: 2015-10-03 | Discharge: 2015-10-03 | Disposition: A | Discharge status: Home / Self Care | Payer: Medicaid Other | Attending: Emergency Medicine | Admitting: Emergency Medicine

## 2015-10-03 ENCOUNTER — Encounter (HOSPITAL_COMMUNITY): Payer: Self-pay

## 2015-10-03 DIAGNOSIS — I10 Essential (primary) hypertension: Secondary | ICD-10-CM | POA: Insufficient documentation

## 2015-10-03 DIAGNOSIS — F17200 Nicotine dependence, unspecified, uncomplicated: Secondary | ICD-10-CM | POA: Insufficient documentation

## 2015-10-03 DIAGNOSIS — L0291 Cutaneous abscess, unspecified: Secondary | ICD-10-CM

## 2015-10-03 DIAGNOSIS — L02412 Cutaneous abscess of left axilla: Secondary | ICD-10-CM | POA: Insufficient documentation

## 2015-10-03 MED ORDER — SULFAMETHOXAZOLE-TRIMETHOPRIM 800-160 MG PO TABS: 1.0000 | ORAL_TABLET | Freq: Two times a day (BID) | ORAL | 0 refills | Status: AC

## 2015-10-03 MED ORDER — LIDOCAINE-EPINEPHRINE 1 %-1:100000 IJ SOLN
20.0000 mL | Freq: Once | INTRAMUSCULAR | Status: AC
  Administered 2015-10-03: 20 mL via INTRADERMAL
  Filled 2015-10-03: qty 1

## 2015-10-03 NOTE — ED Triage Notes (Signed)
Patient here with abscess x 2 to left axilla, states one has drained and the other is painful, NAD.

## 2015-10-03 NOTE — ED Provider Notes (Signed)
[facility]-EMERGENCY DEPT Provider Note   CSN: [id number] Arrival date & time: 10/03/15  1637  First Provider Contact:  None     No risk factors  History   Chief Complaint Chief Complaint  Patient presents with  . Abscess    HPI Lisa Charles is a 30 y.o. female.   The history is provided by the patient.  No language interpreter was used.  Abscess  Location:  Shoulder/arm Shoulder/arm abscess location:  L axilla Size:  4cm Abscess quality: fluctuance, painful and warmth (mild)   Abscess quality: not draining and no redness   Red streaking: no   Duration:  1 week Progression:  Worsening Pain details:    Quality:  Sharp and tightness   Severity:  Moderate (to severe)   Duration:  1 week   Timing:  Constant   Progression:  Waxing and waning Chronicity:  Recurrent Context: not diabetes, not immunosuppression, not injected drug use and not skin injury   Relieved by: patient squeezed the more proximal one, unable to do so for the larger one she was complaining of.  Exacerbated by: palpation. Ineffective treatments:  None tried Associated symptoms: no anorexia, no fever, no nausea and no vomiting   Risk factors: prior abscess  MRSA: unknown.     Past Medical History:  Diagnosis Date  . Bronchitis   . Hypertension    pih  . No pertinent past medical history   . Pre-eclampsia     Patient Active Problem List   Diagnosis Date Noted  . Preeclampsia 02/20/2012  . Active labor 02/20/2012  . Normal delivery 02/20/2012    Past Surgical History:  Procedure Laterality Date  . NO PAST SURGERIES      OB History    Gravida Para Term Preterm AB Living   1 1 1  0 0 1   SAB TAB Ectopic Multiple Live Births   0 0 0 0         Home Medications    Prior to Admission medications   Medication Sig Start Date End Date Taking? Authorizing Provider  albuterol (PROVENTIL HFA;VENTOLIN HFA) 108 (90 BASE) MCG/ACT inhaler Inhale 2 puffs into the lungs every 6 (six) hours as needed  for wheezing or shortness of breath.     Historical Provider, MD  cholecalciferol (VITAMIN D) 1000 UNITS tablet Take 1,000 Units by mouth daily.    Historical Provider, MD  cyclobenzaprine (FLEXERIL) 10 MG tablet Take 1 tablet (10 mg total) by mouth 2 (two) times daily as needed for muscle spasms. 07/28/15   Danelle Berry, PA-C  ibuprofen (ADVIL,MOTRIN) 800 MG tablet Take 1 tablet (800 mg total) by mouth 3 (three) times daily. 07/28/15   Danelle Berry, PA-C  ketorolac (TORADOL) 10 MG tablet Take 1 tablet (10 mg total) by mouth every 6 (six) hours as needed. 07/28/15   Danelle Berry, PA-C  ondansetron (ZOFRAN) 4 MG tablet Take 1 tablet (4 mg total) by mouth every 6 (six) hours. 08/22/14   Dione Booze, MD  ondansetron (ZOFRAN-ODT) 8 MG disintegrating tablet Take 1 tablet (8 mg total) by mouth every 8 (eight) hours as needed for nausea or vomiting. 08/21/14   Benjiman Core, MD  sulfamethoxazole-trimethoprim (BACTRIM DS,SEPTRA DS) 800-160 MG tablet Take 1 tablet by mouth 2 (two) times daily. 10/03/15 10/10/15  Maretta Bees, MD    Family History Family History  Problem Relation Age of Onset  . Diabetes Mother   . Hypertension Mother   . Hyperlipidemia Mother   . Gout  Father     Social History Social History  Substance Use Topics  . Smoking status: Current Some Day Smoker  . Smokeless tobacco: Never Used  . Alcohol use Yes     Comment: occ     Allergies   Review of patient's allergies indicates no known allergies.   Review of Systems Review of Systems  Constitutional: Negative for fever.  Gastrointestinal: Negative for anorexia, nausea and vomiting.  All other systems reviewed and are negative.    Physical Exam Updated Vital Signs BP 122/63 (BP Location: Right Arm)   Pulse 71   Temp 98 F (36.7 C) (Oral)   Resp 16   Ht 5\' 9"  (1.753 m)   Wt (!) 139.7 kg   SpO2 100%   BMI 45.48 kg/m    Physical Exam  Constitutional: She is oriented to person, place, and time. No distress.  obese   HENT:  Head: Normocephalic and atraumatic.  Eyes: Conjunctivae are normal. Right eye exhibits no discharge. Left eye exhibits no discharge.  Neck: Normal range of motion. Neck supple.  Cardiovascular: Normal rate, regular rhythm and intact distal pulses.   Pulmonary/Chest: Effort normal. No respiratory distress.  Abdominal: Soft. She exhibits no distension. There is no tenderness.  Musculoskeletal: Normal range of motion. She exhibits no edema, tenderness or deformity.  Neurological: She is alert and oriented to person, place, and time.  Skin: Skin is warm and dry. Capillary refill takes less than 2 seconds. No rash noted. She is not diaphoretic. No erythema.  4 cm abscess along L axilla, a few cm distal to a prior abscess that was draining purulent material that patient had drained earlier in the week. Smaller abscess was more indurated. No redness noted.  Psychiatric: She has a normal mood and affect. Her behavior is normal.  Nursing note and vitals reviewed.    ED Treatments / Results  Labs (all labs ordered are listed, but only abnormal results are displayed) Labs Reviewed - No data to display  EKG  EKG Interpretation None       Radiology No results found.  Procedures .Marland KitchenIncision and Drainage  Date/Time: 10/03/2015 9:30 PM  Performed by: Maretta Bees  Authorized by: Jacalyn Lefevre   Consent:    Consent obtained:  Verbal   Consent given by:  Patient   Risks discussed:  Bleeding, incomplete drainage, pain and infection   Alternatives discussed:  No treatment Location:    Type:  Abscess   Size:  4 cm x 3cm   Location:  Upper extremity   Upper extremity location: L axilla, distal. Pre-procedure details:    Skin preparation:  Betadine Anesthesia (see MAR for exact dosages):    Anesthesia method:  Local infiltration   Local anesthetic:  Lidocaine 1% WITH epi Procedure type:    Complexity:  Simple Procedure details:    Needle aspiration: no     Incision types:   Single straight   Incision depth:  Dermal   Scalpel blade:  11   Wound management:  Probed and deloculated   Drainage:  Bloody and purulent   Drainage amount: scant to mod.   Wound treatment:  Wound left open   Packing materials:  None Post-procedure details:    Patient tolerance of procedure:  Tolerated well, no immediate complications  .Marland KitchenIncision and Drainage  Date/Time: 10/03/2015 9:30 PM  Performed by: Maretta Bees  Authorized by: Jacalyn Lefevre   Consent:    Consent obtained:  Verbal   Consent given by:  Patient  Risks discussed:  Bleeding, incomplete drainage, pain and infection   Alternatives discussed:  No treatment Location:    Type:  Abscess   Size:  4 cm x 1cm   Location:  Upper extremity   Upper extremity location: proximal L axilla. Pre-procedure details:    Skin preparation:  Alcohol prep Anesthesia (see MAR for exact dosages):    Anesthesia method:  Local infiltration   Local anesthetic:  Lidocaine 1% WITH epi Procedure type:    Complexity:  Simple Procedure details:    Needle aspiration: no     Incision types:  Single straight   Incision depth:  Dermal   Scalpel blade:  11   Wound management:  Probed and deloculated   Drainage:  Purulent   Drainage amount: scant    Wound treatment:  Wound left open   Packing materials:  None Post-procedure details:    Patient tolerance of procedure:  Tolerated well, no immediate complications   Medications Ordered in ED Medications  lidocaine-EPINEPHrine (XYLOCAINE W/EPI) 1 %-1:100000 (with pres) injection 20 mL (20 mLs Intradermal Given 10/03/15 1921)     Initial Impression / Assessment and Plan / ED Course  I have reviewed the triage vital signs and the nursing notes.  Pertinent labs & imaging results that were available during my care of the patient were reviewed by me and considered in my medical decision making (see chart for details).  Clinical Course   Patient presents with collection x2 in her L  axilla, concerning for abscess. Has history of them. Visible purulent drainage from proximal one. Fluctuant, making it unlikely to be lymphadenopathy. Also time course is not consistent with other masses such as tumors. Bedside US was performed and confirmed collection of fluid in distal location amendable to drainage, as documented above. Proximal abscess seemed to have been significantly drained PTA. Opening of site of drainage was performed but material expressed was significantly more solid than larger abscess, indicating this collection was resolving and would not be amendable to further drainage. Discussed usual post I and D care and encourage patient to get wound re-evaluated in a week with new PCP information given in AVS. Rx'ed Bactrim for MRSA PPX in light of size of abscess. No other significant RF noted on HPI.   At discharge, all questions answered and VSS. Usual and customary post I and D instructions given. Patient stated understanding and agreement.   Final Clinical Impressions(s) / ED Diagnoses   Final diagnoses:  Abscess    New Prescriptions Discharge Medication List as of 10/03/2015  9:02 PM    START taking these medications   Details  sulfamethoxazole-trimethoprim (BACTRIM DS,SEPTRA DS) 800-160 MG tablet Take 1 tablet by mouth 2 (two) times daily., Starting Sat 10/03/2015, Until Sat 10/10/2015, Print        Maretta Bees, MD 10/04/15 1914  Jacalyn Lefevre, MD 10/04/15 2125

## 2015-10-03 NOTE — ED Notes (Signed)
Vitals obtained at this time.  Dressing applied to left underarm wound.  Getting dressed at this time.  To discharge when physician is available.

## 2016-01-12 ENCOUNTER — Emergency Department (HOSPITAL_COMMUNITY)
Admission: EM | Admit: 2016-01-12 | Discharge: 2016-01-12 | Disposition: A | Discharge status: Home / Self Care | Payer: Self-pay | Attending: Emergency Medicine | Admitting: Emergency Medicine

## 2016-01-12 ENCOUNTER — Emergency Department (HOSPITAL_COMMUNITY): Payer: Self-pay

## 2016-01-12 ENCOUNTER — Encounter (HOSPITAL_COMMUNITY): Payer: Self-pay | Admitting: Emergency Medicine

## 2016-01-12 DIAGNOSIS — Z79899 Other long term (current) drug therapy: Secondary | ICD-10-CM | POA: Insufficient documentation

## 2016-01-12 DIAGNOSIS — J069 Acute upper respiratory infection, unspecified: Secondary | ICD-10-CM | POA: Insufficient documentation

## 2016-01-12 DIAGNOSIS — I10 Essential (primary) hypertension: Secondary | ICD-10-CM | POA: Insufficient documentation

## 2016-01-12 DIAGNOSIS — F17200 Nicotine dependence, unspecified, uncomplicated: Secondary | ICD-10-CM | POA: Insufficient documentation

## 2016-01-12 DIAGNOSIS — R1011 Right upper quadrant pain: Secondary | ICD-10-CM | POA: Insufficient documentation

## 2016-01-12 DIAGNOSIS — R112 Nausea with vomiting, unspecified: Secondary | ICD-10-CM | POA: Insufficient documentation

## 2016-01-12 LAB — CBC WITH DIFFERENTIAL/PLATELET
BASOS ABS: 0 10*3/uL (ref 0.0–0.1)
Basophils Relative: 0 %
Eosinophils Absolute: 0.1 10*3/uL (ref 0.0–0.7)
Eosinophils Relative: 2 %
HCT: 39.5 % (ref 36.0–46.0)
HEMOGLOBIN: 12.7 g/dL (ref 12.0–15.0)
LYMPHS ABS: 1.2 10*3/uL (ref 0.7–4.0)
LYMPHS PCT: 19 %
MCH: 26.1 pg (ref 26.0–34.0)
MCHC: 32.2 g/dL (ref 30.0–36.0)
MCV: 81.3 fL (ref 78.0–100.0)
Monocytes Absolute: 0.5 10*3/uL (ref 0.1–1.0)
Monocytes Relative: 8 %
NEUTROS ABS: 4.7 10*3/uL (ref 1.7–7.7)
NEUTROS PCT: 71 %
Platelets: 319 10*3/uL (ref 150–400)
RBC: 4.86 MIL/uL (ref 3.87–5.11)
RDW: 15 % (ref 11.5–15.5)
WBC: 6.6 10*3/uL (ref 4.0–10.5)

## 2016-01-12 LAB — I-STAT TROPONIN, ED
TROPONIN I, POC: 0 ng/mL (ref 0.00–0.08)
Troponin i, poc: 0 ng/mL (ref 0.00–0.08)

## 2016-01-12 LAB — I-STAT CHEM 8, ED
BUN: 9 mg/dL (ref 6–20)
CHLORIDE: 101 mmol/L (ref 101–111)
CREATININE: 0.8 mg/dL (ref 0.44–1.00)
Calcium, Ion: 1.17 mmol/L (ref 1.15–1.40)
Glucose, Bld: 87 mg/dL (ref 65–99)
HEMATOCRIT: 43 % (ref 36.0–46.0)
Hemoglobin: 14.6 g/dL (ref 12.0–15.0)
POTASSIUM: 3.4 mmol/L — AB (ref 3.5–5.1)
Sodium: 141 mmol/L (ref 135–145)
TCO2: 26 mmol/L (ref 0–100)

## 2016-01-12 LAB — COMPREHENSIVE METABOLIC PANEL
ALK PHOS: 73 U/L (ref 38–126)
ALT: 19 U/L (ref 14–54)
AST: 25 U/L (ref 15–41)
Albumin: 4.6 g/dL (ref 3.5–5.0)
Anion gap: 4 — ABNORMAL LOW (ref 5–15)
BUN: 10 mg/dL (ref 6–20)
CALCIUM: 9.5 mg/dL (ref 8.9–10.3)
CHLORIDE: 105 mmol/L (ref 101–111)
CO2: 27 mmol/L (ref 22–32)
CREATININE: 0.84 mg/dL (ref 0.44–1.00)
Glucose, Bld: 85 mg/dL (ref 65–99)
Potassium: 3.4 mmol/L — ABNORMAL LOW (ref 3.5–5.1)
Sodium: 136 mmol/L (ref 135–145)
Total Bilirubin: 1 mg/dL (ref 0.3–1.2)
Total Protein: 8.9 g/dL — ABNORMAL HIGH (ref 6.5–8.1)

## 2016-01-12 LAB — LIPASE, BLOOD: LIPASE: 28 U/L (ref 11–51)

## 2016-01-12 LAB — I-STAT BETA HCG BLOOD, ED (MC, WL, AP ONLY): I-stat hCG, quantitative: <5 m[IU]/mL (ref <5)

## 2016-01-12 MED ORDER — METHYLPREDNISOLONE SODIUM SUCC 125 MG IJ SOLR
125.0000 mg | Freq: Once | INTRAMUSCULAR | Status: AC
  Administered 2016-01-12: 125 mg via INTRAVENOUS
  Filled 2016-01-12: qty 2

## 2016-01-12 MED ORDER — SODIUM CHLORIDE 0.9 % IV BOLUS (SEPSIS)
1000.0000 mL | Freq: Once | INTRAVENOUS | Status: AC
  Administered 2016-01-12: 1000 mL via INTRAVENOUS

## 2016-01-12 MED ORDER — ALBUTEROL SULFATE HFA 108 (90 BASE) MCG/ACT IN AERS: 1.0000 | INHALATION_SPRAY | Freq: Four times a day (QID) | RESPIRATORY_TRACT | 0 refills | Status: DC | PRN

## 2016-01-12 MED ORDER — RANITIDINE HCL 150 MG PO TABS: 150.0000 mg | ORAL_TABLET | Freq: Two times a day (BID) | ORAL | 0 refills | Status: DC

## 2016-01-12 MED ORDER — DOXYCYCLINE HYCLATE 100 MG PO CAPS: 100.0000 mg | ORAL_CAPSULE | Freq: Two times a day (BID) | ORAL | 0 refills | Status: AC

## 2016-01-12 MED ORDER — PREDNISONE 20 MG PO TABS: 40.0000 mg | ORAL_TABLET | Freq: Every day | ORAL | 0 refills | Status: AC

## 2016-01-12 MED ORDER — FAMOTIDINE IN NACL 20-0.9 MG/50ML-% IV SOLN
20.0000 mg | Freq: Once | INTRAVENOUS | Status: AC
  Administered 2016-01-12: 20 mg via INTRAVENOUS
  Filled 2016-01-12: qty 50

## 2016-01-12 MED ORDER — DIPHENHYDRAMINE HCL 50 MG/ML IJ SOLN
50.0000 mg | Freq: Once | INTRAMUSCULAR | Status: AC
  Administered 2016-01-12: 50 mg via INTRAVENOUS
  Filled 2016-01-12: qty 1

## 2016-01-12 MED ORDER — ONDANSETRON HCL 4 MG/2ML IJ SOLN
4.0000 mg | Freq: Once | INTRAMUSCULAR | Status: AC
  Administered 2016-01-12: 4 mg via INTRAVENOUS
  Filled 2016-01-12: qty 2

## 2016-01-12 MED ORDER — PROMETHAZINE HCL 25 MG RE SUPP: 25.0000 mg | Freq: Three times a day (TID) | RECTAL | 0 refills | Status: DC | PRN

## 2016-01-12 MED ORDER — ALBUTEROL SULFATE (2.5 MG/3ML) 0.083% IN NEBU
5.0000 mg | INHALATION_SOLUTION | Freq: Once | RESPIRATORY_TRACT | Status: AC
  Administered 2016-01-12: 5 mg via RESPIRATORY_TRACT
  Filled 2016-01-12: qty 6

## 2016-01-12 MED ORDER — ONDANSETRON HCL 4 MG PO TABS: 4.0000 mg | ORAL_TABLET | Freq: Three times a day (TID) | ORAL | 0 refills | Status: DC | PRN

## 2016-01-12 MED ORDER — GI COCKTAIL ~~LOC~~
30.0000 mL | Freq: Once | ORAL | Status: AC
  Administered 2016-01-12: 30 mL via ORAL
  Filled 2016-01-12: qty 30

## 2016-01-12 NOTE — ED Notes (Signed)
Patient is A & O x4.  She understood discharge instructions. 

## 2016-01-12 NOTE — ED Provider Notes (Signed)
[facility]-EMERGENCY DEPT Provider Note   CSN: [id number] Arrival date & time: 01/12/16  4098     History   Chief Complaint Chief Complaint  Patient presents with  . Shortness of Breath  . Nasal Congestion    HPI Lisa Charles is a 30 y.o. female.  HPI 30 year old female with past medical history of mild asthma who presents with shortness of breath and chest pain. The patient states that her symptoms were yesterday with moderate nasal congestion. She woke up this morning and had worsening of congestion as well as mild "chest congestion." She took a Mucinex DM liquid that she had not had before. After taking it, she reports worsening of her chest pain now nurse's a dull, aching, substernal chest pressure that began immediately after taking her medication. She also notes increasing shortness of breath although she also notes increased anxiety. Denies any fevers or chills. She does have been sick contacts. As mentioned, she has never taken this medication before. Denies any alleviating or aggravating factors. No personal or family history of coronary disease.  Past Medical History:  Diagnosis Date  . Bronchitis   . Hypertension    pih  . No pertinent past medical history   . Pre-eclampsia     Patient Active Problem List   Diagnosis Date Noted  . Preeclampsia 02/20/2012  . Active labor 02/20/2012  . Normal delivery 02/20/2012    Past Surgical History:  Procedure Laterality Date  . NO PAST SURGERIES      OB History    Gravida Para Term Preterm AB Living   1 1 1  0 0 1   SAB TAB Ectopic Multiple Live Births   0 0 0 0 1       Home Medications    Prior to Admission medications   Medication Sig Start Date End Date Taking? Authorizing Provider  Chlorphen-PE-Acetaminophen (SINUS CONGESTION/PAIN DAY/NGHT PO) Take 2 tablets by mouth every 6 (six) hours as needed (sinus/pain).   Yes Historical Provider, MD  albuterol (PROVENTIL HFA;VENTOLIN HFA) 108 (90 Base) MCG/ACT inhaler  Inhale 1-2 puffs into the lungs every 6 (six) hours as needed for wheezing or shortness of breath. 01/12/16   Shaune Pollack, MD  cyclobenzaprine (FLEXERIL) 10 MG tablet Take 1 tablet (10 mg total) by mouth 2 (two) times daily as needed for muscle spasms. Patient not taking: Reported on 01/12/2016 07/28/15   Danelle Berry, PA-C  doxycycline (VIBRAMYCIN) 100 MG capsule Take 1 capsule (100 mg total) by mouth 2 (two) times daily. 01/12/16 01/19/16  Shaune Pollack, MD  ibuprofen (ADVIL,MOTRIN) 800 MG tablet Take 1 tablet (800 mg total) by mouth 3 (three) times daily. Patient not taking: Reported on 01/12/2016 07/28/15   Danelle Berry, PA-C  ketorolac (TORADOL) 10 MG tablet Take 1 tablet (10 mg total) by mouth every 6 (six) hours as needed. Patient not taking: Reported on 01/12/2016 07/28/15   Danelle Berry, PA-C  ondansetron (ZOFRAN) 4 MG tablet Take 1 tablet (4 mg total) by mouth every 8 (eight) hours as needed for nausea or vomiting. 01/12/16   Shaune Pollack, MD  predniSONE (DELTASONE) 20 MG tablet Take 2 tablets (40 mg total) by mouth daily. 01/12/16 01/17/16  Shaune Pollack, MD  promethazine (PHENERGAN) 25 MG suppository Place 1 suppository (25 mg total) rectally every 8 (eight) hours as needed for refractory nausea / vomiting. 01/12/16   Shaune Pollack, MD  ranitidine (ZANTAC) 150 MG tablet Take 1 tablet (150 mg total) by mouth 2 (two) times daily. 01/12/16 01/19/16  Shaune Pollackameron Jisele Price, MD    Family History Family History  Problem Relation Age of Onset  . Diabetes Mother   . Hypertension Mother   . Hyperlipidemia Mother   . Gout Father     Social History Social History  Substance Use Topics  . Smoking status: Current Some Day Smoker  . Smokeless tobacco: Never Used  . Alcohol use Yes     Comment: occ     Allergies   Patient has no known allergies.   Review of Systems Review of Systems  Constitutional: Positive for fatigue. Negative for chills and fever.  HENT: Positive for congestion and  rhinorrhea.   Eyes: Negative for visual disturbance.  Respiratory: Positive for cough, chest tightness and shortness of breath. Negative for wheezing.   Cardiovascular: Negative for chest pain and leg swelling.  Gastrointestinal: Negative for abdominal pain, diarrhea, nausea and vomiting.  Genitourinary: Negative for dysuria and flank pain.  Musculoskeletal: Negative for neck pain and neck stiffness.  Skin: Negative for rash and wound.  Allergic/Immunologic: Negative for immunocompromised state.  Neurological: Negative for syncope, weakness and headaches.  All other systems reviewed and are negative.    Physical Exam Updated Vital Signs BP 124/62 (BP Location: Left Arm)   Pulse 101   Temp 98.6 F (37 C) (Oral)   Resp 18   Ht 5\' 10"  (1.778 m)   Wt (!) 301 lb (136.5 kg)   LMP 12/30/2015   SpO2 96%   BMI 43.19 kg/m   Physical Exam  Constitutional: She is oriented to person, place, and time. She appears well-developed and well-nourished. No distress.  HENT:  Head: Normocephalic and atraumatic.  No lip or oral swelling  Eyes: Conjunctivae are normal.  Neck: Neck supple.  Cardiovascular: Normal rate, regular rhythm and normal heart sounds.  Exam reveals no friction rub.   No murmur heard. Pulmonary/Chest: Effort normal. No respiratory distress. She has wheezes (Mild, end expiratory). She has no rales.  Abdominal: She exhibits no distension. There is no rigidity, no rebound, no guarding and negative Murphy's sign.  Musculoskeletal: She exhibits no edema.  Neurological: She is alert and oriented to person, place, and time. She exhibits normal muscle tone.  Skin: Skin is warm. Capillary refill takes less than 2 seconds.  Psychiatric: She has a normal mood and affect.  Nursing note and vitals reviewed.    ED Treatments / Results  Labs (all labs ordered are listed, but only abnormal results are displayed) Labs Reviewed  COMPREHENSIVE METABOLIC PANEL - Abnormal; Notable for the  following:       Result Value   Potassium 3.4 (*)    Total Protein 8.9 (*)    Anion gap 4 (*)    All other components within normal limits  I-STAT CHEM 8, ED - Abnormal; Notable for the following:    Potassium 3.4 (*)    All other components within normal limits  CBC WITH DIFFERENTIAL/PLATELET  LIPASE, BLOOD  I-STAT TROPOININ, ED  I-STAT BETA HCG BLOOD, ED ([facility], [facility], [facility] ONLY)  I-STAT TROPOININ, ED    EKG  EKG Interpretation  Date/Time:  Tuesday January 12 2016 09:10:54 EST Ventricular Rate:  93 PR Interval:    QRS Duration: 94 QT Interval:  372 QTC Calculation: 463 R Axis:   69 Text Interpretation:  Sinus rhythm No old tracing to compare No ST elevations or depressions evident EKG WITHIN NORMAL LIMITS mild baseline wander Confirmed by Shantanu Strauch MD, Larua Collier [phone]([id number]) on 01/12/2016 9:23:57 AM  Radiology Dg Chest 2 View  Result Date: 01/12/2016 CLINICAL DATA:  Shortness of breath. EXAM: CHEST  2 VIEW COMPARISON:  No recent prior . FINDINGS: Mediastinum and hilar structures normal. Heart size normal. Low lung volumes. No pleural effusion or pneumothorax. Thoracic spine scoliosis. IMPRESSION: Low lung volumes otherwise negative chest . Electronically Signed   By: Maisie Fushomas  Register   On: 01/12/2016 09:37   [place] Abdomen Limited Ruq  Result Date: 01/12/2016 CLINICAL DATA:  Onset of right upper quadrant pain this morning. History of hypertension. EXAM: US ABDOMEN LIMITED - RIGHT UPPER QUADRANT COMPARISON:  None in PACs FINDINGS: The images are somewhat limited due to the patient's body habitus. Gallbladder: No gallstones or wall thickening visualized. No sonographic Murphy sign noted by sonographer. Common bile duct: Diameter: 4.8 mm Liver: The hepatic echotexture is normal. There is no focal mass nor ductal dilation. IMPRESSION: Normal limited right upper quadrant abdominal ultrasound examination. Electronically Signed   By: David  Swazil[place] M.D.   On: 01/12/2016 12:26     Procedures Procedures (including critical care time)  Medications Ordered in ED Medications  albuterol (PROVENTIL) (2.5 MG/3ML) 0.083% nebulizer solution 5 mg (5 mg Nebulization Given 01/12/16 0936)  diphenhydrAMINE (BENADRYL) injection 50 mg (50 mg Intravenous Given 01/12/16 0954)  methylPREDNISolone sodium succinate (SOLU-MEDROL) 125 mg/2 mL injection 125 mg (125 mg Intravenous Given 01/12/16 0954)  gi cocktail (Maalox,Lidocaine,Donnatal) (30 mLs Oral Given 01/12/16 0954)  famotidine (PEPCID) IVPB 20 mg premix (0 mg Intravenous Stopped 01/12/16 1112)  ondansetron (ZOFRAN) injection 4 mg (4 mg Intravenous Given 01/12/16 1112)  sodium chloride 0.9 % bolus 1,000 mL (0 mLs Intravenous Stopped 01/12/16 1352)     Initial Impression / Assessment and Plan / ED Course  I have reviewed the triage vital signs and the nursing notes.  Pertinent labs & imaging results that were available during my care of the patient were reviewed by me and considered in my medical decision making (see chart for details).  Clinical Course     30 year old female who presents with multiple complaints. Patient has recently had a mild cough and nasal congestion and now expressed acute onset of nausea, vomiting, and chest pain after taking Mucinex. At this time, my primary suspicion is likely viral URI with possible GI involvement versus gastritis versus esophagitis in setting of taking Mucinex on an empty stomach. Patient concerned for possible allergic reaction. She has a mild wheeze but this has proceeded her current chest pain, nausea, and vomiting, and I have a very low suspicion of anaphylaxis at this time. However, given her history of allergies, will give Benadryl, Solumedrol, and plan to monitor closely in the ED. Otherwise, she has normal work of breathing with only mild wheezing and steroids will help this and will give a breathing treatment here. EKG is nonischemic. Regarding her chest pain, it has resolved with GI  cocktail. Will plan for delta troponin, although I suspect her symptoms are due to mild gastritis.  Delta troponin is negative. Patient did have one episode of emesis but is now tolerating by mouth without difficulty. Chest x-ray is clear. She has had no urticaria, recurrence of wheezing, persistent nausea/vomiting, diarrhea, or other evidence to suggest underlying anaphylaxis. She has remained very well-appearing after several hours of observation in the ED. Will treat for possible mild asthma exacerbation/URI with steroids, antibiotics, as well as give antiemetics. Strict return precautions were given in detail Patient will follow up with her PCP.  Final Clinical Impressions(s) / ED Diagnoses  Final diagnoses:  RUQ pain  Upper respiratory tract infection, unspecified type  Non-intractable vomiting with nausea, unspecified vomiting type    New Prescriptions Discharge Medication List as of 01/12/2016  1:41 PM    START taking these medications   Details  doxycycline (VIBRAMYCIN) 100 MG capsule Take 1 capsule (100 mg total) by mouth 2 (two) times daily., Starting Tue 01/12/2016, Until Tue 01/19/2016, Print    ondansetron (ZOFRAN) 4 MG tablet Take 1 tablet (4 mg total) by mouth every 8 (eight) hours as needed for nausea or vomiting., Starting Tue 01/12/2016, Print    predniSONE (DELTASONE) 20 MG tablet Take 2 tablets (40 mg total) by mouth daily., Starting Tue 01/12/2016, Until Sun 01/17/2016, Print    promethazine (PHENERGAN) 25 MG suppository Place 1 suppository (25 mg total) rectally every 8 (eight) hours as needed for refractory nausea / vomiting., Starting Tue 01/12/2016, Print    ranitidine (ZANTAC) 150 MG tablet Take 1 tablet (150 mg total) by mouth 2 (two) times daily., Starting Tue 01/12/2016, Until Tue 01/19/2016, Print         Shaune Pollack, MD 01/13/16 [phone]

## 2016-01-12 NOTE — ED Triage Notes (Signed)
Pt reports shortness of breath and nasal congestion this morning. sts took EXpectorant, and believes she has allergic reaction to this med. Reports chest tightness.  Pt denies NV. denies radiation of chest pain. Alert and oriented x4.

## 2016-01-18 ENCOUNTER — Inpatient Hospital Stay (HOSPITAL_COMMUNITY)
Admission: AD | Admit: 2016-01-18 | Discharge: 2016-01-19 | Disposition: A | Discharge status: Home / Self Care | Payer: Medicaid Other | Source: Ambulatory Visit | Attending: Family Medicine | Admitting: Family Medicine

## 2016-01-18 DIAGNOSIS — N939 Abnormal uterine and vaginal bleeding, unspecified: Secondary | ICD-10-CM | POA: Insufficient documentation

## 2016-01-18 DIAGNOSIS — Z3202 Encounter for pregnancy test, result negative: Secondary | ICD-10-CM | POA: Insufficient documentation

## 2016-01-18 DIAGNOSIS — F17200 Nicotine dependence, unspecified, uncomplicated: Secondary | ICD-10-CM | POA: Insufficient documentation

## 2016-01-18 LAB — POCT PREGNANCY, URINE: PREG TEST UR: NEGATIVE

## 2016-01-18 NOTE — MAU Note (Signed)
Pt reports she has been bleeding for about a week, is having nausea and dizziness. States the bleeding is just on the tissue when she wipes. She is not using a pad.

## 2016-01-19 ENCOUNTER — Encounter (HOSPITAL_COMMUNITY): Payer: Self-pay

## 2016-01-19 DIAGNOSIS — N939 Abnormal uterine and vaginal bleeding, unspecified: Secondary | ICD-10-CM

## 2016-01-19 LAB — WET PREP, GENITAL
SPERM: NONE SEEN
TRICH WET PREP: NONE SEEN
YEAST WET PREP: NONE SEEN

## 2016-01-19 LAB — CBC
HCT: 38.6 % (ref 36.0–46.0)
Hemoglobin: 12.8 g/dL (ref 12.0–15.0)
MCH: 26.5 pg (ref 26.0–34.0)
MCHC: 33.2 g/dL (ref 30.0–36.0)
MCV: 79.9 fL (ref 78.0–100.0)
PLATELETS: 367 10*3/uL (ref 150–400)
RBC: 4.83 MIL/uL (ref 3.87–5.11)
RDW: 15.5 % (ref 11.5–15.5)
WBC: 12.6 10*3/uL — ABNORMAL HIGH (ref 4.0–10.5)

## 2016-01-19 LAB — URINALYSIS, ROUTINE W REFLEX MICROSCOPIC
BILIRUBIN URINE: NEGATIVE
GLUCOSE, UA: NEGATIVE mg/dL
KETONES UR: NEGATIVE mg/dL
Leukocytes, UA: NEGATIVE
Nitrite: NEGATIVE
PROTEIN: NEGATIVE mg/dL
Specific Gravity, Urine: 1.025 (ref 1.005–1.030)
pH: 6 (ref 5.0–8.0)

## 2016-01-19 LAB — URINE MICROSCOPIC-ADD ON

## 2016-01-19 LAB — GC/CHLAMYDIA PROBE AMP (~~LOC~~) NOT AT ARMC
CHLAMYDIA, DNA PROBE: NEGATIVE
Neisseria Gonorrhea: NEGATIVE

## 2016-01-19 NOTE — Discharge Instructions (Signed)
°  GYN Providers [facility]    [facility]  [phone]     Phone: [phone]          [facility]                      [facility]  @[place]     Phone: [phone]  Phone: [phone]         [facility]     Phone: [phone]  Phone: [phone]           [facility] [place]                hone: [phone]  Phone: [phone]         [facility]                                            Phone: [phone]           [facility]                Phone: [phone]  [facility]   [address]Phone:[phone]    [facility] [place]          Phone: [phone] [facility]   Phone: [phone]

## 2016-01-19 NOTE — MAU Provider Note (Signed)
History     CSN: [id number]  Arrival date and time: 01/18/16 2308   First Provider Initiated Contact with Patient 01/19/16 0025      Chief Complaint  Patient presents with  . Vaginal Bleeding   Vaginal Bleeding  The patient's primary symptoms include vaginal bleeding. This is a new problem. The current episode started in the past 7 days. The problem occurs constantly. The problem has been gradually worsening. The patient is experiencing no pain. Associated symptoms include nausea. Pertinent negatives include no abdominal pain, chills, constipation, diarrhea, dysuria, fever, frequency, urgency or vomiting. The vaginal discharge was normal. The vaginal bleeding is typical of menses. She has not been passing clots. Nothing aggravates the symptoms. She has tried nothing for the symptoms. She is sexually active. She uses nothing for contraception. Her menstrual history has been regular (LMP 01/07/16, and then this bleeding started around 01/12/16).    Past Medical History:  Diagnosis Date  . Bronchitis   . Hypertension    pih  . No pertinent past medical history   . Pre-eclampsia     Past Surgical History:  Procedure Laterality Date  . NO PAST SURGERIES      Family History  Problem Relation Age of Onset  . Diabetes Mother   . Hypertension Mother   . Hyperlipidemia Mother   . Gout Father     Social History  Substance Use Topics  . Smoking status: Current Some Day Smoker  . Smokeless tobacco: Never Used  . Alcohol use Yes     Comment: occ    Allergies: No Known Allergies  Prescriptions Prior to Admission  Medication Sig Dispense Refill Last Dose  . albuterol (PROVENTIL HFA;VENTOLIN HFA) 108 (90 Base) MCG/ACT inhaler Inhale 1-2 puffs into the lungs every 6 (six) hours as needed for wheezing or shortness of breath. 1 Inhaler 0   . Chlorphen-PE-Acetaminophen (SINUS CONGESTION/PAIN DAY/NGHT PO) Take 2 tablets by mouth every 6 (six) hours as needed (sinus/pain).   01/12/2016 at  Unknown time  . cyclobenzaprine (FLEXERIL) 10 MG tablet Take 1 tablet (10 mg total) by mouth 2 (two) times daily as needed for muscle spasms. (Patient not taking: Reported on 01/12/2016) 20 tablet 0 Completed Course at Unknown time  . doxycycline (VIBRAMYCIN) 100 MG capsule Take 1 capsule (100 mg total) by mouth 2 (two) times daily. 14 capsule 0   . ibuprofen (ADVIL,MOTRIN) 800 MG tablet Take 1 tablet (800 mg total) by mouth 3 (three) times daily. (Patient not taking: Reported on 01/12/2016) 21 tablet 0 Completed Course at Unknown time  . ketorolac (TORADOL) 10 MG tablet Take 1 tablet (10 mg total) by mouth every 6 (six) hours as needed. (Patient not taking: Reported on 01/12/2016) 20 tablet 0 Completed Course at Unknown time  . ondansetron (ZOFRAN) 4 MG tablet Take 1 tablet (4 mg total) by mouth every 8 (eight) hours as needed for nausea or vomiting. 12 tablet 0   . promethazine (PHENERGAN) 25 MG suppository Place 1 suppository (25 mg total) rectally every 8 (eight) hours as needed for refractory nausea / vomiting. 4 each 0   . ranitidine (ZANTAC) 150 MG tablet Take 1 tablet (150 mg total) by mouth 2 (two) times daily. 14 tablet 0     Review of Systems  Constitutional: Negative for chills and fever.  Gastrointestinal: Positive for nausea. Negative for abdominal pain, constipation, diarrhea and vomiting.  Genitourinary: Positive for vaginal bleeding. Negative for dysuria, frequency and urgency.   Physical Exam   Blood  pressure 125/81, pulse 77, temperature 97.7 F (36.5 C), temperature source Oral, resp. rate 19, height 5\' 8"  (1.727 m), weight (!) 308 lb (139.7 kg), last menstrual period 12/30/2015, SpO2 100 %.  Physical Exam  Nursing note and vitals reviewed. Constitutional: She is oriented to person, place, and time. She appears well-developed and well-nourished. No distress.  HENT:  Head: Normocephalic.  Cardiovascular: Normal rate.   Respiratory: Effort normal.  GI: Soft. There is no  tenderness. There is no rebound.  Genitourinary:  Genitourinary Comments:  External: no lesion Vagina: scant amount of blood seen  Cervix: pink, smooth, no CMT Uterus: NSSC Adnexa: NT   Neurological: She is alert and oriented to person, place, and time.  Skin: Skin is warm and dry.  Psychiatric: She has a normal mood and affect.   Results for orders placed or performed during the hospital encounter of 01/18/16 (from the past 24 hour(s))  Urinalysis, Routine w reflex microscopic (not [facility])     Status: Abnormal   Collection Time: 01/18/16 11:44 PM  Result Value Ref Range   Color, Urine YELLOW YELLOW   APPearance CLEAR CLEAR   Specific Gravity, Urine 1.025 1.005 - 1.030   pH 6.0 5.0 - 8.0   Glucose, UA NEGATIVE NEGATIVE mg/dL   Hgb urine dipstick LARGE (A) NEGATIVE   Bilirubin Urine NEGATIVE NEGATIVE   Ketones, ur NEGATIVE NEGATIVE mg/dL   Protein, ur NEGATIVE NEGATIVE mg/dL   Nitrite NEGATIVE NEGATIVE   Leukocytes, UA NEGATIVE NEGATIVE  Urine microscopic-add on     Status: Abnormal   Collection Time: 01/18/16 11:44 PM  Result Value Ref Range   Squamous Epithelial / LPF 0-5 (A) NONE SEEN   WBC, UA 0-5 0 - 5 WBC/hpf   RBC / HPF 0-5 0 - 5 RBC/hpf   Bacteria, UA FEW (A) NONE SEEN   Urine-Other MUCOUS PRESENT   Pregnancy, urine POC     Status: None   Collection Time: 01/18/16 11:55 PM  Result Value Ref Range   Preg Test, Ur NEGATIVE NEGATIVE  Wet prep, genital     Status: Abnormal   Collection Time: 01/19/16 12:30 AM  Result Value Ref Range   Yeast Wet Prep HPF POC NONE SEEN NONE SEEN   Trich, Wet Prep NONE SEEN NONE SEEN   Clue Cells Wet Prep HPF POC PRESENT (A) NONE SEEN   WBC, Wet Prep HPF POC MODERATE (A) NONE SEEN   Sperm NONE SEEN   CBC     Status: Abnormal   Collection Time: 01/19/16 12:48 AM  Result Value Ref Range   WBC 12.6 (H) 4.0 - 10.5 K/uL   RBC 4.83 3.87 - 5.11 MIL/uL   Hemoglobin 12.8 12.0 - 15.0 g/dL   HCT 16.138.6 09.636.0 - 04.546.0 %   MCV 79.9 78.0 - 100.0  fL   MCH 26.5 26.0 - 34.0 pg   MCHC 33.2 30.0 - 36.0 g/dL   RDW 40.915.5 81.111.5 - 91.415.5 %   Platelets 367 150 - 400 K/uL    MAU Course  Procedures  MDM   Assessment and Plan   1. Abnormal uterine bleeding (AUB)    DC home Comfort measures reviewed  Expectant management. Will likely resolve on its own.  Menstrual calendar   Bleeding precautions RX: none  Return to MAU as needed List of GYN providers given   Follow-up Information    [facility]   Contact information: [address] [place] [id number] [phone]  Tawnya CrookHogan, Latiesha Harada Donovan 01/19/2016, 12:27 AM

## 2016-06-01 ENCOUNTER — Inpatient Hospital Stay (HOSPITAL_COMMUNITY)
Admission: AD | Admit: 2016-06-01 | Discharge: 2016-06-01 | Disposition: A | Discharge status: Home / Self Care | Payer: Medicaid Other | Source: Ambulatory Visit | Attending: Obstetrics & Gynecology | Admitting: Obstetrics & Gynecology

## 2016-06-01 DIAGNOSIS — N912 Amenorrhea, unspecified: Secondary | ICD-10-CM | POA: Insufficient documentation

## 2016-06-01 NOTE — MAU Note (Signed)
Pt states she had a +upt at home and wants to confirm pregnancy. Pt denies pain or vaginal bleeding. LMP: 04/22/2016.

## 2016-06-01 NOTE — MAU Provider Note (Signed)
S: Lisa Charles is a 31 y.o. G1P1001 who presents today for a pregnancy test. She reports that she has had a +UPT at home. She has no complaints today. She denies any VB or abdominal pain.  O: VSS, NAD Abd soft, NT A/P: Amenorrhea, +UPT at home Advised patient that we cannot provide pregnancy confirmation here. She can walk-in to the clinic during regular hours for pregnancy confirmation.  Tawnya CrookHogan, Lisa Charles  06/01/16 10:03 PM

## 2016-06-02 ENCOUNTER — Emergency Department (HOSPITAL_COMMUNITY)
Admission: EM | Admit: 2016-06-02 | Discharge: 2016-06-02 | Disposition: A | Discharge status: Home / Self Care | Payer: Medicaid Other | Attending: Emergency Medicine | Admitting: Emergency Medicine

## 2016-06-02 ENCOUNTER — Encounter (HOSPITAL_COMMUNITY): Payer: Self-pay | Admitting: Emergency Medicine

## 2016-06-02 DIAGNOSIS — R05 Cough: Secondary | ICD-10-CM | POA: Diagnosis not present

## 2016-06-02 DIAGNOSIS — Z79899 Other long term (current) drug therapy: Secondary | ICD-10-CM | POA: Diagnosis not present

## 2016-06-02 DIAGNOSIS — I10 Essential (primary) hypertension: Secondary | ICD-10-CM | POA: Diagnosis not present

## 2016-06-02 DIAGNOSIS — J029 Acute pharyngitis, unspecified: Secondary | ICD-10-CM | POA: Diagnosis not present

## 2016-06-02 DIAGNOSIS — F17200 Nicotine dependence, unspecified, uncomplicated: Secondary | ICD-10-CM | POA: Diagnosis not present

## 2016-06-02 DIAGNOSIS — R059 Cough, unspecified: Secondary | ICD-10-CM

## 2016-06-02 DIAGNOSIS — O99331 Smoking (tobacco) complicating pregnancy, first trimester: Secondary | ICD-10-CM | POA: Insufficient documentation

## 2016-06-02 DIAGNOSIS — Z3A01 Less than 8 weeks gestation of pregnancy: Secondary | ICD-10-CM | POA: Insufficient documentation

## 2016-06-02 DIAGNOSIS — O99511 Diseases of the respiratory system complicating pregnancy, first trimester: Secondary | ICD-10-CM | POA: Insufficient documentation

## 2016-06-02 LAB — POC URINE PREG, ED: Preg Test, Ur: POSITIVE — AB

## 2016-06-02 LAB — RAPID STREP SCREEN (MED CTR MEBANE ONLY): Streptococcus, Group A Screen (Direct): NEGATIVE

## 2016-06-02 MED ORDER — PRENATAL COMPLETE 14-0.4 MG PO TABS: 1.0000 | ORAL_TABLET | Freq: Every day | ORAL | 1 refills | Status: AC

## 2016-06-02 NOTE — Progress Notes (Signed)
[facility]. Just wants to know her due date. Pregnancy verification letter given to her.

## 2016-06-02 NOTE — ED Triage Notes (Signed)
Pt c/o right ear pain, congestion, cough with green mucus, sore throat, nausea, hypersensitivity to smells, nose burning. Pt reports she felt dizzy at work, similar to how she felt the last time she was pregnant, is late on her period which is unusual for her. No emesis or diarrhea.

## 2016-06-02 NOTE — ED Provider Notes (Signed)
Emergency Department Provider Note   I have reviewed the triage vital signs and the nursing notes.   HISTORY  Chief Complaint Otalgia; Sore Throat; and Dizziness   HPI Lisa Charles is a 31 y.o. female with PMH of HTN and pre-eclampsia presents to the emergency department for evaluation of sore throat, runny nose, earache, cough over the past week. Patient is alsosome associated nausea and fatigue until similar to the last time she was pregnant. She did a home pregnancy test yesterday which was positive but wants to "just make sure." She has not been taking medications at home for her URI/sinusitis symptoms. No fevers or chills. No abdominal pain. She denies any vaginal bleeding or discharge. No modifying factors. No associated symptoms. No radiation. Symptoms are mild and persistent.   Past Medical History:  Diagnosis Date  . Bronchitis   . Hypertension    pih  . No pertinent past medical history   . Pre-eclampsia     Patient Active Problem List   Diagnosis Date Noted  . Preeclampsia 02/20/2012  . Active labor 02/20/2012  . Normal delivery 02/20/2012    Past Surgical History:  Procedure Laterality Date  . NO PAST SURGERIES      Current Outpatient Rx  . Order #: [id number] Class: Print  . Order #: [id number] Class: Historical Med  . Order #: [id number] Class: Print  . Order #: [id number] Class: Print  . Order #: [id number] Class: Print  . Order #: [id number] Class: Print  . Order #: [id number] Class: Print  . Order #: [id number] Class: Print  . Order #: [id number] Class: Print    Allergies Patient has no known allergies.  Family History  Problem Relation Age of Onset  . Diabetes Mother   . Hypertension Mother   . Hyperlipidemia Mother   . Gout Father     Social History Social History  Substance Use Topics  . Smoking status: Current Some Day Smoker  . Smokeless tobacco: Never Used  . Alcohol use Yes     Comment: occ    Review of Systems  Constitutional: No  fever/chills. Positive fatigue.  Eyes: No visual changes. ENT: Positive sore throat. Cardiovascular: Denies chest pain. Respiratory: Denies shortness of breath. Gastrointestinal: No abdominal pain. Positive nausea, no vomiting.  No diarrhea.  No constipation. Genitourinary: Negative for dysuria. Musculoskeletal: Negative for back pain. Skin: Negative for rash. Neurological: Negative for headaches, focal weakness or numbness.  10-point ROS otherwise negative.  ____________________________________________   PHYSICAL EXAM:  VITAL SIGNS: ED Triage Vitals  Enc Vitals Group     BP 06/02/16 0817 116/60     Pulse Rate 06/02/16 0817 77     Resp 06/02/16 0817 20     Temp 06/02/16 0817 98.1 F (36.7 C)     Temp Source 06/02/16 0817 Oral     SpO2 06/02/16 0817 98 %     Weight 06/02/16 0817 (!) 314 lb (142.4 kg)     Height 06/02/16 0817 5\' 10"  (1.778 m)     Pain Score 06/02/16 0826 5   Constitutional: Alert and oriented. Well appearing and in no acute distress. Eyes/Ears: Conjunctivae are normal. Normal TMs bilaterally. No erythema or bulging.  Head: Atraumatic. Nose: Mild congestion/rhinnorhea. Mouth/Throat: Mucous membranes are moist.  Oropharynx with mild erythema and tonsillar hypertrophy. No exudate.  Neck: No stridor. Cardiovascular: Normal rate, regular rhythm. Good peripheral circulation. Grossly normal heart sounds.   Respiratory: Normal respiratory effort.  No retractions. Lungs CTAB. Gastrointestinal: Soft and nontender. No distention.  Musculoskeletal: No lower extremity  tenderness nor edema. No gross deformities of extremities. Neurologic:  Normal speech and language. No gross focal neurologic deficits are appreciated.  Skin:  Skin is warm, dry and intact. No rash noted. Psychiatric: Mood and affect are normal. Speech and behavior are normal.  ____________________________________________   LABS (all labs ordered are listed, but only abnormal results are  displayed)  Labs Reviewed  POC URINE PREG, ED - Abnormal; Notable for the following:       Result Value   Preg Test, Ur POSITIVE (*)    All other components within normal limits  RAPID STREP SCREEN (NOT AT [facility])  CULTURE, GROUP A STREP [facility])   ____________________________________________   PROCEDURES  Procedure(s) performed:   Procedures  None ____________________________________________   INITIAL IMPRESSION / ASSESSMENT AND PLAN / ED COURSE  Pertinent labs & imaging results that were available during my care of the patient were reviewed by me and considered in my medical decision making (see chart for details).  Patient resents to the emergency department for evaluation of sore throat, earache, and cough. Symptoms are most consistent with sinusitis. Very low suspicion for strep pharyngitis. Rapid strep ordered from triage. Patient also feeling signs and symptoms of early pregnancy with nausea and fatigue. This is her second pregnancy. Her first is complicated by preeclampsia. Had a positive home pregnancy test earlier this week. Repeat testing in the emergency department is also positive. Discussed with her I given her history of hypertension she will need to follow with an OB/GYN very closely during this pregnancy.   09:02 AM Rapid strep negative. Provided prenatal vitamin and list of medications for use during pregnancy. Provided contact information for local OB clinic for follow up. Discussed results and f/u plan in detail with the patient.   At this time, I do not feel there is any life-threatening condition present. I have reviewed and discussed all results (EKG, imaging, lab, urine as appropriate), exam findings with patient. I have reviewed nursing notes and appropriate previous records.  I feel the patient is safe to be discharged home without further emergent workup. Discussed usual and customary return precautions. Patient and family (if present) verbalize understanding  and are comfortable with this plan.  Patient will follow-up with their primary care provider. If they do not have a primary care provider, information for follow-up has been provided to them. All questions have been answered.  ____________________________________________  FINAL CLINICAL IMPRESSION(S) / ED DIAGNOSES  Final diagnoses:  Less than [redacted] weeks gestation of pregnancy  Sore throat  Cough     MEDICATIONS GIVEN DURING THIS VISIT:  None  NEW OUTPATIENT MEDICATIONS STARTED DURING THIS VISIT:  New Prescriptions   PRENATAL VIT-FE FUMARATE-FA (PRENATAL COMPLETE) 14-0.4 MG TABS    Take 1 tablet by mouth daily.      Note:  This document was prepared using Dragon voice recognition software and may include unintentional dictation errors.  Alona Bene, MD Emergency Medicine   Maia Plan, MD 06/02/16 [phone]

## 2016-06-02 NOTE — Discharge Instructions (Signed)
Condition  °Safe Medications to Take During Pregnancy*  °Allergy  °Antihistamines including: °Chlorpheniramine (Chlor-Trimeton, Efidac, Teldrin) °Diphenhydramine (Benadryl) °Loratadine (Alavert, Claritin, Loradamed, Tavist ND Allergy) °Nasal spray oxymetazoline (Afrin, Neo-Synephrine) (Check with your doctor first.) °Steroid nasal spray (Rhinocort) (Check with your doctor first.) °Cold and Flu  °Robitussin (check which ones, some should not be use din 1st trimester), Trind-DM, Vicks Cough Syrup °Saline nasal drops or spray °Actifed, Dristan, Neosynephrine*, Sudafed (Check with your doctor first. Do not use in first trimester.) °Tylenol (acetaminophen) or Tylenol Cold °Warm salt/water gargle °*Do not take "SA" (sustained action) forms or "Multi-Symptom" forms of these drugs. °Constipation  °Citrucil °Colace  °Fiberall/Fibercon °Metamucil  °Milk of Magnesia  °Senekot °Diarrhea  °For 24 hours, only after 12 weeks of pregnancy:  ° °Imodium  °Kaopectate  °Parepectolin °First Aid Ointment  °Bacitracin  °J & J °Neosporin  °Headache Tylenol (acetaminophen) Heartburn  °Gaviscon °Maalox  °Mylanta  °Riopan °Titralac °TUMs °Hemorrhoids  °Anusol  °Preparation H  °Tucks  °Witch hazel °Nausea and Vomiting  °Emetrex °Emetrol (if not diabetic) °Sea bands °Vitamin B6 (100 mg tablet) °Rashes  °Benadryl cream °Caladryl lotion or cream °Hydrocortisone cream or ointment °Oatmeal bath (Aveeno) °Yeast Infection  °Monistat or Terazol °Do not insert applicator too far  °*Please Note: No drug can be considered 100% safe to use during pregnancy.  ° °

## 2016-06-04 LAB — CULTURE, GROUP A STREP (THRC)

## 2016-06-08 ENCOUNTER — Emergency Department (HOSPITAL_COMMUNITY): Payer: Medicaid Other

## 2016-06-08 ENCOUNTER — Encounter (HOSPITAL_COMMUNITY): Payer: Self-pay | Admitting: *Deleted

## 2016-06-08 ENCOUNTER — Emergency Department (HOSPITAL_COMMUNITY)
Admission: EM | Admit: 2016-06-08 | Discharge: 2016-06-08 | Disposition: A | Discharge status: Home / Self Care | Payer: Medicaid Other | Attending: Emergency Medicine | Admitting: Emergency Medicine

## 2016-06-08 DIAGNOSIS — I10 Essential (primary) hypertension: Secondary | ICD-10-CM | POA: Insufficient documentation

## 2016-06-08 DIAGNOSIS — O23591 Infection of other part of genital tract in pregnancy, first trimester: Secondary | ICD-10-CM | POA: Insufficient documentation

## 2016-06-08 DIAGNOSIS — O99331 Smoking (tobacco) complicating pregnancy, first trimester: Secondary | ICD-10-CM | POA: Insufficient documentation

## 2016-06-08 DIAGNOSIS — Z3491 Encounter for supervision of normal pregnancy, unspecified, first trimester: Secondary | ICD-10-CM

## 2016-06-08 DIAGNOSIS — R102 Pelvic and perineal pain: Secondary | ICD-10-CM | POA: Insufficient documentation

## 2016-06-08 DIAGNOSIS — F17200 Nicotine dependence, unspecified, uncomplicated: Secondary | ICD-10-CM | POA: Insufficient documentation

## 2016-06-08 DIAGNOSIS — Z3A01 Less than 8 weeks gestation of pregnancy: Secondary | ICD-10-CM | POA: Diagnosis not present

## 2016-06-08 DIAGNOSIS — O26893 Other specified pregnancy related conditions, third trimester: Secondary | ICD-10-CM

## 2016-06-08 DIAGNOSIS — B9689 Other specified bacterial agents as the cause of diseases classified elsewhere: Secondary | ICD-10-CM | POA: Diagnosis not present

## 2016-06-08 DIAGNOSIS — R109 Unspecified abdominal pain: Secondary | ICD-10-CM

## 2016-06-08 DIAGNOSIS — O26891 Other specified pregnancy related conditions, first trimester: Secondary | ICD-10-CM | POA: Diagnosis present

## 2016-06-08 DIAGNOSIS — N760 Acute vaginitis: Secondary | ICD-10-CM

## 2016-06-08 LAB — COMPREHENSIVE METABOLIC PANEL
ALBUMIN: 4.3 g/dL (ref 3.5–5.0)
ALK PHOS: 55 U/L (ref 38–126)
ALT: 25 U/L (ref 14–54)
ANION GAP: 7 (ref 5–15)
AST: 27 U/L (ref 15–41)
BUN: 8 mg/dL (ref 6–20)
CO2: 25 mmol/L (ref 22–32)
Calcium: 9.3 mg/dL (ref 8.9–10.3)
Chloride: 105 mmol/L (ref 101–111)
Creatinine, Ser: 0.57 mg/dL (ref 0.44–1.00)
GFR calc Af Amer: >60 mL/min (ref >60)
GFR calc non Af Amer: >60 mL/min (ref >60)
Glucose, Bld: 92 mg/dL (ref 65–99)
Potassium: 3.4 mmol/L — ABNORMAL LOW (ref 3.5–5.1)
SODIUM: 137 mmol/L (ref 135–145)
Total Bilirubin: 0.4 mg/dL (ref 0.3–1.2)
Total Protein: 8.2 g/dL — ABNORMAL HIGH (ref 6.5–8.1)

## 2016-06-08 LAB — URINALYSIS, ROUTINE W REFLEX MICROSCOPIC
BILIRUBIN URINE: NEGATIVE
Bacteria, UA: NONE SEEN
Glucose, UA: NEGATIVE mg/dL
Hgb urine dipstick: NEGATIVE
KETONES UR: 5 mg/dL — AB
Nitrite: NEGATIVE
PH: 5 (ref 5.0–8.0)
Protein, ur: NEGATIVE mg/dL
Specific Gravity, Urine: 1.025 (ref 1.005–1.030)

## 2016-06-08 LAB — CBC
HCT: 37.2 % (ref 36.0–46.0)
HEMOGLOBIN: 11.9 g/dL — AB (ref 12.0–15.0)
MCH: 25.7 pg — ABNORMAL LOW (ref 26.0–34.0)
MCHC: 32 g/dL (ref 30.0–36.0)
MCV: 80.3 fL (ref 78.0–100.0)
PLATELETS: 320 10*3/uL (ref 150–400)
RBC: 4.63 MIL/uL (ref 3.87–5.11)
RDW: 16.7 % — ABNORMAL HIGH (ref 11.5–15.5)
WBC: 9.4 10*3/uL (ref 4.0–10.5)

## 2016-06-08 LAB — WET PREP, GENITAL
SPERM: NONE SEEN
Trich, Wet Prep: NONE SEEN
YEAST WET PREP: NONE SEEN

## 2016-06-08 LAB — HCG, QUANTITATIVE, PREGNANCY: hCG, Beta Chain, Quant, S: 26578 m[IU]/mL — ABNORMAL HIGH (ref <5)

## 2016-06-08 LAB — LIPASE, BLOOD: Lipase: 17 U/L (ref 11–51)

## 2016-06-08 MED ORDER — PRENATAL COMPLETE 14-0.4 MG PO TABS: 1.0000 | ORAL_TABLET | Freq: Every day | ORAL | 5 refills | Status: DC

## 2016-06-08 MED ORDER — METRONIDAZOLE 500 MG PO TABS: 500.0000 mg | ORAL_TABLET | Freq: Two times a day (BID) | ORAL | 0 refills | Status: DC

## 2016-06-08 MED ORDER — DOXYLAMINE-PYRIDOXINE 10-10 MG PO TBEC: 1.0000 | DELAYED_RELEASE_TABLET | Freq: Two times a day (BID) | ORAL | 0 refills | Status: DC | PRN

## 2016-06-08 MED ORDER — ACETAMINOPHEN 500 MG PO TABS
1000.0000 mg | ORAL_TABLET | Freq: Once | ORAL | Status: AC
  Administered 2016-06-08: 1000 mg via ORAL
  Filled 2016-06-08: qty 2

## 2016-06-08 NOTE — ED Provider Notes (Signed)
[facility]-EMERGENCY DEPT Provider Note   CSN: [id number] Arrival date & time: 06/08/16  1358     History   Chief Complaint Chief Complaint  Patient presents with  . Abdominal Pain    HPI Lisa Charles is a 31 y.o. female.  HPI   Pt with hx HTN, preeclampsia, G2P1001, currently nearly [redacted] weeks pregnant, LMP Feb 16 p/w intermittent abdominal cramping x 3 weeks, worse with walking and certain positions, gradually worsening.  Also has had nausea x 3 weeks, vomiting that started last night.  Generalized fatigue.  Denies fevers, urinary symptoms, abnormal vaginal discharge or bleeding, bowel changes.  Has an appointment with [facility] but has not yet seen an OBGYN or had an ultrasound.    Was seen for viral respiratory infection 06/02/16 - reports symptoms have resolved.   Past Medical History:  Diagnosis Date  . Bronchitis   . Hypertension    pih  . No pertinent past medical history   . Pre-eclampsia     Patient Active Problem List   Diagnosis Date Noted  . Preeclampsia 02/20/2012  . Active labor 02/20/2012  . Normal delivery 02/20/2012    Past Surgical History:  Procedure Laterality Date  . NO PAST SURGERIES      OB History    Gravida Para Term Preterm AB Living   0 0 1   SAB TAB Ectopic Multiple Live Births   0 0 0 0 1       Home Medications    Prior to Admission medications   Medication Sig Start Date End Date Taking? Authorizing Provider  diphenhydrAMINE (BENADRYL) 25 MG tablet Take 25 mg by mouth every 6 (six) hours as needed for itching, allergies or sleep.   Yes Historical Provider, MD  albuterol (PROVENTIL HFA;VENTOLIN HFA) 108 (90 Base) MCG/ACT inhaler Inhale 1-2 puffs into the lungs every 6 (six) hours as needed for wheezing or shortness of breath. 01/12/16   Shaune Pollack, MD  Chlorphen-PE-Acetaminophen (SINUS CONGESTION/PAIN DAY/NGHT PO) Take 2 tablets by mouth every 6 (six) hours as needed (sinus/pain).    Historical Provider, MD  cyclobenzaprine  (FLEXERIL) 10 MG tablet Take 1 tablet (10 mg total) by mouth 2 (two) times daily as needed for muscle spasms. Patient not taking: Reported on 01/12/2016 07/28/15   Danelle Berry, PA-C  Doxylamine-Pyridoxine 10-10 MG TBEC Take 1 tablet by mouth 3 times/day as needed-between meals & bedtime (nausea). 06/08/16   Trixie Dredge, PA-C  ibuprofen (ADVIL,MOTRIN) 800 MG tablet Take 1 tablet (800 mg total) by mouth 3 (three) times daily. Patient not taking: Reported on 01/12/2016 07/28/15   Danelle Berry, PA-C  ketorolac (TORADOL) 10 MG tablet Take 1 tablet (10 mg total) by mouth every 6 (six) hours as needed. Patient not taking: Reported on 01/12/2016 07/28/15   Danelle Berry, PA-C  metroNIDAZOLE (FLAGYL) 500 MG tablet Take 1 tablet (500 mg total) by mouth 2 (two) times daily. One po bid x 7 days 06/08/16   Trixie Dredge, PA-C  ondansetron (ZOFRAN) 4 MG tablet Take 1 tablet (4 mg total) by mouth every 8 (eight) hours as needed for nausea or vomiting. 01/12/16   Shaune Pollack, MD  Prenatal Vit-Fe Fumarate-FA (PRENATAL COMPLETE) 14-0.4 MG TABS Take 1 tablet by mouth daily. 06/02/16 07/02/16  Maia Plan, MD  Prenatal Vit-Fe Fumarate-FA (PRENATAL COMPLETE) 14-0.4 MG TABS Take 1 tablet by mouth daily. 06/08/16   Trixie Dredge, PA-C  promethazine (PHENERGAN) 25 MG suppository Place 1 suppository (25 mg total) rectally  every 8 (eight) hours as needed for refractory nausea / vomiting. 01/12/16   Shaune Pollack, MD  ranitidine (ZANTAC) 150 MG tablet Take 1 tablet (150 mg total) by mouth 2 (two) times daily. 01/12/16 01/19/16  Shaune Pollack, MD    Family History Family History  Problem Relation Age of Onset  . Diabetes Mother   . Hypertension Mother   . Hyperlipidemia Mother   . Gout Father     Social History Social History  Substance Use Topics  . Smoking status: Current Some Day Smoker  . Smokeless tobacco: Never Used  . Alcohol use Yes     Comment: occ     Allergies   Patient has no known allergies.   Review of  Systems Review of Systems  All other systems reviewed and are negative.    Physical Exam Updated Vital Signs BP 128/71 (BP Location: Right Arm)   Pulse 72   Temp 98 F (36.7 C) (Oral)   Resp 18   LMP 04/22/2016   SpO2 99%   Physical Exam  Constitutional: She appears well-developed and well-nourished. No distress.  HENT:  Head: Normocephalic and atraumatic.  Neck: Neck supple.  Cardiovascular: Normal rate and regular rhythm.   Pulmonary/Chest: Effort normal and breath sounds normal. No respiratory distress. She has no wheezes. She has no rales.  Abdominal: Soft. She exhibits no distension. There is no tenderness. There is no rebound and no guarding.  obese  Genitourinary:  Genitourinary Comments: Small amount of thick yellow discharge in the vagina.  Cervical os is closed.  No CMT.  Bimanual exam somewhat limited secondary to body habitus but no noted tenderness or mass/fullness.    Neurological: She is alert.  Skin: She is not diaphoretic.  Nursing note and vitals reviewed.    ED Treatments / Results  Labs (all labs ordered are listed, but only abnormal results are displayed) Labs Reviewed  WET PREP, GENITAL - Abnormal; Notable for the following:       Result Value   Clue Cells Wet Prep HPF POC PRESENT (*)    WBC, Wet Prep HPF POC MANY (*)    All other components within normal limits  COMPREHENSIVE METABOLIC PANEL - Abnormal; Notable for the following:    Potassium 3.4 (*)    Total Protein 8.2 (*)    All other components within normal limits  CBC - Abnormal; Notable for the following:    Hemoglobin 11.9 (*)    MCH 25.7 (*)    RDW 16.7 (*)    All other components within normal limits  URINALYSIS, ROUTINE W REFLEX MICROSCOPIC - Abnormal; Notable for the following:    APPearance HAZY (*)    Ketones, ur 5 (*)    Leukocytes, UA LARGE (*)    Squamous Epithelial / LPF 6-30 (*)    All other components within normal limits  HCG, QUANTITATIVE, PREGNANCY - Abnormal;  Notable for the following:    hCG, Beta Chain, Quant, S 26,578 (*)    All other components within normal limits  LIPASE, BLOOD  RPR  HIV ANTIBODY (ROUTINE TESTING)  ABO/RH  GC/CHLAMYDIA PROBE AMP ([place]) NOT AT [facility]    EKG  EKG Interpretation None       Radiology US Ob Comp < 14 Wks  Result Date: 06/08/2016 CLINICAL DATA:  Abdominal pain for 3 weeks EXAM: OBSTETRIC <14 WK [place] AND TRANSVAGINAL OB US TECHNIQUE: Both transabdominal and transvaginal ultrasound examinations were performed for complete evaluation of the gestation as well  as the maternal uterus, adnexal regions, and pelvic cul-de-sac. Transvaginal technique was performed to assess early pregnancy. COMPARISON:  None. FINDINGS: Intrauterine gestational sac: Single intrauterine gestation Yolk sac:  Visualized Embryo:  Visualized Cardiac Activity: Visualized Heart Rate: 121  bpm CRL:  4.7  mm   6 w   1 d                  [place] EDC: 02/01/2017 Subchorionic hemorrhage:  None visualized. Maternal uterus/adnexae: Bilateral ovaries grossly within normal limits. Right ovary measures 2.8 x 1.7 x 1.6 cm. Left ovary is visualized on the trans abdominal images and measures 3.1 x 2.8 by 2.9 cm. No significant free fluid. IMPRESSION: Single viable intrauterine gestation as above. Electronically Signed   By: Jasmine Pang M.D.   On: 06/08/2016 21:01   US Ob Transvaginal  Result Date: 06/08/2016 CLINICAL DATA:  Abdominal pain for 3 weeks EXAM: OBSTETRIC <14 WK [place] AND TRANSVAGINAL OB US TECHNIQUE: Both transabdominal and transvaginal ultrasound examinations were performed for complete evaluation of the gestation as well as the maternal uterus, adnexal regions, and pelvic cul-de-sac. Transvaginal technique was performed to assess early pregnancy. COMPARISON:  None. FINDINGS: Intrauterine gestational sac: Single intrauterine gestation Yolk sac:  Visualized Embryo:  Visualized Cardiac Activity: Visualized Heart Rate: 121  bpm CRL:  4.7  mm   6 w   1 d                   [place] EDC: 02/01/2017 Subchorionic hemorrhage:  None visualized. Maternal uterus/adnexae: Bilateral ovaries grossly within normal limits. Right ovary measures 2.8 x 1.7 x 1.6 cm. Left ovary is visualized on the trans abdominal images and measures 3.1 x 2.8 by 2.9 cm. No significant free fluid. IMPRESSION: Single viable intrauterine gestation as above. Electronically Signed   By: Jasmine Pang M.D.   On: 06/08/2016 21:01    Procedures Procedures (including critical care time)  Medications Ordered in ED Medications  acetaminophen (TYLENOL) tablet 1,000 mg (1,000 mg Oral Given 06/08/16 2107)     Initial Impression / Assessment and Plan / ED Course  I have reviewed the triage vital signs and the nursing notes.  Pertinent labs & imaging results that were available during my care of the patient were reviewed by me and considered in my medical decision making (see chart for details).    I have discussed with patient the safety of medications in pregnancy, the fact that our knowledge of drug safety in pregnancy is limited and that I can only make recommendations based on what is currently known at this time and the safety ratings given to medications.  We discussed that medications may pass through the placenta and have encouraged them to make their own decisions regarding the medications used in light of this information.    Afebrile, nontoxic patient with abdominal cramping, N/V in early pregnancy.  US demonstrates IUP.  Pelvic exam with closed os.  Wet prep shows clue cells.  Will treat for BV.    D/C home with diclegis, flagyl, prenatal vitamins, OBGYN follow up.  Discussed result, findings, treatment, and follow up  with patient.  Pt given return precautions.  Pt verbalizes understanding and agrees with plan.       Final Clinical Impressions(s) / ED Diagnoses   Final diagnoses:  Pelvic pain  Abdominal pain in pregnancy, third trimester  Normal IUP (intrauterine pregnancy) on prenatal  ultrasound, first trimester  BV (bacterial vaginosis)    New Prescriptions New Prescriptions  DOXYLAMINE-PYRIDOXINE 10-10 MG TBEC    Take 1 tablet by mouth 3 times/day as needed-between meals & bedtime (nausea).   METRONIDAZOLE (FLAGYL) 500 MG TABLET    Take 1 tablet (500 mg total) by mouth 2 (two) times daily. One po bid x 7 days   PRENATAL VIT-FE FUMARATE-FA (PRENATAL COMPLETE) 14-0.4 MG TABS    Take 1 tablet by mouth daily.     Trixie Dredge, PA-C 06/08/16 2140    Vanetta Mulders, MD 06/15/16 [phone]

## 2016-06-08 NOTE — ED Triage Notes (Addendum)
Pt complains of lower abdominal pain, n/v for the past 3 weeks. Pt states she initially had diarrhea but no longer has diarrhea.   Pt is [redacted] weeks pregnant. Pt denies vaginal bleeding.

## 2016-06-08 NOTE — Discharge Instructions (Signed)
Read the information below.  Use the prescribed medication as directed.  Please discuss all new medications with your pharmacist.  You may return to the Emergency Department at any time for worsening condition or any new symptoms that concern you.   If you develop high fevers, worsening abdominal pain, uncontrolled vomiting, or are unable to tolerate fluids by mouth, return to the ER for a recheck.  ° °

## 2016-06-09 LAB — ABO/RH: ABO/RH(D): A POS

## 2016-06-09 LAB — RPR: RPR Ser Ql: NONREACTIVE

## 2016-06-09 LAB — GC/CHLAMYDIA PROBE AMP (~~LOC~~) NOT AT ARMC
CHLAMYDIA, DNA PROBE: NEGATIVE
NEISSERIA GONORRHEA: NEGATIVE

## 2016-06-09 LAB — HIV ANTIBODY (ROUTINE TESTING W REFLEX): HIV SCREEN 4TH GENERATION: NONREACTIVE

## 2016-06-16 ENCOUNTER — Inpatient Hospital Stay (HOSPITAL_COMMUNITY)
Admission: AD | Admit: 2016-06-16 | Discharge: 2016-06-16 | Disposition: A | Discharge status: Home / Self Care | Payer: Medicaid Other | Source: Ambulatory Visit | Attending: Obstetrics and Gynecology | Admitting: Obstetrics and Gynecology

## 2016-06-16 ENCOUNTER — Encounter (HOSPITAL_COMMUNITY): Payer: Self-pay | Admitting: *Deleted

## 2016-06-16 DIAGNOSIS — O99611 Diseases of the digestive system complicating pregnancy, first trimester: Secondary | ICD-10-CM | POA: Diagnosis not present

## 2016-06-16 DIAGNOSIS — Z87891 Personal history of nicotine dependence: Secondary | ICD-10-CM | POA: Insufficient documentation

## 2016-06-16 DIAGNOSIS — Z3A01 Less than 8 weeks gestation of pregnancy: Secondary | ICD-10-CM | POA: Diagnosis not present

## 2016-06-16 DIAGNOSIS — K529 Noninfective gastroenteritis and colitis, unspecified: Secondary | ICD-10-CM | POA: Diagnosis not present

## 2016-06-16 DIAGNOSIS — R109 Unspecified abdominal pain: Secondary | ICD-10-CM | POA: Diagnosis present

## 2016-06-16 DIAGNOSIS — O9989 Other specified diseases and conditions complicating pregnancy, childbirth and the puerperium: Secondary | ICD-10-CM | POA: Diagnosis not present

## 2016-06-16 LAB — URINALYSIS, ROUTINE W REFLEX MICROSCOPIC
BACTERIA UA: NONE SEEN
BILIRUBIN URINE: NEGATIVE
GLUCOSE, UA: NEGATIVE mg/dL
HGB URINE DIPSTICK: NEGATIVE
KETONES UR: NEGATIVE mg/dL
NITRITE: NEGATIVE
PROTEIN: 30 mg/dL — AB
Specific Gravity, Urine: 1.018 (ref 1.005–1.030)
pH: 7 (ref 5.0–8.0)

## 2016-06-16 MED ORDER — PROMETHAZINE HCL 25 MG PO TABS: 25.0000 mg | ORAL_TABLET | Freq: Four times a day (QID) | ORAL | 0 refills | Status: DC | PRN

## 2016-06-16 NOTE — MAU Provider Note (Signed)
History     CSN: [id number]  Arrival date and time: 06/16/16 0830   First Provider Initiated Contact with Patient 06/16/16 [phone]      Chief Complaint  Patient presents with  . Abdominal Pain  . Emesis  . Diarrhea   HPI  Lisa Charles is a 31 y.o. G2P1001 at [redacted]w[redacted]d who presents with flank pain & n/v/d. IUP confirmed by ultrasound on 06/08/16.  Nausea & vomiting present throughout pregnancy. Has vomited 3 times today. Was prescribed diclegis but is only taking PRN. Diarrhea began this morning. Reports 3 episodes of watery/loose stools. Left flank pain present since finding out she was pregnant. Was intermittent but has been constant since 1 am this morning. Rates pain 8/10. Has not treated. Nothing makes better or worse.  Denies fever/chills, urinary complaints, vaginal bleeding, vaginal discharge, or sick contacts.   OB History    Gravida Para Term Preterm AB Living   0 0 1   SAB TAB Ectopic Multiple Live Births   0 0 0 0 1      Past Medical History:  Diagnosis Date  . Bronchitis   . Hypertension    pih  . No pertinent past medical history   . Pre-eclampsia     Past Surgical History:  Procedure Laterality Date  . NO PAST SURGERIES      Family History  Problem Relation Age of Onset  . Diabetes Mother   . Hypertension Mother   . Hyperlipidemia Mother   . Gout Father     Social History  Substance Use Topics  . Smoking status: Former Games developer  . Smokeless tobacco: Former Neurosurgeon  . Alcohol use Yes     Comment: occ    Allergies: No Known Allergies  Prescriptions Prior to Admission  Medication Sig Dispense Refill Last Dose  . albuterol (PROVENTIL HFA;VENTOLIN HFA) 108 (90 Base) MCG/ACT inhaler Inhale 1-2 puffs into the lungs every 6 (six) hours as needed for wheezing or shortness of breath. 1 Inhaler 0 unknown  . Chlorphen-PE-Acetaminophen (SINUS CONGESTION/PAIN DAY/NGHT PO) Take 2 tablets by mouth every 6 (six) hours as needed (sinus/pain).   unknown  .  cyclobenzaprine (FLEXERIL) 10 MG tablet Take 1 tablet (10 mg total) by mouth 2 (two) times daily as needed for muscle spasms. (Patient not taking: Reported on 01/12/2016) 20 tablet 0 Completed Course at Unknown time  . diphenhydrAMINE (BENADRYL) 25 MG tablet Take 25 mg by mouth every 6 (six) hours as needed for itching, allergies or sleep.   Past Week at Unknown time  . Doxylamine-Pyridoxine 10-10 MG TBEC Take 1 tablet by mouth 3 times/day as needed-between meals & bedtime (nausea). 20 tablet 0   . ibuprofen (ADVIL,MOTRIN) 800 MG tablet Take 1 tablet (800 mg total) by mouth 3 (three) times daily. (Patient not taking: Reported on 01/12/2016) 21 tablet 0 Completed Course at Unknown time  . ketorolac (TORADOL) 10 MG tablet Take 1 tablet (10 mg total) by mouth every 6 (six) hours as needed. (Patient not taking: Reported on 01/12/2016) 20 tablet 0 Completed Course at Unknown time  . metroNIDAZOLE (FLAGYL) 500 MG tablet Take 1 tablet (500 mg total) by mouth 2 (two) times daily. One po bid x 7 days 14 tablet 0   . ondansetron (ZOFRAN) 4 MG tablet Take 1 tablet (4 mg total) by mouth every 8 (eight) hours as needed for nausea or vomiting. 12 tablet 0 unknown  . Prenatal Vit-Fe Fumarate-FA (PRENATAL COMPLETE) 14-0.4 MG TABS Take 1  tablet by mouth daily. 30 each 1   . Prenatal Vit-Fe Fumarate-FA (PRENATAL COMPLETE) 14-0.4 MG TABS Take 1 tablet by mouth daily. 60 each 5   . promethazine (PHENERGAN) 25 MG suppository Place 1 suppository (25 mg total) rectally every 8 (eight) hours as needed for refractory nausea / vomiting. 4 each 0 unknown  . ranitidine (ZANTAC) 150 MG tablet Take 1 tablet (150 mg total) by mouth 2 (two) times daily. 14 tablet 0     Review of Systems  Constitutional: Negative for chills and fever.  Gastrointestinal: Positive for diarrhea, nausea and vomiting. Negative for abdominal pain and blood in stool.  Genitourinary: Positive for flank pain. Negative for dysuria, frequency, hematuria, pelvic  pain, vaginal bleeding and vaginal discharge.   Physical Exam   Blood pressure (!) 122/51, pulse 71, temperature 98.4 F (36.9 C), temperature source Oral, resp. rate 18, last menstrual period 04/22/2016.  Physical Exam  Nursing note and vitals reviewed. Constitutional: She is oriented to person, place, and time. She appears well-developed and well-nourished. No distress.  HENT:  Head: Normocephalic and atraumatic.  Eyes: Conjunctivae are normal. Right eye exhibits no discharge. Left eye exhibits no discharge. No scleral icterus.  Neck: Normal range of motion.  Cardiovascular: Normal rate, regular rhythm and normal heart sounds.   No murmur heard. Respiratory: Effort normal and breath sounds normal. No respiratory distress. She has no wheezes.  GI: Soft. Bowel sounds are normal. There is no tenderness. There is no rigidity, no rebound, no guarding and no CVA tenderness.  Neurological: She is alert and oriented to person, place, and time.  Skin: Skin is warm and dry. She is not diaphoretic.  Psychiatric: She has a normal mood and affect. Her behavior is normal. Judgment and thought content normal.    MAU Course  Procedures Results for orders placed or performed during the hospital encounter of 06/16/16 (from the past 24 hour(s))  Urinalysis, Routine w reflex microscopic     Status: Abnormal   Collection Time: 06/16/16  8:40 AM  Result Value Ref Range   Color, Urine YELLOW YELLOW   APPearance HAZY (A) CLEAR   Specific Gravity, Urine 1.018 1.005 - 1.030   pH 7.0 5.0 - 8.0   Glucose, UA NEGATIVE NEGATIVE mg/dL   Hgb urine dipstick NEGATIVE NEGATIVE   Bilirubin Urine NEGATIVE NEGATIVE   Ketones, ur NEGATIVE NEGATIVE mg/dL   Protein, ur 30 (A) NEGATIVE mg/dL   Nitrite NEGATIVE NEGATIVE   Leukocytes, UA MODERATE (A) NEGATIVE   RBC / HPF 0-5 0 - 5 RBC/hpf   WBC, UA 0-5 0 - 5 WBC/hpf   Bacteria, UA NONE SEEN NONE SEEN   Squamous Epithelial / LPF 0-5 (A) NONE SEEN   Mucous PRESENT      MDM U/a -- moderate leuks, no hemoglobin -- will send for urine culture No urinary complaints, afebrile, & no CVAT No vomiting in MAU -- offered IM phenergan vs rx for phenergan -- pt will take rx home d/t driving self here Discussed dosing schedule for diclegis  Assessment and Plan  A; 1. Gastroenteritis, acute    P: Discharge home Rx phenergan Take diclegis as directed Discussed reasons to return to MAU Keep follow up appointment with OB/PCP  Urine culture pending   Judeth Horn 06/16/2016, 9:01 AM

## 2016-06-16 NOTE — Discharge Instructions (Signed)
How to take your diclegis:  Start with 2 tablets every evening. If symptoms persist, add 1 tablet every morning. If symptoms persist, add 1 tablet at mid-day.      Food Choices to Help Relieve Diarrhea, Adult When you have diarrhea, the foods you eat and your eating habits are very important. Choosing the right foods and drinks can help:  Relieve diarrhea.  Replace lost fluids and nutrients.  Prevent dehydration. What general guidelines should I follow? Relieving diarrhea   Choose foods with less than 2 g or .07 oz. of fiber per serving.  Limit fats to less than 8 tsp (38 g or 1.34 oz.) a day.  Avoid the following:  Foods and beverages sweetened with high-fructose corn syrup, honey, or sugar alcohols such as xylitol, sorbitol, and mannitol.  Foods that contain a lot of fat or sugar.  Fried, greasy, or spicy foods.  High-fiber grains, breads, and cereals.  Raw fruits and vegetables.  Eat foods that are rich in probiotics. These foods include dairy products such as yogurt and fermented milk products. They help increase healthy bacteria in the stomach and intestines (gastrointestinal tract, or GI tract).  If you have lactose intolerance, avoid dairy products. These may make your diarrhea worse.  Take medicine to help stop diarrhea (antidiarrheal medicine) only as told by your health care provider. Replacing nutrients   Eat small meals or snacks every 3-4 hours.  Eat bland foods, such as white rice, toast, or baked potato, until your diarrhea starts to get better. Gradually reintroduce nutrient-rich foods as tolerated or as told by your health care provider. This includes:  Well-cooked protein foods.  Peeled, seeded, and soft-cooked fruits and vegetables.  Low-fat dairy products.  Take vitamin and mineral supplements as told by your health care provider. Preventing dehydration    Start by sipping water or a special solution to prevent dehydration (oral rehydration  solution, ORS). Urine that is clear or pale yellow means that you are getting enough fluid.  Try to drink at least 8-10 cups of fluid each day to help replace lost fluids.  You may add other liquids in addition to water, such as clear juice or decaffeinated sports drinks, as tolerated or as told by your health care provider.  Avoid drinks with caffeine, such as coffee, tea, or soft drinks.  Avoid alcohol. What foods are recommended? The items listed may not be a complete list. Talk with your health care provider about what dietary choices are best for you. Grains  White rice. White, [place], or pita breads (fresh or toasted), including plain rolls, buns, or bagels. White pasta. Saltine, soda, or graham crackers. Pretzels. Low-fiber cereal. Cooked cereals made with water (such as cornmeal, farina, or cream cereals). Plain muffins. Matzo. Melba toast. Zwieback. Vegetables  Potatoes (without the skin). Most well-cooked and canned vegetables without skins or seeds. Tender lettuce. Fruits  Apple sauce. Fruits canned in juice. Cooked apricots, cherries, grapefruit, peaches, pears, or plums. Fresh bananas and cantaloupe. Meats and other protein foods  Baked or boiled chicken. Eggs. Tofu. Fish. Seafood. Smooth nut butters. Ground or well-cooked tender beef, ham, veal, lamb, pork, or poultry. Dairy  Plain yogurt, kefir, and unsweetened liquid yogurt. Lactose-free milk, buttermilk, skim milk, or soy milk. Low-fat or nonfat hard cheese. Beverages  Water. Low-calorie sports drinks. Fruit juices without pulp. Strained tomato and vegetable juices. Decaffeinated teas. Sugar-free beverages not sweetened with sugar alcohols. Oral rehydration solutions, if approved by your health care provider. Seasoning and other foods  Bouillon, broth, or soups made from recommended foods. What foods are not recommended? The items listed may not be a complete list. Talk with your health care provider about what dietary  choices are best for you. Grains  Whole grain, whole wheat, bran, or rye breads, rolls, pastas, and crackers. Wild or brown rice. Whole grain or bran cereals. Barley. Oats and oatmeal. Corn tortillas or taco shells. Granola. Popcorn. Vegetables  Raw vegetables. Fried vegetables. Cabbage, broccoli, Brussels sprouts, artichokes, baked beans, beet greens, corn, kale, legumes, peas, sweet potatoes, and yams. Potato skins. Cooked spinach and cabbage. Fruits  Dried fruit, including raisins and dates. Raw fruits. Stewed or dried prunes. Canned fruits with syrup. Meat and other protein foods  Fried or fatty meats. Deli meats. Chunky nut butters. Nuts and seeds. Beans and lentils. Tomasa Blase. Hot dogs. Sausage. Dairy  High-fat cheeses. Whole milk, chocolate milk, and beverages made with milk, such as milk shakes. Half-and-half. Cream. sour cream. Ice cream. Beverages  Caffeinated beverages (such as coffee, tea, soda, or energy drinks). Alcoholic beverages. Fruit juices with pulp. Prune juice. Soft drinks sweetened with high-fructose corn syrup or sugar alcohols. High-calorie sports drinks. Fats and oils  Butter. Cream sauces. Margarine. Salad oils. Plain salad dressings. Olives. Avocados. Mayonnaise. Sweets and desserts  Sweet rolls, doughnuts, and sweet breads. Sugar-free desserts sweetened with sugar alcohols such as xylitol and sorbitol. Seasoning and other foods  Honey. Hot sauce. Chili powder. Gravy. Cream-based or milk-based soups. Pancakes and waffles. Summary  When you have diarrhea, the foods you eat and your eating habits are very important.  Make sure you get at least 8-10 cups of fluid each day, or enough to keep your urine clear or pale yellow.  Eat bland foods and gradually reintroduce healthy, nutrient-rich foods as tolerated, or as told by your health care provider.  Avoid high-fiber, fried, greasy, or spicy foods. This information is not intended to replace advice given to you by your  health care provider. Make sure you discuss any questions you have with your health care provider. Document Released: 05/14/2003 Document Revised: 02/19/2016 Document Reviewed: 02/19/2016 Elsevier Interactive Patient Education  2017 [employer].

## 2016-06-16 NOTE — MAU Note (Signed)
c/o of L flank pain since 0100 this AM and diarrhea @ 0600 this Am; has been having N&V since she founf out she was pregnant;

## 2016-06-17 LAB — CULTURE, OB URINE

## 2016-07-06 ENCOUNTER — Encounter: Payer: Self-pay | Admitting: Obstetrics

## 2016-07-06 ENCOUNTER — Other Ambulatory Visit (HOSPITAL_COMMUNITY)
Admission: RE | Admit: 2016-07-06 | Discharge: 2016-07-06 | Disposition: A | Discharge status: Home / Self Care | Payer: Medicaid Other | Source: Ambulatory Visit | Attending: Obstetrics | Admitting: Obstetrics

## 2016-07-06 ENCOUNTER — Ambulatory Visit (INDEPENDENT_AMBULATORY_CARE_PROVIDER_SITE_OTHER): Payer: Medicaid Other | Admitting: Obstetrics

## 2016-07-06 VITALS — BP 117/70 | HR 89 | Wt 302.2 lb

## 2016-07-06 DIAGNOSIS — Z3481 Encounter for supervision of other normal pregnancy, first trimester: Secondary | ICD-10-CM

## 2016-07-06 DIAGNOSIS — Z3400 Encounter for supervision of normal first pregnancy, unspecified trimester: Secondary | ICD-10-CM

## 2016-07-06 DIAGNOSIS — O99211 Obesity complicating pregnancy, first trimester: Secondary | ICD-10-CM

## 2016-07-06 DIAGNOSIS — Z3490 Encounter for supervision of normal pregnancy, unspecified, unspecified trimester: Secondary | ICD-10-CM | POA: Insufficient documentation

## 2016-07-06 DIAGNOSIS — Z3480 Encounter for supervision of other normal pregnancy, unspecified trimester: Secondary | ICD-10-CM | POA: Insufficient documentation

## 2016-07-06 NOTE — Progress Notes (Signed)
Patient is in the office for initial ob visit, patient states that she feels slight left sided pain but overall feels good today.

## 2016-07-06 NOTE — Progress Notes (Addendum)
Subjective:    Lisa Charles is being seen today for her first obstetrical visit.  This is a planned pregnancy. She is at [redacted]w[redacted]d gestation. Her obstetrical history is significant for obesity. Relationship with FOB: significant other, not living together. Patient does intend to breast feed. Pregnancy history fully reviewed.  The information documented in the HPI was reviewed and verified.  Menstrual History: OB History    Gravida Para Term Preterm AB Living   0 0 1   SAB TAB Ectopic Multiple Live Births   0 0 0 0 1      Patient's last menstrual period was 04/22/2016.    Past Medical History:  Diagnosis Date  . Bronchitis   . Pre-eclampsia     Past Surgical History:  Procedure Laterality Date  . NO PAST SURGERIES       (Not in a hospital admission) No Known Allergies  Social History  Substance Use Topics  . Smoking status: Former Smoker    Quit date: 02/2016  . Smokeless tobacco: Former Neurosurgeon  . Alcohol use No    Family History  Problem Relation Age of Onset  . Diabetes Mother   . Hypertension Mother   . Hyperlipidemia Mother   . Gout Father      Review of Systems Constitutional: negative for weight loss Gastrointestinal: negative for vomiting Genitourinary:negative for genital lesions and vaginal discharge and dysuria Musculoskeletal:negative for back pain Behavioral/Psych: negative for abusive relationship, depression, illegal drug usage and tobacco use    Objective:    BP 117/70   Pulse 89   Wt (!) 302 lb 3.2 oz (137.1 kg)   LMP 04/22/2016   BMI 43.36 kg/m  General Appearance:    Alert, cooperative, no distress, appears stated age  Head:    Normocephalic, without obvious abnormality, atraumatic  Eyes:    PERRL, conjunctiva/corneas clear, EOM's intact, fundi    benign, both eyes  Ears:    Normal TM's and external ear canals, both ears  Nose:   Nares normal, septum midline, mucosa normal, no drainage    or sinus tenderness  Throat:   Lips, mucosa,  and tongue normal; teeth and gums normal  Neck:   Supple, symmetrical, trachea midline, no adenopathy;    thyroid:  no enlargement/tenderness/nodules; no carotid   bruit or JVD  Back:     Symmetric, no curvature, ROM normal, no CVA tenderness  Lungs:     Clear to auscultation bilaterally, respirations unlabored  Chest Wall:    No tenderness or deformity   Heart:    Regular rate and rhythm, S1 and S2 normal, no murmur, rub   or gallop  Breast Exam:    No tenderness, masses, or nipple abnormality  Abdomen:     Soft, non-tender, bowel sounds active all four quadrants,    no masses, no organomegaly  Genitalia:    Normal female without lesion, discharge or tenderness  Extremities:   Extremities normal, atraumatic, no cyanosis or edema  Pulses:   2+ and symmetric all extremities  Skin:   Skin color, texture, turgor normal, no rashes or lesions  Lymph nodes:   Cervical, supraclavicular, and axillary nodes normal  Neurologic:   CNII-XII intact, normal strength, sensation and reflexes    throughout      Lab Review Urine pregnancy test Labs reviewed yes Radiologic studies reviewed yes      US OB Transvaginal (Accession [id number]) (Order [id number])  Imaging  Date: 06/08/2016 Department: [facility]-EMERGENCY  DEPT Released By/Authorizing: Trixie Dredge, PA-C (auto-released)  Exam Information   Status Exam Begun  Exam Ended   Final [99] 06/08/2016 8:15 PM 06/08/2016 8:50 PM  PACS Images   Show images for US OB Transvaginal  Study Result   CLINICAL DATA:  Abdominal pain for 3 weeks  EXAM: OBSTETRIC <14 WK [place] AND TRANSVAGINAL OB US  TECHNIQUE: Both transabdominal and transvaginal ultrasound examinations were performed for complete evaluation of the gestation as well as the maternal uterus, adnexal regions, and pelvic cul-de-sac. Transvaginal technique was performed to assess early pregnancy.  COMPARISON:  None.  FINDINGS: Intrauterine gestational sac: Single  intrauterine gestation  Yolk sac:  Visualized  Embryo:  Visualized  Cardiac Activity: Visualized  Heart Rate: 121  bpm  CRL:  4.7  mm   6 w   1 d                  [place] EDC: 02/01/2017  Subchorionic hemorrhage:  None visualized.  Maternal uterus/adnexae: Bilateral ovaries grossly within normal limits. Right ovary measures 2.8 x 1.7 x 1.6 cm. Left ovary is visualized on the trans abdominal images and measures 3.1 x 2.8 by 2.9 cm. No significant free fluid.  IMPRESSION: Single viable intrauterine gestation as above.   Electronically Signed   By: Jasmine Pang M.D.   On: 06/08/2016 21:01   Vitals   Height Weight BMI (Calculated)   (1.778 m) 314 lb (142.4 kg) 45.1  External Result Report   External Result Report  Imaging   Imaging Information  Signed by   Signed Date/Time  Phone Pager  Adrian Prows 06/08/2016 9:01 PM [phone]   Signed   Electronically signed by Adrian Prows, MD on 06/08/16 at 2101 EDT  Original Order   Ordered On Ordered By   06/08/2016 6:47 PM Trixie Dredge, PA-C            Assessment:    Pregnancy at [redacted]w[redacted]d weeks    Plan:     1. Supervision of normal first pregnancy, antepartum Rx: - Cytology - PAP - Obstetric Panel, Including HIV - Vitamin D (25 hydroxy) - Varicella zoster antibody, IgG - Culture, OB Urine - Cervicovaginal ancillary only - Hemoglobinopathy evaluationRx:  Prenatal vitamins.  Counseling provided regarding continued use of seat belts, cessation of alcohol consumption, smoking or use of illicit drugs; infection precautions i.e., influenza/TDAP immunizations, toxoplasmosis,CMV, parvovirus, listeria and varicella; workplace safety, exercise during pregnancy; routine dental care, safe medications, sexual activity, hot tubs, saunas, pools, travel, caffeine use, fish and methlymercury, potential toxins, hair treatments, varicose veins Weight gain recommendations per IOM guidelines reviewed: underweight/BMI<  18.5--> gain 28 - 40 lbs; normal weight/BMI 18.5 - 24.9--> gain 25 - 35 lbs; overweight/BMI 25 - 29.9--> gain 15 - 25 lbs; obese/BMI >30->gain  11 - 20 lbs Problem list reviewed and updated. FIRST/CF mutation testing/NIPT/QUAD SCREEN/fragile X/Ashkenazi Jewish population testing/Spinal muscular atrophy discussed: requested. Role of ultrasound in pregnancy discussed; fetal survey: requested. Amniocentesis discussed: not indicated. VBAC calculator score: VBAC consent form provided Meds ordered this encounter  Medications  . Prenatal Vit-Fe Fumarate-FA (MULTIVITAMIN-PRENATAL) 27-0.8 MG TABS tablet    Sig: Take 1 tablet by mouth daily at 12 noon.   Orders Placed This Encounter  Procedures  . Culture, OB Urine  . Obstetric Panel, Including HIV  . Vitamin D (25 hydroxy)  . Varicella zoster antibody, IgG  . Hemoglobinopathy evaluation    Follow up in 5 weeks. 50% of 20 min visit spent on  counseling and coordination of care. Patient ID: Lisa Charles, female   DOB: Dec 25, 1985, 31 y.o.   MRN: [id number]

## 2016-07-07 LAB — CERVICOVAGINAL ANCILLARY ONLY
BACTERIAL VAGINITIS: POSITIVE — AB
CANDIDA VAGINITIS: NEGATIVE
CHLAMYDIA, DNA PROBE: NEGATIVE
Neisseria Gonorrhea: NEGATIVE
Trichomonas: NEGATIVE

## 2016-07-08 ENCOUNTER — Other Ambulatory Visit: Payer: Self-pay

## 2016-07-08 ENCOUNTER — Other Ambulatory Visit: Payer: Self-pay | Admitting: Obstetrics

## 2016-07-08 DIAGNOSIS — B9689 Other specified bacterial agents as the cause of diseases classified elsewhere: Secondary | ICD-10-CM

## 2016-07-08 DIAGNOSIS — N760 Acute vaginitis: Principal | ICD-10-CM

## 2016-07-08 LAB — OBSTETRIC PANEL, INCLUDING HIV
ANTIBODY SCREEN: NEGATIVE
BASOS: 0 %
Basophils Absolute: 0 10*3/uL (ref 0.0–0.2)
EOS (ABSOLUTE): 0.1 10*3/uL (ref 0.0–0.4)
Eos: 1 %
HEMATOCRIT: 34.8 % (ref 34.0–46.6)
HEP B S AG: NEGATIVE
HIV SCREEN 4TH GENERATION: NONREACTIVE
Hemoglobin: 11.6 g/dL (ref 11.1–15.9)
Immature Grans (Abs): 0 10*3/uL (ref 0.0–0.1)
Immature Granulocytes: 0 %
Lymphocytes Absolute: 2.3 10*3/uL (ref 0.7–3.1)
Lymphs: 30 %
MCH: 26.9 pg (ref 26.6–33.0)
MCHC: 33.3 g/dL (ref 31.5–35.7)
MCV: 81 fL (ref 79–97)
MONOCYTES: 6 %
Monocytes Absolute: 0.5 10*3/uL (ref 0.1–0.9)
NEUTROS ABS: 4.9 10*3/uL (ref 1.4–7.0)
Neutrophils: 63 %
Platelets: 332 10*3/uL (ref 150–379)
RBC: 4.31 x10E6/uL (ref 3.77–5.28)
RDW: 17.2 % — ABNORMAL HIGH (ref 12.3–15.4)
RPR: NONREACTIVE
RUBELLA: 7.94 {index} (ref >0.99)
Rh Factor: POSITIVE
WBC: 7.9 10*3/uL (ref 3.4–10.8)

## 2016-07-08 LAB — HEMOGLOBINOPATHY EVALUATION
HGB C: 0 %
HGB S: 0 %
HGB VARIANT: 0 %
Hemoglobin A2 Quantitation: 2 % (ref 1.8–3.2)
Hemoglobin F Quantitation: 0 % (ref 0.0–2.0)
Hgb A: 98 % (ref 96.4–98.8)

## 2016-07-08 LAB — VITAMIN D 25 HYDROXY (VIT D DEFICIENCY, FRACTURES): VIT D 25 HYDROXY: 19.2 ng/mL — AB (ref 30.0–100.0)

## 2016-07-08 LAB — VARICELLA ZOSTER ANTIBODY, IGG: Varicella zoster IgG: 3809 index (ref >165)

## 2016-07-08 MED ORDER — METRONIDAZOLE 500 MG PO TABS: 500.0000 mg | ORAL_TABLET | Freq: Two times a day (BID) | ORAL | 2 refills | Status: DC

## 2016-07-09 ENCOUNTER — Encounter (HOSPITAL_COMMUNITY): Payer: Self-pay | Admitting: Emergency Medicine

## 2016-07-09 ENCOUNTER — Emergency Department (HOSPITAL_COMMUNITY)
Admission: EM | Admit: 2016-07-09 | Discharge: 2016-07-09 | Disposition: A | Discharge status: Home / Self Care | Payer: Medicaid Other | Attending: Emergency Medicine | Admitting: Emergency Medicine

## 2016-07-09 DIAGNOSIS — J40 Bronchitis, not specified as acute or chronic: Secondary | ICD-10-CM | POA: Diagnosis not present

## 2016-07-09 DIAGNOSIS — Z79899 Other long term (current) drug therapy: Secondary | ICD-10-CM | POA: Diagnosis not present

## 2016-07-09 DIAGNOSIS — Z3A10 10 weeks gestation of pregnancy: Secondary | ICD-10-CM | POA: Diagnosis not present

## 2016-07-09 DIAGNOSIS — Z87891 Personal history of nicotine dependence: Secondary | ICD-10-CM | POA: Insufficient documentation

## 2016-07-09 DIAGNOSIS — O99511 Diseases of the respiratory system complicating pregnancy, first trimester: Secondary | ICD-10-CM | POA: Diagnosis present

## 2016-07-09 DIAGNOSIS — Z3491 Encounter for supervision of normal pregnancy, unspecified, first trimester: Secondary | ICD-10-CM

## 2016-07-09 MED ORDER — LORATADINE 10 MG PO TABS: 10.0000 mg | ORAL_TABLET | Freq: Every day | ORAL | 0 refills | Status: DC

## 2016-07-09 MED ORDER — AZITHROMYCIN 250 MG PO TABS: 250.0000 mg | ORAL_TABLET | Freq: Every day | ORAL | 0 refills | Status: DC

## 2016-07-09 NOTE — ED Triage Notes (Signed)
Pt presents with sinus congestion, headache, prod cough x 3 days; denies fevers; states cannot take certain things because she is pregnant

## 2016-07-09 NOTE — ED Provider Notes (Signed)
[facility]-EMERGENCY DEPT Provider Note   CSN: [id number] Arrival date & time: 07/09/16  2127  By signing my name below, I, Majel HomerPeyton Lee, attest that this documentation has been prepared under the direction and in the presence of non-physician practitioner, Kerrie BuffaloHope Neese, NP. Electronically Signed: Majel HomerPeyton Lee, Scribe. 07/09/2016. 9:55 PM.  History   Chief Complaint Chief Complaint  Patient presents with  . Nasal Congestion  . Headache  . Cough   The history is provided by the patient. No language interpreter was used.  Cough  This is a new problem. The current episode started more than 2 days ago. The problem has been gradually worsening. The cough is non-productive. There has been no fever. Associated symptoms include headaches and sore throat. Pertinent negatives include no chest pain, no chills, no ear congestion and no ear pain. She has tried decongestants for the symptoms. The treatment provided no relief. Her past medical history is significant for bronchitis.   HPI Comments: Lisa Charles is a 31 y.o. female with PMHx of bronchitis, who presents to the Emergency Department complaining of gradually worsening, cough and nasal congestion that began 4 days ago. Pt reports associated sinus pressure, sore throat, postnasal drip, and frontal headache that she attributes to her sinus pressure. She states her daughter at home was recently been sick and believes this is how she contracted her symptoms. She notes she has taken Benadryl today with no relief of her symptoms. Pt reports she is [redacted] weeks pregnant and experiences intermittent nausea, vomiting, and diarrhea due to her pregnancy. She denies any abdominal pain, abnormal vaginal bleeding, fever, chills, blurry vision, or other visual disturbances.    Past Medical History:  Diagnosis Date  . Bronchitis   . Pre-eclampsia     Patient Active Problem List   Diagnosis Date Noted  . Supervision of normal first pregnancy, antepartum 07/06/2016  .  Preeclampsia 02/20/2012  . Active labor 02/20/2012  . Normal delivery 02/20/2012    Past Surgical History:  Procedure Laterality Date  . NO PAST SURGERIES      OB History    Gravida Para Term Preterm AB Living   2 1 1  0 0 1   SAB TAB Ectopic Multiple Live Births   0 0 0 0 1     Home Medications    Prior to Admission medications   Medication Sig Start Date End Date Taking? Authorizing Provider  albuterol (PROVENTIL HFA;VENTOLIN HFA) 108 (90 Base) MCG/ACT inhaler Inhale 1-2 puffs into the lungs every 6 (six) hours as needed for wheezing or shortness of breath. Patient not taking: Reported on 07/06/2016 01/12/16   Shaune PollackIsaacs, Cameron, MD  azithromycin (ZITHROMAX) 250 MG tablet Take 1 tablet (250 mg total) by mouth daily. Take first 2 tablets together, then 1 every day until finished. 07/09/16   Janne NapoleonNeese, Hope M, NP  loratadine (CLARITIN) 10 MG tablet Take 1 tablet (10 mg total) by mouth daily. 07/09/16   Janne NapoleonNeese, Hope M, NP  metroNIDAZOLE (FLAGYL) 500 MG tablet Take 1 tablet (500 mg total) by mouth 2 (two) times daily. 07/08/16   Brock BadHarper, [name], MD  Prenatal Vit-Fe Fumarate-FA (MULTIVITAMIN-PRENATAL) 27-0.8 MG TABS tablet Take 1 tablet by mouth daily at 12 noon.    [provider]  promethazine (PHENERGAN) 25 MG tablet Take 1 tablet (25 mg total) by mouth every 6 (six) hours as needed for nausea or vomiting. Patient not taking: Reported on 07/06/2016 06/16/16   Judeth HornLawrence, Erin, NP    Family History Family History  Problem Relation Age of Onset  . Diabetes Mother   . Hypertension Mother   . Hyperlipidemia Mother   . Gout Father     Social History Social History  Substance Use Topics  . Smoking status: Former Smoker    Quit date: 02/2016  . Smokeless tobacco: Former Neurosurgeon  . Alcohol use No   Allergies   Patient has no known allergies.  Review of Systems Review of Systems  Constitutional: Negative for chills.  HENT: Positive for congestion, postnasal drip, sinus pressure and sore  throat. Negative for ear pain.   Eyes: Negative for visual disturbance.  Respiratory: Positive for cough.   Cardiovascular: Negative for chest pain.  Gastrointestinal: Positive for diarrhea, nausea and vomiting. Negative for abdominal pain.  Genitourinary: Negative for vaginal bleeding, vaginal discharge and vaginal pain.       [redacted] weeks pregnant  Musculoskeletal: Negative for back pain.  Skin: Negative for wound.  Neurological: Positive for headaches.  Hematological: Negative for adenopathy.  Psychiatric/Behavioral: The patient is not nervous/anxious.    Physical Exam Updated Vital Signs BP 125/62 (BP Location: Left Arm)   Pulse 100   Temp 98.2 F (36.8 C) (Oral)   Resp 18   LMP 04/22/2016   SpO2 99%   Physical Exam  Constitutional: She is oriented to person, place, and time. She appears well-developed and well-nourished. No distress.  HENT:  Head: Normocephalic.  Right Ear: Tympanic membrane normal.  Left Ear: Tympanic membrane normal.  Nose: Rhinorrhea present.  Mouth/Throat: Uvula is midline and mucous membranes are normal. Posterior oropharyngeal erythema present. No oropharyngeal exudate or posterior oropharyngeal edema.  Uvula midline. Mild erythema, no edema. Some maxillary sinus tenderness.   Eyes: EOM are normal.  Neck: Normal range of motion.  Cardiovascular: Normal rate and regular rhythm.   Pulmonary/Chest: Effort normal.  Lungs clear to auscultation bilaterally   Abdominal: Soft. Bowel sounds are normal. She exhibits no distension. There is no tenderness.  Musculoskeletal: Normal range of motion.  Lymphadenopathy:    She has no cervical adenopathy.  Neurological: She is alert and oriented to person, place, and time.  Psychiatric: She has a normal mood and affect.  Nursing note and vitals reviewed.  ED Treatments / Results  DIAGNOSTIC STUDIES:  Oxygen Saturation is 99% on RA, normal by my interpretation.    COORDINATION OF CARE:  9:53 PM Discussed  treatment plan with pt at bedside and pt agreed to plan.  Labs (all labs ordered are listed, but only abnormal results are displayed) Labs Reviewed - No data to display Radiology No results found.  Procedures Procedures (including critical care time)  Medications Ordered in ED Medications - No data to display  Initial Impression / Assessment and Plan / ED Course  I have reviewed the triage vital signs and the nursing notes.  Pt symptoms consistent with URI. Pt will be discharged with symptomatic treatment.  Discussed return precautions.  Pt is hemodynamically stable & in NAD prior to discharge. I personally performed the services described in this documentation, which was scribed in my presence. The recorded information has been reviewed and is accurate.   Final Clinical Impressions(s) / ED Diagnoses  31 y.o. female with productive cough and congestion @ [redacted] weeks gestation stable for d/c without fever and does not appear toxic. Will treat for bronchitis and patient will f/u with her provider or return for worsening symptoms   Final diagnoses:  Bronchitis  First trimester pregnancy    New Prescriptions Discharge Medication List  as of 07/09/2016 10:01 PM    START taking these medications   Details  azithromycin (ZITHROMAX) 250 MG tablet Take 1 tablet (250 mg total) by mouth daily. Take first 2 tablets together, then 1 every day until finished., Starting Sat 07/09/2016, Print    loratadine (CLARITIN) 10 MG tablet Take 1 tablet (10 mg total) by mouth daily., Starting Sat 07/09/2016, Print         [place], [place], [place] 07/10/16 Ander Gaster    Benjiman Core, MD 07/10/16 2228

## 2016-07-11 LAB — CYTOLOGY - PAP
DIAGNOSIS: NEGATIVE
HPV (WINDOPATH): NOT DETECTED

## 2016-07-13 ENCOUNTER — Other Ambulatory Visit: Payer: Self-pay | Admitting: Obstetrics

## 2016-07-13 DIAGNOSIS — O2340 Unspecified infection of urinary tract in pregnancy, unspecified trimester: Principal | ICD-10-CM

## 2016-07-13 DIAGNOSIS — B955 Unspecified streptococcus as the cause of diseases classified elsewhere: Secondary | ICD-10-CM

## 2016-07-13 LAB — URINE CULTURE, OB REFLEX

## 2016-07-13 LAB — CULTURE, OB URINE

## 2016-07-13 MED ORDER — AMOXICILLIN-POT CLAVULANATE 875-125 MG PO TABS: 1.0000 | ORAL_TABLET | Freq: Two times a day (BID) | ORAL | 0 refills | Status: DC

## 2016-07-16 ENCOUNTER — Encounter: Payer: Self-pay | Admitting: Obstetrics & Gynecology

## 2016-07-18 ENCOUNTER — Telehealth: Payer: Self-pay | Admitting: *Deleted

## 2016-07-18 ENCOUNTER — Other Ambulatory Visit: Payer: Self-pay | Admitting: *Deleted

## 2016-07-18 MED ORDER — ASPIRIN 81 MG PO TABS: 81.0000 mg | ORAL_TABLET | Freq: Every day | ORAL | 11 refills | Status: DC

## 2016-07-18 NOTE — Progress Notes (Signed)
Aspirin 81mg  sent to pharmacy per Dr Macon LargeAnyanwu request.

## 2016-07-18 NOTE — Telephone Encounter (Signed)
-----   Message from Pennie BanterMarni W Smith sent at 07/18/2016  9:16 AM EDT ----- Regarding: FW: [facility] chart review/ audit   ----- Message ----- From: Tereso NewcomerAnyanwu, Ugonna A, MD Sent: 07/16/2016   6:06 PM To: Pennie BanterMarni W Smith Subject: RE: [place] [facility] chart review/ audit             Needs to start Aspirin 81 mg daily at 12 weeks, please have RN call it in for patient given history of preeclampsia.  Otherwise, done!  UAA ----- Message ----- From: Pennie BanterSmith, Marni W Sent: 07/13/2016   8:52 AM To: Tereso NewcomerUgonna A Anyanwu, MD Subject: [place] [facility] chart review/ audit

## 2016-07-18 NOTE — Telephone Encounter (Signed)
Pt made aware of recommendations to start on low dose Aspirin at 12 weeks. Rx has been sent to pharmacy. Pt states understanding.

## 2016-08-03 ENCOUNTER — Encounter: Payer: Self-pay | Admitting: Obstetrics

## 2016-08-03 ENCOUNTER — Ambulatory Visit (INDEPENDENT_AMBULATORY_CARE_PROVIDER_SITE_OTHER): Payer: Medicaid Other | Admitting: Obstetrics

## 2016-08-03 VITALS — BP 136/75 | HR 91 | Wt 303.0 lb

## 2016-08-03 DIAGNOSIS — Z3689 Encounter for other specified antenatal screening: Secondary | ICD-10-CM

## 2016-08-03 NOTE — Progress Notes (Signed)
ROB c/o pain in right hand x 2 wks. Pt declines Quad screen.

## 2016-08-03 NOTE — Progress Notes (Signed)
Subjective:  Lisa Charles is a 31 y.o. G2P1001 at 4426w1d being seen today for ongoing prenatal care.  She is currently monitored for the following issues for this low-risk pregnancy and has Hx of preeclampsia, prior pregnancy, currently pregnant and Encounter for supervision of normal pregnancy, antepartum on her problem list.  Patient reports no complaints.  Contractions: Not present. Vag. Bleeding: None.   . Denies leaking of fluid.   The following portions of the patient's history were reviewed and updated as appropriate: allergies, current medications, past family history, past medical history, past social history, past surgical history and problem list. Problem list updated.  Objective:   Vitals:   08/03/16 0851  BP: 136/75  Pulse: 91  Weight: (!) 303 lb (137.4 kg)    Fetal Status: Fetal Heart Rate (bpm): 150         General:  Alert, oriented and cooperative. Patient is in no acute distress.  Skin: Skin is warm and dry. No rash noted.   Cardiovascular: Normal heart rate noted  Respiratory: Normal respiratory effort, no problems with respiration noted  Abdomen: Soft, gravid, appropriate for gestational age. Pain/Pressure: Present     Pelvic:  Cervical exam deferred        Extremities: Normal range of motion.  Edema: None  Mental Status: Normal mood and affect. Normal behavior. Normal judgment and thought content.   Urinalysis:      Assessment and Plan:  Pregnancy: G2P1001 at 3026w1d  There are no diagnoses linked to this encounter. Preterm labor symptoms and general obstetric precautions including but not limited to vaginal bleeding, contractions, leaking of fluid and fetal movement were reviewed in detail with the patient. Please refer to After Visit Summary for other counseling recommendations.  Return in about 4 weeks (around 08/31/2016) for ROB.   Brock BadHarper, [name], MDPatient ID: Lisa ChafeKendra D [place]field, female   DOB: 06/19/1985, 31 y.o.   MRN: [id number]

## 2016-08-31 ENCOUNTER — Ambulatory Visit (INDEPENDENT_AMBULATORY_CARE_PROVIDER_SITE_OTHER): Payer: Medicaid Other | Admitting: Obstetrics

## 2016-08-31 ENCOUNTER — Encounter: Payer: Self-pay | Admitting: Obstetrics

## 2016-08-31 VITALS — BP 139/82 | HR 79 | Wt 301.0 lb

## 2016-08-31 DIAGNOSIS — Z3480 Encounter for supervision of other normal pregnancy, unspecified trimester: Secondary | ICD-10-CM

## 2016-08-31 DIAGNOSIS — Z3482 Encounter for supervision of other normal pregnancy, second trimester: Secondary | ICD-10-CM

## 2016-08-31 NOTE — Progress Notes (Signed)
Subjective:  Lisa Charles is a 31 y.o. G2P1001 at 2128w1d being seen today for ongoing prenatal care.  She is currently monitored for the following issues for this low-risk pregnancy and has Hx of preeclampsia, prior pregnancy, currently pregnant and Encounter for supervision of normal pregnancy, antepartum on her problem list.  Patient reports no complaints.  Contractions: Not present. Vag. Bleeding: None.  Movement: Present. Denies leaking of fluid.   The following portions of the patient's history were reviewed and updated as appropriate: allergies, current medications, past family history, past medical history, past social history, past surgical history and problem list. Problem list updated.  Objective:   Vitals:   08/31/16 0856  BP: 139/82  Pulse: 79  Weight: (!) 301 lb (136.5 kg)    Fetal Status: Fetal Heart Rate (bpm): 150   Movement: Present     General:  Alert, oriented and cooperative. Patient is in no acute distress.  Skin: Skin is warm and dry. No rash noted.   Cardiovascular: Normal heart rate noted  Respiratory: Normal respiratory effort, no problems with respiration noted  Abdomen: Soft, gravid, appropriate for gestational age. Pain/Pressure: Absent     Pelvic:  Cervical exam deferred        Extremities: Normal range of motion.     Mental Status: Normal mood and affect. Normal behavior. Normal judgment and thought content.   Urinalysis:      Assessment and Plan:  Pregnancy: G2P1001 at 6428w1d  1. Supervision of other normal pregnancy, antepartum Rx: - US MFM OB COMP + 14 WK; Future  2. Morbid obesity (HCC)   Preterm labor symptoms and general obstetric precautions including but not limited to vaginal bleeding, contractions, leaking of fluid and fetal movement were reviewed in detail with the patient. Please refer to After Visit Summary for other counseling recommendations.  Return in about 4 weeks (around 09/28/2016) for ROB.   Brock BadHarper, [name], MD

## 2016-09-05 ENCOUNTER — Ambulatory Visit (HOSPITAL_COMMUNITY)
Admission: RE | Admit: 2016-09-05 | Discharge: 2016-09-05 | Disposition: A | Discharge status: Home / Self Care | Payer: Medicaid Other | Source: Ambulatory Visit | Attending: Obstetrics | Admitting: Obstetrics

## 2016-09-05 ENCOUNTER — Other Ambulatory Visit: Payer: Self-pay | Admitting: Obstetrics

## 2016-09-05 DIAGNOSIS — O99212 Obesity complicating pregnancy, second trimester: Secondary | ICD-10-CM

## 2016-09-05 DIAGNOSIS — O09299 Supervision of pregnancy with other poor reproductive or obstetric history, unspecified trimester: Secondary | ICD-10-CM

## 2016-09-05 DIAGNOSIS — Z3480 Encounter for supervision of other normal pregnancy, unspecified trimester: Secondary | ICD-10-CM

## 2016-09-05 DIAGNOSIS — Z3689 Encounter for other specified antenatal screening: Secondary | ICD-10-CM | POA: Diagnosis present

## 2016-09-05 DIAGNOSIS — E669 Obesity, unspecified: Secondary | ICD-10-CM | POA: Diagnosis not present

## 2016-09-05 DIAGNOSIS — Z3A18 18 weeks gestation of pregnancy: Secondary | ICD-10-CM | POA: Diagnosis not present

## 2016-09-05 DIAGNOSIS — O09292 Supervision of pregnancy with other poor reproductive or obstetric history, second trimester: Secondary | ICD-10-CM | POA: Diagnosis not present

## 2016-09-05 DIAGNOSIS — Z6841 Body Mass Index (BMI) 40.0 and over, adult: Secondary | ICD-10-CM | POA: Insufficient documentation

## 2016-09-19 ENCOUNTER — Encounter (HOSPITAL_COMMUNITY): Payer: Self-pay

## 2016-09-19 ENCOUNTER — Inpatient Hospital Stay (HOSPITAL_COMMUNITY)
Admission: AD | Admit: 2016-09-19 | Discharge: 2016-09-19 | Disposition: A | Discharge status: Home / Self Care | Payer: Medicaid Other | Source: Ambulatory Visit | Attending: Obstetrics & Gynecology | Admitting: Obstetrics & Gynecology

## 2016-09-19 DIAGNOSIS — Z87891 Personal history of nicotine dependence: Secondary | ICD-10-CM | POA: Diagnosis not present

## 2016-09-19 DIAGNOSIS — B3731 Acute candidiasis of vulva and vagina: Secondary | ICD-10-CM

## 2016-09-19 DIAGNOSIS — O4692 Antepartum hemorrhage, unspecified, second trimester: Secondary | ICD-10-CM | POA: Insufficient documentation

## 2016-09-19 DIAGNOSIS — O09299 Supervision of pregnancy with other poor reproductive or obstetric history, unspecified trimester: Secondary | ICD-10-CM

## 2016-09-19 DIAGNOSIS — Z7982 Long term (current) use of aspirin: Secondary | ICD-10-CM | POA: Diagnosis not present

## 2016-09-19 DIAGNOSIS — B373 Candidiasis of vulva and vagina: Secondary | ICD-10-CM | POA: Insufficient documentation

## 2016-09-19 DIAGNOSIS — O98812 Other maternal infectious and parasitic diseases complicating pregnancy, second trimester: Secondary | ICD-10-CM | POA: Insufficient documentation

## 2016-09-19 DIAGNOSIS — Z3A20 20 weeks gestation of pregnancy: Secondary | ICD-10-CM | POA: Diagnosis not present

## 2016-09-19 LAB — URINALYSIS, ROUTINE W REFLEX MICROSCOPIC
Bilirubin Urine: NEGATIVE
Glucose, UA: NEGATIVE mg/dL
KETONES UR: NEGATIVE mg/dL
Nitrite: NEGATIVE
PH: 6 (ref 5.0–8.0)
Protein, ur: NEGATIVE mg/dL
SPECIFIC GRAVITY, URINE: 1.01 (ref 1.005–1.030)

## 2016-09-19 LAB — WET PREP, GENITAL
CLUE CELLS WET PREP: NONE SEEN
SPERM: NONE SEEN
TRICH WET PREP: NONE SEEN

## 2016-09-19 MED ORDER — TERCONAZOLE 0.8 % VA CREA: 1.0000 | TOPICAL_CREAM | Freq: Every day | VAGINAL | 0 refills | Status: DC

## 2016-09-19 NOTE — MAU Note (Signed)
Since yesterday, vaginal spotting only when wipes.  Bright red. No leaking. Slight pressure like baby pushing down in lower abd on and off x 1 month.

## 2016-09-19 NOTE — Discharge Instructions (Signed)

## 2016-09-19 NOTE — MAU Provider Note (Signed)
History     CSN: [id number]  Arrival date and time: 09/19/16 7829   First Provider Initiated Contact with Patient 09/19/16 2043     Chief Complaint  Patient presents with  . Vaginal Bleeding   HPI Lisa Charles is a 31 y.o. G2P1001 at [redacted]w[redacted]d who presents with vaginal bleeding. She states she noticed pink spotting when she wiped that started yesterday and has continued today. No recent intercourse. She denies any pain but reports pressure in her pelvis. Denies abnormal discharge or leaking of fluid. Reports +fetal movement. She denies any dysuria or frequency of urination.   OB History    Gravida Para Term Preterm AB Living   2 1 1  0 0 1   SAB TAB Ectopic Multiple Live Births   0 0 0 0 1      Past Medical History:  Diagnosis Date  . Bronchitis   . Pre-eclampsia     Past Surgical History:  Procedure Laterality Date  . NO PAST SURGERIES      Family History  Problem Relation Age of Onset  . Diabetes Mother   . Hypertension Mother   . Hyperlipidemia Mother   . Gout Father     Social History  Substance Use Topics  . Smoking status: Former Smoker    Quit date: 02/2016  . Smokeless tobacco: Former Neurosurgeon  . Alcohol use No    Allergies: No Known Allergies  Prescriptions Prior to Admission  Medication Sig Dispense Refill Last Dose  . aspirin 81 MG tablet Take 1 tablet (81 mg total) by mouth daily. 30 tablet 11 09/18/2016 at Unknown time  . Prenatal Vit-Fe Fumarate-FA (MULTIVITAMIN-PRENATAL) 27-0.8 MG TABS tablet Take 1 tablet by mouth daily at 12 noon.   09/18/2016 at Unknown time  . albuterol (PROVENTIL HFA;VENTOLIN HFA) 108 (90 Base) MCG/ACT inhaler Inhale 1-2 puffs into the lungs every 6 (six) hours as needed for wheezing or shortness of breath. (Patient not taking: Reported on 07/06/2016) 1 Inhaler 0 Not Taking  . loratadine (CLARITIN) 10 MG tablet Take 1 tablet (10 mg total) by mouth daily. (Patient not taking: Reported on 09/19/2016) 14 tablet 0 Not Taking at Unknown  time  . promethazine (PHENERGAN) 25 MG tablet Take 1 tablet (25 mg total) by mouth every 6 (six) hours as needed for nausea or vomiting. (Patient not taking: Reported on 07/06/2016) 30 tablet 0 Not Taking    Review of Systems  Constitutional: Negative.  Negative for chills and fever.  HENT: Negative.   Respiratory: Negative.  Negative for shortness of breath.   Cardiovascular: Negative.  Negative for chest pain.  Gastrointestinal: Negative for abdominal pain, constipation, diarrhea, nausea and vomiting.  Genitourinary: Positive for vaginal bleeding. Negative for dysuria and vaginal discharge.  Neurological: Negative.  Negative for dizziness and headaches.  Psychiatric/Behavioral: Negative.    Physical Exam   Blood pressure (!) 128/54, pulse 88, temperature 98.4 F (36.9 C), temperature source Oral, resp. rate 18, height 5\' 9"  (1.753 m), weight (!) 303 lb (137.4 kg), last menstrual period 04/22/2016, SpO2 98 %.  Physical Exam  Nursing note and vitals reviewed. Constitutional: She is oriented to person, place, and time. She appears well-developed and well-nourished.  HENT:  Head: Normocephalic and atraumatic.  Eyes: Conjunctivae are normal. No scleral icterus.  Cardiovascular: Normal rate, regular rhythm and normal heart sounds.   Respiratory: Effort normal and breath sounds normal. No respiratory distress.  GI: Soft. She exhibits no distension. There is no tenderness.  Genitourinary: Cervix exhibits  friability. Cervix exhibits no motion tenderness. No bleeding in the vagina. Vaginal discharge found.  Genitourinary Comments: Thick white clumpy discharge along vagina walls and cervix, no blood  Neurological: She is alert and oriented to person, place, and time.  Skin: Skin is warm and dry.  Psychiatric: She has a normal mood and affect. Her behavior is normal. Judgment and thought content normal.   Bimanual exam: Cervix 0/long/high, firm, anterior, neg CMT, uterus nontender, gravid  uterus: S=D, adnexa without tenderness, enlargement, or mass  FHT: 150 bpm MAU Course  Procedures Results for orders placed or performed during the hospital encounter of 09/19/16 (from the past 24 hour(s))  Wet prep, genital     Status: Abnormal   Collection Time: 09/19/16  8:16 PM  Result Value Ref Range   Yeast Wet Prep HPF POC PRESENT (A) NONE SEEN   Trich, Wet Prep NONE SEEN NONE SEEN   Clue Cells Wet Prep HPF POC NONE SEEN NONE SEEN   WBC, Wet Prep HPF POC MANY (A) NONE SEEN   Sperm NONE SEEN   Urinalysis, Routine w reflex microscopic     Status: Abnormal   Collection Time: 09/19/16  8:22 PM  Result Value Ref Range   Color, Urine YELLOW YELLOW   APPearance CLEAR CLEAR   Specific Gravity, Urine 1.010 1.005 - 1.030   pH 6.0 5.0 - 8.0   Glucose, UA NEGATIVE NEGATIVE mg/dL   Hgb urine dipstick SMALL (A) NEGATIVE   Bilirubin Urine NEGATIVE NEGATIVE   Ketones, ur NEGATIVE NEGATIVE mg/dL   Protein, ur NEGATIVE NEGATIVE mg/dL   Nitrite NEGATIVE NEGATIVE   Leukocytes, UA SMALL (A) NEGATIVE   RBC / HPF 0-5 0 - 5 RBC/hpf   WBC, UA 0-5 0 - 5 WBC/hpf   Bacteria, UA RARE (A) NONE SEEN   Squamous Epithelial / LPF 0-5 (A) NONE SEEN   MDM UA Wet prep and gc/chlamydia A Pos blood type Assessment and Plan   1. Candidiasis of vulva and vagina   2. Hx of preeclampsia, prior pregnancy, currently pregnant   3. Vaginal bleeding in pregnancy, second trimester    -Discharge patient home in stable condition -Prescription for terazole cream sent to patient's pharmacy -[facility] -Bleeding precautions reviewed -Encouraged to return here or to other Urgent Care/ED if she develops worsening of symptoms, increase in pain, fever, or other concerning symptoms.   Cleone SlimCaroline Neill SN[facility] 09/19/2016, 9:24 PM    I confirm that I have verified the information documented in the CNM student's note and that I have also personally performed the physical exam  and all medical decision making activities.  Judeth HornLawrence, Kema [place], NP

## 2016-09-20 LAB — GC/CHLAMYDIA PROBE AMP (~~LOC~~) NOT AT ARMC
CHLAMYDIA, DNA PROBE: NEGATIVE
NEISSERIA GONORRHEA: NEGATIVE

## 2016-09-28 ENCOUNTER — Ambulatory Visit (INDEPENDENT_AMBULATORY_CARE_PROVIDER_SITE_OTHER): Payer: Medicaid Other | Admitting: Obstetrics

## 2016-09-28 ENCOUNTER — Encounter: Payer: Self-pay | Admitting: Obstetrics

## 2016-09-28 VITALS — BP 124/78 | HR 85 | Wt 303.4 lb

## 2016-09-28 DIAGNOSIS — Z3480 Encounter for supervision of other normal pregnancy, unspecified trimester: Secondary | ICD-10-CM

## 2016-09-28 DIAGNOSIS — Z3482 Encounter for supervision of other normal pregnancy, second trimester: Secondary | ICD-10-CM

## 2016-09-28 NOTE — Progress Notes (Signed)
Patient is concerned about the size of her baby 

## 2016-09-28 NOTE — Progress Notes (Signed)
Subjective:  Arva ChafeKendra D Woolworth is a 31 y.o. G2P1001 at 6048w1d being seen today for ongoing prenatal care.  She is currently monitored for the following issues for this low-risk pregnancy and has Hx of preeclampsia, prior pregnancy, currently pregnant and Encounter for supervision of normal pregnancy, antepartum on her problem list.  Patient reports no complaints.  Contractions: Not present. Vag. Bleeding: None.  Movement: Present. Denies leaking of fluid.   The following portions of the patient's history were reviewed and updated as appropriate: allergies, current medications, past family history, past medical history, past social history, past surgical history and problem list. Problem list updated.  Objective:   Vitals:   09/28/16 0823  BP: 124/78  Pulse: 85  Weight: (!) 303 lb 6.4 oz (137.6 kg)    Fetal Status: Fetal Heart Rate (bpm): 150   Movement: Present     General:  Alert, oriented and cooperative. Patient is in no acute distress.  Skin: Skin is warm and dry. No rash noted.   Cardiovascular: Normal heart rate noted  Respiratory: Normal respiratory effort, no problems with respiration noted  Abdomen: Soft, gravid, appropriate for gestational age. Pain/Pressure: Absent     Pelvic:  Cervical exam deferred        Extremities: Normal range of motion.  Edema: None  Mental Status: Normal mood and affect. Normal behavior. Normal judgment and thought content.   Urinalysis:      Assessment and Plan:  Pregnancy: G2P1001 at 3648w1d  1. Supervision of other normal pregnancy, antepartum   2. Morbid obesity (HCC)   Preterm labor symptoms and general obstetric precautions including but not limited to vaginal bleeding, contractions, leaking of fluid and fetal movement were reviewed in detail with the patient. Please refer to After Visit Summary for other counseling recommendations.  Return in about 4 weeks (around 10/26/2016) for ROB.   Brock BadHarper, Dalton Molesworth A, MD

## 2016-10-26 ENCOUNTER — Ambulatory Visit (INDEPENDENT_AMBULATORY_CARE_PROVIDER_SITE_OTHER): Payer: Medicaid Other | Admitting: Obstetrics

## 2016-10-26 ENCOUNTER — Encounter: Payer: Self-pay | Admitting: Obstetrics

## 2016-10-26 VITALS — BP 131/71 | HR 93 | Wt 310.5 lb

## 2016-10-26 DIAGNOSIS — Z3482 Encounter for supervision of other normal pregnancy, second trimester: Secondary | ICD-10-CM

## 2016-10-26 DIAGNOSIS — Z3480 Encounter for supervision of other normal pregnancy, unspecified trimester: Secondary | ICD-10-CM

## 2016-10-26 NOTE — Progress Notes (Signed)
Subjective:  Lisa Charles is a 31 y.o. G2P1001 at [redacted]w[redacted]d being seen today for ongoing prenatal care.  She is currently monitored for the following issues for this low-risk pregnancy and has Hx of preeclampsia, prior pregnancy, currently pregnant and Encounter for supervision of normal pregnancy, antepartum on her problem list.  Patient reports :  Heartburn  Contractions: Not present.  .  Movement: Present. Denies leaking of fluid.   The following portions of the patient's history were reviewed and updated as appropriate: allergies, current medications, past family history, past medical history, past social history, past surgical history and problem list. Problem list updated.  Objective:   Vitals:   10/26/16 1411  BP: 131/71  Pulse: 93  Weight: (!) 310 lb 8 oz (140.8 kg)    Fetal Status: Fetal Heart Rate (bpm): 150   Movement: Present     General:  Alert, oriented and cooperative. Patient is in no acute distress.  Skin: Skin is warm and dry. No rash noted.   Cardiovascular: Normal heart rate noted  Respiratory: Normal respiratory effort, no problems with respiration noted  Abdomen: Soft, gravid, appropriate for gestational age. Pain/Pressure: Absent     Pelvic:  Cervical exam deferred        Extremities: Normal range of motion.  Edema: Trace  Mental Status: Normal mood and affect. Normal behavior. Normal judgment and thought content.   Urinalysis:      Assessment and Plan:  Pregnancy: G2P1001 at [redacted]w[redacted]d  1. Supervision of other normal pregnancy, antepartum   Preterm labor symptoms and general obstetric precautions including but not limited to vaginal bleeding, contractions, leaking of fluid and fetal movement were reviewed in detail with the patient. Please refer to After Visit Summary for other counseling recommendations.  Return in about 2 weeks (around 11/09/2016) for ROB, 2 hour OGTT.   Brock Bad, MD

## 2016-11-09 ENCOUNTER — Ambulatory Visit (INDEPENDENT_AMBULATORY_CARE_PROVIDER_SITE_OTHER): Payer: Medicaid Other | Admitting: Obstetrics

## 2016-11-09 ENCOUNTER — Encounter: Payer: Self-pay | Admitting: Obstetrics

## 2016-11-09 ENCOUNTER — Other Ambulatory Visit: Payer: Medicaid Other

## 2016-11-09 VITALS — BP 133/79 | HR 88 | Wt 306.6 lb

## 2016-11-09 DIAGNOSIS — Z3480 Encounter for supervision of other normal pregnancy, unspecified trimester: Secondary | ICD-10-CM

## 2016-11-09 DIAGNOSIS — Z3483 Encounter for supervision of other normal pregnancy, third trimester: Secondary | ICD-10-CM

## 2016-11-09 DIAGNOSIS — O3663X0 Maternal care for excessive fetal growth, third trimester, not applicable or unspecified: Secondary | ICD-10-CM

## 2016-11-09 NOTE — Progress Notes (Signed)
Subjective:  Lisa Charles is a 31 y.o. G2P1001 at 4483w1d being seen today for ongoing prenatal care.  She is currently monitored for the following issues for this low-risk pregnancy and has Hx of preeclampsia, prior pregnancy, currently pregnant and Encounter for supervision of normal pregnancy, antepartum on her problem list.  Patient reports pressure.  Contractions: Not present. Vag. Bleeding: None.  Movement: Present. Denies leaking of fluid.   The following portions of the patient's history were reviewed and updated as appropriate: allergies, current medications, past family history, past medical history, past social history, past surgical history and problem list. Problem list updated.  Objective:   Vitals:   11/09/16 0838  BP: 133/79  Pulse: 88  Weight: (!) 306 lb 9.6 oz (139.1 kg)    Fetal Status: Fetal Heart Rate (bpm): 150   Movement: Present     General:  Alert, oriented and cooperative. Patient is in no acute distress.  Skin: Skin is warm and dry. No rash noted.   Cardiovascular: Normal heart rate noted  Respiratory: Normal respiratory effort, no problems with respiration noted  Abdomen: Soft, gravid, appropriate for gestational age. Pain/Pressure: Present     Pelvic:  Cervical exam deferred        Extremities: Normal range of motion.  Edema: None  Mental Status: Normal mood and affect. Normal behavior. Normal judgment and thought content.   Urinalysis:      Assessment and Plan:  Pregnancy: G2P1001 at 7583w1d  1. Supervision of other normal pregnancy, antepartum Rx: - Glucose Tolerance, 2 Hours w/1 Hour - CBC - HIV antibody - RPR  2. Excessive fetal growth affecting management of pregnancy in third trimester, single or unspecified fetus Rx: - US MFM OB FOLLOW UP; Future  Preterm labor symptoms and general obstetric precautions including but not limited to vaginal bleeding, contractions, leaking of fluid and fetal movement were reviewed in detail with the  patient. Please refer to After Visit Summary for other counseling recommendations.  Return in about 2 weeks (around 11/23/2016) for ROB.   Brock BadHarper, [name], MD

## 2016-11-09 NOTE — Progress Notes (Signed)
Patient is having ligament pain. Patient has pressure at times also.

## 2016-11-10 LAB — GLUCOSE TOLERANCE, 2 HOURS W/ 1HR
GLUCOSE, FASTING: 88 mg/dL (ref 65–91)
Glucose, 1 hour: 119 mg/dL (ref 65–179)
Glucose, 2 hour: 100 mg/dL (ref 65–152)

## 2016-11-10 LAB — CBC
Hematocrit: 33.4 % — ABNORMAL LOW (ref 34.0–46.6)
Hemoglobin: 11.1 g/dL (ref 11.1–15.9)
MCH: 28 pg (ref 26.6–33.0)
MCHC: 33.2 g/dL (ref 31.5–35.7)
MCV: 84 fL (ref 79–97)
PLATELETS: 236 10*3/uL (ref 150–379)
RBC: 3.97 x10E6/uL (ref 3.77–5.28)
RDW: 16.1 % — AB (ref 12.3–15.4)
WBC: 8.2 10*3/uL (ref 3.4–10.8)

## 2016-11-10 LAB — RPR: RPR Ser Ql: NONREACTIVE

## 2016-11-10 LAB — HIV ANTIBODY (ROUTINE TESTING W REFLEX): HIV Screen 4th Generation wRfx: NONREACTIVE

## 2016-11-12 ENCOUNTER — Emergency Department (HOSPITAL_COMMUNITY)
Admission: EM | Admit: 2016-11-12 | Discharge: 2016-11-12 | Disposition: A | Discharge status: Home / Self Care | Payer: Medicaid Other | Attending: Emergency Medicine | Admitting: Emergency Medicine

## 2016-11-12 ENCOUNTER — Encounter (HOSPITAL_COMMUNITY): Payer: Self-pay | Admitting: Emergency Medicine

## 2016-11-12 DIAGNOSIS — O26891 Other specified pregnancy related conditions, first trimester: Secondary | ICD-10-CM | POA: Insufficient documentation

## 2016-11-12 DIAGNOSIS — Z87891 Personal history of nicotine dependence: Secondary | ICD-10-CM | POA: Insufficient documentation

## 2016-11-12 DIAGNOSIS — J069 Acute upper respiratory infection, unspecified: Secondary | ICD-10-CM | POA: Insufficient documentation

## 2016-11-12 DIAGNOSIS — N939 Abnormal uterine and vaginal bleeding, unspecified: Secondary | ICD-10-CM | POA: Insufficient documentation

## 2016-11-12 DIAGNOSIS — Z7982 Long term (current) use of aspirin: Secondary | ICD-10-CM | POA: Insufficient documentation

## 2016-11-12 DIAGNOSIS — O163 Unspecified maternal hypertension, third trimester: Secondary | ICD-10-CM

## 2016-11-12 DIAGNOSIS — O161 Unspecified maternal hypertension, first trimester: Secondary | ICD-10-CM | POA: Insufficient documentation

## 2016-11-12 DIAGNOSIS — Z3A01 Less than 8 weeks gestation of pregnancy: Secondary | ICD-10-CM | POA: Insufficient documentation

## 2016-11-12 LAB — COMPREHENSIVE METABOLIC PANEL
ALBUMIN: 3.6 g/dL (ref 3.5–5.0)
ALT: 28 U/L (ref 14–54)
AST: 37 U/L (ref 15–41)
Alkaline Phosphatase: 105 U/L (ref 38–126)
Anion gap: 10 (ref 5–15)
BUN: 8 mg/dL (ref 6–20)
CHLORIDE: 103 mmol/L (ref 101–111)
CO2: 21 mmol/L — AB (ref 22–32)
Calcium: 9.6 mg/dL (ref 8.9–10.3)
Creatinine, Ser: 0.51 mg/dL (ref 0.44–1.00)
GFR calc Af Amer: >60 mL/min (ref >60)
GFR calc non Af Amer: >60 mL/min (ref >60)
Glucose, Bld: 84 mg/dL (ref 65–99)
POTASSIUM: 3.2 mmol/L — AB (ref 3.5–5.1)
SODIUM: 134 mmol/L — AB (ref 135–145)
TOTAL PROTEIN: 7.5 g/dL (ref 6.5–8.1)
Total Bilirubin: 0.3 mg/dL (ref 0.3–1.2)

## 2016-11-12 LAB — CBC WITH DIFFERENTIAL/PLATELET
BASOS ABS: 0 10*3/uL (ref 0.0–0.1)
Basophils Relative: 0 %
Eosinophils Absolute: 0.1 10*3/uL (ref 0.0–0.7)
Eosinophils Relative: 1 %
HEMATOCRIT: 34.5 % — AB (ref 36.0–46.0)
Hemoglobin: 11.3 g/dL — ABNORMAL LOW (ref 12.0–15.0)
LYMPHS ABS: 1.5 10*3/uL (ref 0.7–4.0)
LYMPHS PCT: 18 %
MCH: 27.8 pg (ref 26.0–34.0)
MCHC: 32.8 g/dL (ref 30.0–36.0)
MCV: 85 fL (ref 78.0–100.0)
MONO ABS: 0.7 10*3/uL (ref 0.1–1.0)
MONOS PCT: 9 %
NEUTROS ABS: 6.1 10*3/uL (ref 1.7–7.7)
Neutrophils Relative %: 72 %
Platelets: 251 10*3/uL (ref 150–400)
RBC: 4.06 MIL/uL (ref 3.87–5.11)
RDW: 14.8 % (ref 11.5–15.5)
WBC: 8.4 10*3/uL (ref 4.0–10.5)

## 2016-11-12 LAB — URINALYSIS, ROUTINE W REFLEX MICROSCOPIC
BILIRUBIN URINE: NEGATIVE
Glucose, UA: NEGATIVE mg/dL
Hgb urine dipstick: NEGATIVE
KETONES UR: 20 mg/dL — AB
Nitrite: NEGATIVE
PROTEIN: NEGATIVE mg/dL
Specific Gravity, Urine: 1.018 (ref 1.005–1.030)
pH: 6 (ref 5.0–8.0)

## 2016-11-12 LAB — RAPID STREP SCREEN (MED CTR MEBANE ONLY): STREPTOCOCCUS, GROUP A SCREEN (DIRECT): NEGATIVE

## 2016-11-12 MED ORDER — CROMOLYN SODIUM 5.2 MG/ACT NA AERS: 1.0000 | INHALATION_SPRAY | Freq: Four times a day (QID) | NASAL | 0 refills | Status: DC

## 2016-11-12 MED ORDER — ACETAMINOPHEN 325 MG PO TABS
650.0000 mg | ORAL_TABLET | Freq: Once | ORAL | Status: AC
  Administered 2016-11-12: 650 mg via ORAL
  Filled 2016-11-12: qty 2

## 2016-11-12 NOTE — ED Provider Notes (Signed)
[facility]-EMERGENCY DEPT Provider Note   CSN: [id number] Arrival date & time: 11/12/16  1546     History   Chief Complaint Chief Complaint  Patient presents with  . Otalgia  . Cough    HPI Lisa Charles is a 31 y.o. female with a h/o of pre-eclampsia who presents to the emergency department with a chief complaint of constant, worsening nasal congestion, non-productive cough, sore throat, sinus pressure, and feeling as if her bilateral ears are clogged that began 5 days ago. No fever, chills, dyspnea, or CP.   She reports that she is currently 7 months pregnant. She treated her symptoms at home with Afrin and Tylenol without improvement. She reports she was last seen by her OB, Dr. Clearance CootsHarper, on 9/5, but did not discuss her symptoms during their appointment.  She denies abdominal pain, nausea, vomiting. She reports she noted a minimal amount of vaginal bleeding last night on toilet tissue, but denies active bleeding. She reports the baby is kicking regularly.   The history is provided by the patient. No language interpreter was used.    Past Medical History:  Diagnosis Date  . Bronchitis   . Pre-eclampsia     Patient Active Problem List   Diagnosis Date Noted  . Encounter for supervision of normal pregnancy, antepartum 07/06/2016  . Hx of preeclampsia, prior pregnancy, currently pregnant 02/20/2012    Past Surgical History:  Procedure Laterality Date  . NO PAST SURGERIES      OB History    Gravida Para Term Preterm AB Living   2 1 1  0 0 1   SAB TAB Ectopic Multiple Live Births   0 0 0 0 1       Home Medications    Prior to Admission medications   Medication Sig Start Date End Date Taking? Authorizing Provider  acetaminophen (TYLENOL) 500 MG tablet Take 500 mg by mouth every 4 (four) hours as needed for moderate pain.   Yes [provider]  aspirin 81 MG tablet Take 1 tablet (81 mg total) by mouth daily. 07/18/16  Yes Anyanwu, Jethro BastosUgonna A, MD  oxymetazoline  (AFRIN) 0.05 % nasal spray Place 2 sprays into both nostrils 2 (two) times daily as needed for congestion.   Yes [provider]  Prenatal Vit-Fe Fumarate-FA (MULTIVITAMIN-PRENATAL) 27-0.8 MG TABS tablet Take 1 tablet by mouth daily at 12 noon.   Yes [provider]  cromolyn (NASALCROM) 5.2 MG/ACT nasal spray Place 1 spray into both nostrils 4 (four) times daily. 11/12/16   McDonald, Mia A, PA-C    Family History Family History  Problem Relation Age of Onset  . Diabetes Mother   . Hypertension Mother   . Hyperlipidemia Mother   . Gout Father     Social History Social History  Substance Use Topics  . Smoking status: Former Smoker    Quit date: 02/2016  . Smokeless tobacco: Former NeurosurgeonUser  . Alcohol use No     Allergies   Patient has no known allergies.   Review of Systems Review of Systems  Constitutional: Negative for activity change.  HENT: Positive for ear pain, sinus pressure and sore throat. Negative for ear discharge and hearing loss.   Respiratory: Positive for cough. Negative for shortness of breath.   Cardiovascular: Negative for chest pain.  Gastrointestinal: Negative for abdominal pain.  Musculoskeletal: Negative for back pain.  Skin: Negative for rash.     Physical Exam Updated Vital Signs BP 134/66 (BP Location: Left Arm)  Pulse 98   Temp 98.4 F (36.9 C) (Oral)   Resp 18   Ht  (1.778 m)   Wt (!) 138.8 kg (306 lb)   LMP 04/22/2016   SpO2 98%   BMI 43.91 kg/m   Physical Exam  Constitutional: No distress.  HENT:  Head: Normocephalic.  Right Ear: Tympanic membrane is not injected, not erythematous, not retracted and not bulging. A middle ear effusion is present.  Left Ear: Tympanic membrane is not injected, not erythematous, not retracted and not bulging. A middle ear effusion is present.  Nose: Mucosal edema and rhinorrhea present. Right sinus exhibits no maxillary sinus tenderness and no frontal sinus tenderness. Left sinus  exhibits no maxillary sinus tenderness and no frontal sinus tenderness.  Mouth/Throat: Uvula is midline and mucous membranes are normal. No trismus in the jaw. No uvula swelling. Posterior oropharyngeal erythema present. No oropharyngeal exudate, posterior oropharyngeal edema or tonsillar abscesses. No tonsillar exudate.  Eyes: Conjunctivae are normal.  Neck: Neck supple.  Cardiovascular: Normal rate and regular rhythm.  Exam reveals no gallop and no friction rub.   No murmur heard. Pulmonary/Chest: Effort normal. No respiratory distress. She has no wheezes. She has no rales.  Abdominal: Soft. She exhibits no distension and no mass. There is no tenderness. No hernia.  Neurological: She is alert.  Skin: Skin is warm. No rash noted.  Psychiatric: Her behavior is normal.  Nursing note and vitals reviewed.    ED Treatments / Results  Labs (all labs ordered are listed, but only abnormal results are displayed) Labs Reviewed  URINALYSIS, ROUTINE W REFLEX MICROSCOPIC - Abnormal; Notable for the following:       Result Value   Ketones, ur 20 (*)    Leukocytes, UA SMALL (*)    Bacteria, UA RARE (*)    Squamous Epithelial / LPF 0-5 (*)    All other components within normal limits  COMPREHENSIVE METABOLIC PANEL - Abnormal; Notable for the following:    Sodium 134 (*)    Potassium 3.2 (*)    CO2 21 (*)    All other components within normal limits  CBC WITH DIFFERENTIAL/PLATELET - Abnormal; Notable for the following:    Hemoglobin 11.3 (*)    HCT 34.5 (*)    All other components within normal limits  RAPID STREP SCREEN (NOT AT [facility])  CULTURE, GROUP A STREP ([facility])  URINE CULTURE  PROTEIN / CREATININE RATIO, URINE    EKG  EKG Interpretation None       Radiology No results found.  Procedures Procedures (including critical care time)  Medications Ordered in ED Medications  acetaminophen (TYLENOL) tablet 650 mg (650 mg Oral Given 11/12/16 1759)     Initial Impression /  Assessment and Plan / ED Course  I have reviewed the triage vital signs and the nursing notes.  Pertinent labs & imaging results that were available during my care of the patient were reviewed by me and considered in my medical decision making (see chart for details).     31 year old female with a history of preeclampsia who is currently 7 months pregnant presenting with URI-like symptoms 5 days. Afebrile. BP is 158/72, increased from her last OBGYN appointment 3 days ago at 133/79. She also reports minimal vaginal bleeding noted on toilet tissue last night. No gross bleeding today. Discussed the patient with Dr. Jacqulyn Bath, attending physician. No proteinuria on UA. Mild ketones and a small amount of leukocytes noted. Will send urine for culture. No urianry sx  at this time. Spoke with Dr. Debroah Loop, on-call OBGYN at [facility], who recommended close follow up with Dr. Clearance Coots in the next 2-3 days. Given the length of the patient's URI symptoms, antibiotics are not indicated at this time. Will discharge the patient with cromolyn nasal spray and Tylenol. Encouraged warm humidified air and nasal rinses. Encourage the patient to have her symptoms reevaluated when she follows up with Dr. Clearance Coots. Strict return precautions given. No acute distress. The patient safe for discharge at this time.  Final Clinical Impressions(s) / ED Diagnoses   Final diagnoses:  Viral upper respiratory tract infection  Elevated blood pressure affecting pregnancy in third trimester, antepartum    New Prescriptions Discharge Medication List as of 11/12/2016  8:04 PM    START taking these medications   Details  cromolyn (NASALCROM) 5.2 MG/ACT nasal spray Place 1 spray into both nostrils 4 (four) times daily., Starting Sat 11/12/2016, Print         McDonald, Mia A, PA-C 11/13/16 Levy Sjogren, MD 11/13/16 [phone]

## 2016-11-12 NOTE — ED Triage Notes (Signed)
Pt reports both of her ears are clogged. Also has sore throat and nasal congestion. Pt is 7 months pregnant. However pt has no pregnancy related complaints. Baby is kicking regularly. No abd pain.

## 2016-11-12 NOTE — Discharge Instructions (Signed)
If you develop new or worsening symptoms, including severe headache that doesn't improve with taking Tylenol, pain in your right upper belly that doesn't go aware, or shortness of breath, please return to the emergency department for reevaluation.  You can take 650 mg of Tylenol every 6 hours for headache and pain control. Please do not take more than 4000 mg in 24 hours. Heated humidified air can also help with your congestion.   Nasalcrom can be sprayed one time into each nostril up to 4 times per day.   Please call Dr. Verdell CarmineHarper's office on Monday morning to schedule a follow-up appointment. Please let him know if your symptoms have not improved at that time.

## 2016-11-13 LAB — PROTEIN / CREATININE RATIO, URINE
CREATININE, URINE: 120.72 mg/dL
Protein Creatinine Ratio: 0.1 mg/mg{Cre} (ref 0.00–0.15)
Total Protein, Urine: 12 mg/dL

## 2016-11-14 LAB — URINE CULTURE

## 2016-11-15 LAB — CULTURE, GROUP A STREP (THRC)

## 2016-11-21 ENCOUNTER — Ambulatory Visit (INDEPENDENT_AMBULATORY_CARE_PROVIDER_SITE_OTHER): Payer: Medicaid Other | Admitting: Obstetrics

## 2016-11-21 ENCOUNTER — Ambulatory Visit (HOSPITAL_COMMUNITY)
Admission: RE | Admit: 2016-11-21 | Discharge: 2016-11-21 | Disposition: A | Discharge status: Home / Self Care | Payer: Medicaid Other | Source: Ambulatory Visit | Attending: Obstetrics | Admitting: Obstetrics

## 2016-11-21 ENCOUNTER — Encounter (HOSPITAL_COMMUNITY): Payer: Self-pay

## 2016-11-21 ENCOUNTER — Other Ambulatory Visit: Payer: Self-pay | Admitting: Obstetrics

## 2016-11-21 ENCOUNTER — Encounter: Payer: Self-pay | Admitting: Obstetrics

## 2016-11-21 ENCOUNTER — Other Ambulatory Visit (HOSPITAL_COMMUNITY): Payer: Self-pay | Admitting: *Deleted

## 2016-11-21 VITALS — BP 122/71 | HR 80 | Wt 303.6 lb

## 2016-11-21 DIAGNOSIS — Z3A29 29 weeks gestation of pregnancy: Secondary | ICD-10-CM | POA: Insufficient documentation

## 2016-11-21 DIAGNOSIS — O09299 Supervision of pregnancy with other poor reproductive or obstetric history, unspecified trimester: Secondary | ICD-10-CM

## 2016-11-21 DIAGNOSIS — O09893 Supervision of other high risk pregnancies, third trimester: Secondary | ICD-10-CM | POA: Diagnosis not present

## 2016-11-21 DIAGNOSIS — O99213 Obesity complicating pregnancy, third trimester: Secondary | ICD-10-CM | POA: Diagnosis not present

## 2016-11-21 DIAGNOSIS — O3663X0 Maternal care for excessive fetal growth, third trimester, not applicable or unspecified: Secondary | ICD-10-CM | POA: Diagnosis present

## 2016-11-21 DIAGNOSIS — O3660X0 Maternal care for excessive fetal growth, unspecified trimester, not applicable or unspecified: Secondary | ICD-10-CM

## 2016-11-21 DIAGNOSIS — O09293 Supervision of pregnancy with other poor reproductive or obstetric history, third trimester: Secondary | ICD-10-CM | POA: Diagnosis not present

## 2016-11-21 DIAGNOSIS — Z23 Encounter for immunization: Secondary | ICD-10-CM

## 2016-11-21 DIAGNOSIS — O99210 Obesity complicating pregnancy, unspecified trimester: Secondary | ICD-10-CM

## 2016-11-21 DIAGNOSIS — E669 Obesity, unspecified: Secondary | ICD-10-CM

## 2016-11-21 DIAGNOSIS — Z362 Encounter for other antenatal screening follow-up: Secondary | ICD-10-CM | POA: Insufficient documentation

## 2016-11-21 DIAGNOSIS — Z3480 Encounter for supervision of other normal pregnancy, unspecified trimester: Secondary | ICD-10-CM

## 2016-11-21 DIAGNOSIS — O133 Gestational [pregnancy-induced] hypertension without significant proteinuria, third trimester: Secondary | ICD-10-CM | POA: Diagnosis not present

## 2016-11-21 NOTE — Progress Notes (Signed)
Subjective:  Lisa Charles is a 31 y.o. G2P1001 at [redacted]w[redacted]d being seen today for ongoing prenatal care.  She is currently monitored for the following issues for this low-risk pregnancy and has Hx of preeclampsia, prior pregnancy, currently pregnant and Encounter for supervision of normal pregnancy, antepartum on her problem list.  Patient reports no complaints.  Contractions: Not present. Vag. Bleeding: None.  Movement: Present. Denies leaking of fluid.   The following portions of the patient's history were reviewed and updated as appropriate: allergies, current medications, past family history, past medical history, past social history, past surgical history and problem list. Problem list updated.  Objective:   Vitals:   11/21/16 0942  BP: 122/71  Pulse: 80  Weight: (!) 303 lb 9.6 oz (137.7 kg)    Fetal Status: Fetal Heart Rate (bpm): 150   Movement: Present     General:  Alert, oriented and cooperative. Patient is in no acute distress.  Skin: Skin is warm and dry. No rash noted.   Cardiovascular: Normal heart rate noted  Respiratory: Normal respiratory effort, no problems with respiration noted  Abdomen: Soft, gravid, appropriate for gestational age. Pain/Pressure: Present     Pelvic:  Cervical exam deferred        Extremities: Normal range of motion.  Edema: Trace  Mental Status: Normal mood and affect. Normal behavior. Normal judgment and thought content.   Urinalysis:      Assessment and Plan:  Pregnancy: G2P1001 at [redacted]w[redacted]d  1. Supervision of other normal pregnancy, antepartum Rx: - Tdap vaccine greater than or equal to 7yo IM  2. Obesity affecting pregnancy, antepartum   Preterm labor symptoms and general obstetric precautions including but not limited to vaginal bleeding, contractions, leaking of fluid and fetal movement were reviewed in detail with the patient. Please refer to After Visit Summary for other counseling recommendations.  Return in about 2 weeks (around  12/05/2016) for ROB.   Brock Bad, MD

## 2016-11-21 NOTE — Progress Notes (Signed)
Patient reports good fetal movement with occasional pressure, denies contractions. 

## 2016-11-22 ENCOUNTER — Other Ambulatory Visit: Payer: Self-pay | Admitting: Obstetrics

## 2016-12-05 ENCOUNTER — Encounter: Payer: Self-pay | Admitting: *Deleted

## 2016-12-08 ENCOUNTER — Encounter: Payer: Self-pay | Admitting: Obstetrics

## 2016-12-08 ENCOUNTER — Ambulatory Visit (INDEPENDENT_AMBULATORY_CARE_PROVIDER_SITE_OTHER): Payer: Medicaid Other | Admitting: Obstetrics

## 2016-12-08 VITALS — BP 140/78 | HR 84 | Wt 305.0 lb

## 2016-12-08 DIAGNOSIS — O99213 Obesity complicating pregnancy, third trimester: Secondary | ICD-10-CM

## 2016-12-08 DIAGNOSIS — O99210 Obesity complicating pregnancy, unspecified trimester: Secondary | ICD-10-CM

## 2016-12-08 DIAGNOSIS — O3663X0 Maternal care for excessive fetal growth, third trimester, not applicable or unspecified: Secondary | ICD-10-CM

## 2016-12-08 DIAGNOSIS — Z3480 Encounter for supervision of other normal pregnancy, unspecified trimester: Secondary | ICD-10-CM

## 2016-12-08 NOTE — Progress Notes (Signed)
Subjective:  Lisa Charles is a 31 y.o. G2P1001 at [redacted]w[redacted]d being seen today for ongoing prenatal care.  She is currently monitored for the following issues for this low-risk pregnancy and has Hx of preeclampsia, prior pregnancy, currently pregnant and Encounter for supervision of normal pregnancy, antepartum on her problem list.  Patient reports no complaints.  Contractions: Not present. Vag. Bleeding: None.  Movement: Present. Denies leaking of fluid.   The following portions of the patient's history were reviewed and updated as appropriate: allergies, current medications, past family history, past medical history, past social history, past surgical history and problem list. Problem list updated.  Objective:   Vitals:   12/08/16 1449  BP: 140/78  Pulse: 84  Weight: (!) 305 lb (138.3 kg)    Fetal Status: Fetal Heart Rate (bpm): 150   Movement: Present     General:  Alert, oriented and cooperative. Patient is in no acute distress.  Skin: Skin is warm and dry. No rash noted.   Cardiovascular: Normal heart rate noted  Respiratory: Normal respiratory effort, no problems with respiration noted  Abdomen: Soft, gravid, appropriate for gestational age. Pain/Pressure: Present     Pelvic:  Cervical exam deferred        Extremities: Normal range of motion.     Mental Status: Normal mood and affect. Normal behavior. Normal judgment and thought content.   Urinalysis:      Assessment and Plan:  Pregnancy: G2P1001 at [redacted]w[redacted]d  1. Supervision of other normal pregnancy, antepartum   2. Obesity affecting pregnancy, antepartum   3. Excessive fetal growth affecting management of pregnancy in third trimester, single or unspecified fetus - EFW  87th %tile, AC > 97th %tile, at 29 weeks - repeat ultrasound scheduled in 6 weeks for interval growth  Preterm labor symptoms and general obstetric precautions including but not limited to vaginal bleeding, contractions, leaking of fluid and fetal movement  were reviewed in detail with the patient. Please refer to After Visit Summary for other counseling recommendations.  Return in about 2 weeks (around 12/22/2016) for ROB.   Brock Bad, MD

## 2016-12-26 ENCOUNTER — Ambulatory Visit (INDEPENDENT_AMBULATORY_CARE_PROVIDER_SITE_OTHER): Payer: Medicaid Other | Admitting: Obstetrics

## 2016-12-26 ENCOUNTER — Encounter: Payer: Self-pay | Admitting: Obstetrics

## 2016-12-26 VITALS — BP 139/78 | HR 87 | Wt 312.0 lb

## 2016-12-26 DIAGNOSIS — Z3483 Encounter for supervision of other normal pregnancy, third trimester: Secondary | ICD-10-CM

## 2016-12-26 DIAGNOSIS — O99213 Obesity complicating pregnancy, third trimester: Secondary | ICD-10-CM

## 2016-12-26 DIAGNOSIS — E669 Obesity, unspecified: Secondary | ICD-10-CM

## 2016-12-26 DIAGNOSIS — Z3480 Encounter for supervision of other normal pregnancy, unspecified trimester: Secondary | ICD-10-CM

## 2016-12-26 DIAGNOSIS — O99210 Obesity complicating pregnancy, unspecified trimester: Secondary | ICD-10-CM

## 2016-12-26 NOTE — Progress Notes (Signed)
Subjective:  Lisa Lisa is Charles 31 y.o. G2P1001 at [redacted]w[redacted]d being seen today for ongoing prenatal care.  She is currently monitored for the following issues for this low-risk pregnancy and has Hx of preeclampsia, prior pregnancy, currently pregnant and Encounter for supervision of normal pregnancy, antepartum on her problem list.  Patient reports occasional contractions.  Contractions: Not present. Vag. Bleeding: None.  Movement: Present. Denies leaking of fluid.   The following portions of the patient's history were reviewed and updated as appropriate: allergies, current medications, past family history, past medical history, past social history, past surgical history and problem list. Problem list updated.  Objective:   Vitals:   12/26/16 0909  BP: 139/78  Pulse: 87  Weight: (!) 312 lb (141.5 kg)    Fetal Status: Fetal Heart Rate (bpm): 150   Movement: Present     General:  Alert, oriented and cooperative. Patient is in no acute distress.  Skin: Skin is warm and dry. No rash noted.   Cardiovascular: Normal heart rate noted  Respiratory: Normal respiratory effort, no problems with respiration noted  Abdomen: Soft, gravid, appropriate for gestational age. Pain/Pressure: Present     Pelvic:  Cervical exam deferred        Extremities: Normal range of motion.     Mental Status: Normal mood and affect. Normal behavior. Normal judgment and thought content.   Urinalysis:      Assessment and Plan:  Pregnancy: G2P1001 at [redacted]w[redacted]d  1. Supervision of other normal pregnancy, antepartum  2. Obesity affecting pregnancy, antepartum   Preterm labor symptoms and general obstetric precautions including but not limited to vaginal bleeding, contractions, leaking of fluid and fetal movement were reviewed in detail with the patient. Please refer to After Visit Summary for other counseling recommendations.  Return in about 4 weeks (around 01/23/2017) for ROB, 2 hour OGTT.   Brock BadHarper, Lisa A, MD

## 2017-01-02 ENCOUNTER — Encounter (HOSPITAL_COMMUNITY): Payer: Self-pay

## 2017-01-02 ENCOUNTER — Ambulatory Visit (HOSPITAL_COMMUNITY)
Admission: RE | Admit: 2017-01-02 | Discharge: 2017-01-02 | Disposition: A | Discharge status: Home / Self Care | Payer: Medicaid Other | Source: Ambulatory Visit | Attending: Obstetrics | Admitting: Obstetrics

## 2017-01-02 DIAGNOSIS — O99213 Obesity complicating pregnancy, third trimester: Secondary | ICD-10-CM | POA: Diagnosis not present

## 2017-01-02 DIAGNOSIS — O3660X0 Maternal care for excessive fetal growth, unspecified trimester, not applicable or unspecified: Secondary | ICD-10-CM

## 2017-01-02 DIAGNOSIS — Z3A35 35 weeks gestation of pregnancy: Secondary | ICD-10-CM | POA: Diagnosis not present

## 2017-01-02 DIAGNOSIS — O3663X0 Maternal care for excessive fetal growth, third trimester, not applicable or unspecified: Secondary | ICD-10-CM | POA: Diagnosis not present

## 2017-01-02 DIAGNOSIS — O09299 Supervision of pregnancy with other poor reproductive or obstetric history, unspecified trimester: Secondary | ICD-10-CM | POA: Insufficient documentation

## 2017-01-03 ENCOUNTER — Ambulatory Visit (INDEPENDENT_AMBULATORY_CARE_PROVIDER_SITE_OTHER): Payer: Medicaid Other | Admitting: Obstetrics

## 2017-01-03 ENCOUNTER — Encounter: Payer: Self-pay | Admitting: Obstetrics

## 2017-01-03 VITALS — BP 139/84 | HR 79 | Wt 316.8 lb

## 2017-01-03 DIAGNOSIS — O3663X1 Maternal care for excessive fetal growth, third trimester, fetus 1: Secondary | ICD-10-CM

## 2017-01-03 DIAGNOSIS — Z3483 Encounter for supervision of other normal pregnancy, third trimester: Secondary | ICD-10-CM

## 2017-01-03 DIAGNOSIS — Z3480 Encounter for supervision of other normal pregnancy, unspecified trimester: Secondary | ICD-10-CM

## 2017-01-03 NOTE — Progress Notes (Signed)
Patient reports good fetal movement with pressure, denies contractions. 

## 2017-01-03 NOTE — Progress Notes (Signed)
Subjective:  Lisa Charles is a 31 y.o. G2P1001 at [redacted]w[redacted]d being seen today for ongoing prenatal care.  She is currently monitored for the following issues for this high-risk pregnancy and has Hx of preeclampsia, prior pregnancy, currently pregnant and Encounter for supervision of normal pregnancy, antepartum on her problem list.  Patient reports no complaints.  Contractions: Not present. Vag. Bleeding: None.  Movement: Present. Denies leaking of fluid.   The following portions of the patient's history were reviewed and updated as appropriate: allergies, current medications, past family history, past medical history, past social history, past surgical history and problem list. Problem list updated.  Objective:   Vitals:   01/03/17 1552  BP: 139/84  Pulse: 79  Weight: (!) 316 lb 12.8 oz (143.7 kg)    Fetal Status:     Movement: Present     General:  Alert, oriented and cooperative. Patient is in no acute distress.  Skin: Skin is warm and dry. No rash noted.   Cardiovascular: Normal heart rate noted  Respiratory: Normal respiratory effort, no problems with respiration noted  Abdomen: Soft, gravid, appropriate for gestational age. Pain/Pressure: Present     Pelvic:  Cervical exam deferred        Extremities: Normal range of motion.  Edema: Trace  Mental Status: Normal mood and affect. Normal behavior. Normal judgment and thought content.   Urinalysis:     WNL's   [place] MFM OB FOLLOW UP (Accession [id number]) (Order [id number])  Imaging  Date: 01/02/2017 Department: [facility] MATERNAL FETAL CARE ULTRASOUND Released By: Elesa Hacker Authorizing: Particia Nearing, MD  Exam Information   Status Exam Begun  Exam Ended   Final [99] 01/02/2017 9:00 AM 01/02/2017 9:30 AM  PACS Images   Show images for [place] MFM OB FOLLOW UP  Study Result   ----------------------------------------------------------------------  OBSTETRICS REPORT                      (Signed Final 01/02/2017  09:44 am) ---------------------------------------------------------------------- Patient Info  ID #:       [id number]                          D.O.B.:  [birth year] (31 yrs)  Name:       Lisa Charles                  Visit Date: 01/02/2017 08:53 am ---------------------------------------------------------------------- Performed By  Performed By:     Hurman Horn          Ref. Address:     [address]                                                             [place], [place]  [address]  Attending:        Charlsie Merles MD         Location:         [facility]  Referred By:      Brock Bad MD ---------------------------------------------------------------------- Orders   #  Description                                 Code   1  [place] MFM OB FOLLOW UP                         [phone]  ----------------------------------------------------------------------   #  Ordered By               Order #        Accession #    Episode #   1  Particia Nearing            [id number]      [id number]     [id number]  ---------------------------------------------------------------------- Indications   [redacted] weeks gestation of pregnancy                Z3A.35   Poor obstetric history: Previous               O09.299   preeclampsia / eclampsia/gestational HTN   Obesity complicating pregnancy, third          O99.213   trimester  ---------------------------------------------------------------------- OB History  Blood Type:            Height:  5'10"  Weight (lb):  301       BMI:  43.18  Gravidity:    2         Term:   1        Prem:   0        SAB:   0  TOP:          0       Ectopic:  0        Living: 1 ---------------------------------------------------------------------- Fetal Evaluation  Num Of Fetuses:     1  Fetal Heart         150   Rate(bpm):  Cardiac Activity:   Observed  Presentation:       Cephalic  Placenta:           Anterior, above cervical os  P. Cord Insertion:  Visualized, central  Amniotic Fluid  AFI FV:      Subjectively within normal limits  AFI Sum(cm)     %Tile       Largest Pocket(cm)  10.37           24          3.45  RUQ(cm)       RLQ(cm)       LUQ(cm)        LLQ(cm)  3.23          2.61          1.08           3.45 ---------------------------------------------------------------------- Biometry  BPD:      90.9  mm     G. Age:  36w 6d         83  %    CI:  74.06   %    70 - 86                                                          FL/HC:      20.9   %    20.1 - 22.1  HC:      335.4  mm     G. Age:  38w 3d         79  %    HC/AC:      0.98        0.93 - 1.11  AC:      342.5  mm     G. Age:  38w 1d       > 97  %    FL/BPD:     77.2   %    71 - 87  FL:       70.2  mm     G. Age:  36w 0d         48  %    FL/AC:      20.5   %    20 - 24  Est. FW:    3234  gm      7 lb 2 oz     89  % ---------------------------------------------------------------------- Gestational Age  LMP:           36w 3d        Date:  04/22/16                 EDD:   01/27/17  U/S Today:     37w 3d                                        EDD:   01/20/17  Best:          35w 6d     Det. By:  Previous Ultrasound      EDD:   01/31/17                                      (06/08/16) ---------------------------------------------------------------------- Anatomy  Cranium:               Appears normal         Aortic Arch:            Previously seen  Cavum:                 Previously seen        Ductal Arch:            Previously seen  Ventricles:            Previously seen        Diaphragm:              Appears normal  Choroid Plexus:        Previously seen        Stomach:                Appears normal, left  sided  Cerebellum:            Previously seen         Abdomen:                Appears normal  Posterior Fossa:       Previously seen        Abdominal Wall:         Previously seen  Nuchal Fold:           Previously seen        Cord Vessels:           Previously seen  Face:                  Orbits and profile     Kidneys:                Previously seen                         previously seen  Lips:                  Previously seen        Bladder:                Appears normal  Thoracic:              Appears normal         Spine:                  Previously seen  Heart:                 Previously seen        Upper Extremities:      Previously seen  RVOT:                  Previously seen        Lower Extremities:      Previously seen  LVOT:                  Previously seen  Other:  Fetus appears to be a female. Nasal bone previously visualized.          Technically difficult due to maternal habitus and fetal position. ---------------------------------------------------------------------- Cervix Uterus Adnexa  Cervix  Not visualized (advanced GA >29wks)  Uterus  No abnormality visualized.  Left Ovary  Not visualized.  Right Ovary  Within normal limits.  Adnexa:       No abnormality visualized. ---------------------------------------------------------------------- Impression  SIUP at 35+6 weeks with CHTN (no meds)  Normal interval anatomy; anatomic survey complete  Normal amniotic fluid volume  EFW at the 89th percentile; AC > 97th percentile ---------------------------------------------------------------------- Recommendations  Given no need for medication, would plan to deliver at 39  weeks.  No further apointment at [facility] made, but can consider a  scan in 3 weeks if undelivered  [facility] perform antepartum testing until delivery ----------------------------------------------------------------------                 Charlsie Merles, MD Electronically Signed Final Report   01/02/2017 09:44  am ----------------------------------------------------------------------     Assessment and Plan:  Pregnancy: G2P1001 at [redacted]w[redacted]d  1. Supervision of other normal pregnancy, antepartum Rx: - Strep Gp B NAA - Amniotic fluid index with NST; Future  2. LGA (large for gestational age) fetus affecting management of mother, third trimester, fetus 1 Rx: - Weekly Amniotic Fluid Index with NST; Future recommended  by MFM  - delivery at 39 weeks recommended by MFM for LGA  Preterm labor symptoms and general obstetric precautions including but not limited to vaginal bleeding, contractions, leaking of fluid and fetal movement were reviewed in detail with the patient. Please refer to After Visit Summary for other counseling recommendations.  Return in about 1 week (around 01/10/2017) for ROB.  AFI with NST.Marland Kitchen.   Brock BadHarper, Cristhian Vanhook A, MD

## 2017-01-05 LAB — STREP GP B NAA: STREP GROUP B AG: POSITIVE — AB

## 2017-01-06 ENCOUNTER — Inpatient Hospital Stay (HOSPITAL_COMMUNITY)
Admission: AD | Admit: 2017-01-06 | Discharge: 2017-01-07 | Disposition: A | Discharge status: Home / Self Care | Payer: Medicaid Other | Source: Ambulatory Visit | Attending: Obstetrics & Gynecology | Admitting: Obstetrics & Gynecology

## 2017-01-06 DIAGNOSIS — O26893 Other specified pregnancy related conditions, third trimester: Secondary | ICD-10-CM | POA: Diagnosis present

## 2017-01-06 DIAGNOSIS — Z7982 Long term (current) use of aspirin: Secondary | ICD-10-CM | POA: Insufficient documentation

## 2017-01-06 DIAGNOSIS — O9989 Other specified diseases and conditions complicating pregnancy, childbirth and the puerperium: Secondary | ICD-10-CM

## 2017-01-06 DIAGNOSIS — Z3A36 36 weeks gestation of pregnancy: Secondary | ICD-10-CM

## 2017-01-06 DIAGNOSIS — M545 Low back pain: Secondary | ICD-10-CM | POA: Insufficient documentation

## 2017-01-06 DIAGNOSIS — M549 Dorsalgia, unspecified: Secondary | ICD-10-CM | POA: Diagnosis not present

## 2017-01-06 DIAGNOSIS — O09299 Supervision of pregnancy with other poor reproductive or obstetric history, unspecified trimester: Secondary | ICD-10-CM

## 2017-01-06 NOTE — MAU Note (Signed)
Sharp pains from lower back going up my spine and lasted for . Pain has stopped now but "feel a cold chill up my spine". Denies LOF or vag bleeding. Threw up once today

## 2017-01-07 ENCOUNTER — Encounter (HOSPITAL_COMMUNITY): Payer: Self-pay | Admitting: *Deleted

## 2017-01-07 DIAGNOSIS — O9989 Other specified diseases and conditions complicating pregnancy, childbirth and the puerperium: Secondary | ICD-10-CM | POA: Diagnosis not present

## 2017-01-07 DIAGNOSIS — M549 Dorsalgia, unspecified: Secondary | ICD-10-CM

## 2017-01-07 LAB — URINALYSIS, ROUTINE W REFLEX MICROSCOPIC
BILIRUBIN URINE: NEGATIVE
Glucose, UA: NEGATIVE mg/dL
HGB URINE DIPSTICK: NEGATIVE
KETONES UR: NEGATIVE mg/dL
Nitrite: NEGATIVE
PH: 5 (ref 5.0–8.0)
Protein, ur: 100 mg/dL — AB
SPECIFIC GRAVITY, URINE: 1.021 (ref 1.005–1.030)

## 2017-01-07 MED ORDER — CYCLOBENZAPRINE HCL 10 MG PO TABS
10.0000 mg | ORAL_TABLET | Freq: Once | ORAL | Status: AC
  Administered 2017-01-07: 10 mg via ORAL
  Filled 2017-01-07: qty 1

## 2017-01-07 MED ORDER — CYCLOBENZAPRINE HCL 10 MG PO TABS: 10.0000 mg | ORAL_TABLET | Freq: Two times a day (BID) | ORAL | 0 refills | Status: DC | PRN

## 2017-01-07 NOTE — Progress Notes (Signed)
Heather Hogan CNM in to discuss d/c plan. Written and verbal d/c instructions given and understanding voiced. 

## 2017-01-07 NOTE — Discharge Instructions (Signed)

## 2017-01-07 NOTE — MAU Provider Note (Signed)
History     CSN: [id number]  Arrival date and time: 01/06/17 2321   First Provider Initiated Contact with Patient 01/07/17 0101      Chief Complaint  Patient presents with  . Back Pain   Lisa Charles is a 31 y.o. G2P1001 at 104w4d who presents today with back pain. She states that the pain started around 2145, and it comes and goes. She denies any VB or LOF. She reports normal fetal movement. She has a history of pre-eclampsia with her last pregnancy. She is taking daily ASA. No other complications.    Back Pain  This is a new problem. The current episode started today. The problem occurs intermittently. The problem is unchanged. The pain is present in the lumbar spine. The pain is at a severity of 8/10. Pertinent negatives include no fever or headaches. Risk factors include pregnancy and obesity. She has tried nothing for the symptoms.    Past Medical History:  Diagnosis Date  . Bronchitis   . Pre-eclampsia     Past Surgical History:  Procedure Laterality Date  . NO PAST SURGERIES      Family History  Problem Relation Age of Onset  . Diabetes Mother   . Hypertension Mother   . Hyperlipidemia Mother   . Gout Father     Social History  Substance Use Topics  . Smoking status: Former Smoker    Quit date: 02/2016  . Smokeless tobacco: Former Neurosurgeon  . Alcohol use No    Allergies: No Known Allergies  Prescriptions Prior to Admission  Medication Sig Dispense Refill Last Dose  . acetaminophen (TYLENOL) 500 MG tablet Take 500 mg by mouth every 4 (four) hours as needed for moderate pain.   Past Month at Unknown time  . aspirin 81 MG tablet Take 1 tablet (81 mg total) by mouth daily. 30 tablet 11 01/06/2017 at Unknown time  . Prenatal Vit-Fe Fumarate-FA (MULTIVITAMIN-PRENATAL) 27-0.8 MG TABS tablet Take 1 tablet by mouth daily at 12 noon.   01/06/2017 at Unknown time  . cromolyn (NASALCROM) 5.2 MG/ACT nasal spray Place 1 spray into both nostrils 4 (four) times daily.  (Patient not taking: Reported on 11/21/2016) 26 mL 0 Not Taking  . oxymetazoline (AFRIN) 0.05 % nasal spray Place 2 sprays into both nostrils 2 (two) times daily as needed for congestion.   Not Taking    Review of Systems  Constitutional: Negative for chills and fever.  Eyes: Negative for visual disturbance.  Gastrointestinal: Negative for nausea and vomiting.  Genitourinary: Negative for vaginal bleeding and vaginal discharge.  Musculoskeletal: Positive for back pain.  Neurological: Negative for headaches.   Physical Exam   Blood pressure 139/63, pulse 70, temperature (!) 97.5 F (36.4 C), resp. rate 18, last menstrual period 04/22/2016.  Physical Exam  Nursing note and vitals reviewed. Constitutional: She is oriented to person, place, and time. She appears well-developed and well-nourished. No distress.  HENT:  Head: Normocephalic.  Cardiovascular: Normal rate.   Respiratory: Effort normal.  GI: Soft. There is no tenderness. There is no rebound.  Genitourinary:  Genitourinary Comments: Cervix: 1 at ext os/closed at int os/thick/-3   Neurological: She is alert and oriented to person, place, and time.  Skin: Skin is warm and dry.  Psychiatric: She has a normal mood and affect.   Results for orders placed or performed during the hospital encounter of 01/06/17 (from the past 24 hour(s))  Urinalysis, Routine w reflex microscopic     Status: Abnormal  Collection Time: 01/06/17 11:40 PM  Result Value Ref Range   Color, Urine YELLOW YELLOW   APPearance CLOUDY (A) CLEAR   Specific Gravity, Urine 1.021 1.005 - 1.030   pH 5.0 5.0 - 8.0   Glucose, UA NEGATIVE NEGATIVE mg/dL   Hgb urine dipstick NEGATIVE NEGATIVE   Bilirubin Urine NEGATIVE NEGATIVE   Ketones, ur NEGATIVE NEGATIVE mg/dL   Protein, ur 161100 (A) NEGATIVE mg/dL   Nitrite NEGATIVE NEGATIVE   Leukocytes, UA LARGE (A) NEGATIVE   RBC / HPF 0-5 0 - 5 RBC/hpf   WBC, UA 6-30 0 - 5 WBC/hpf   Bacteria, UA RARE (A) NONE SEEN    Squamous Epithelial / LPF 6-30 (A) NONE SEEN   Mucus PRESENT    Ca Oxalate Crys, UA PRESENT     FHT: 125, moderate with 15x15 accel, no decels Toco: irregular UCs, some UI MAU Course  Procedures  MDM Patient given flexeril. She reports that her back pain has improved.   Assessment and Plan   1. Back pain affecting pregnancy in third trimester   2. Hx of preeclampsia, prior pregnancy, currently pregnant   3. [redacted] weeks gestation of pregnancy    DC home Comfort measures reviewed  3rd Trimester precautions  PTL precautions  Fetal kick counts RX: flexeril PRN #20  Return to MAU as needed FU with OB as planned  Follow-up Information    [facility] [place] Follow up.   Specialty:  Obstetrics and Gynecology Contact information: [address], [address] [place] [id number] [phone]           Thressa ShellerHeather Hogan 01/07/2017, 1:15 AM

## 2017-01-09 ENCOUNTER — Ambulatory Visit (INDEPENDENT_AMBULATORY_CARE_PROVIDER_SITE_OTHER): Payer: Medicaid Other | Admitting: Obstetrics

## 2017-01-09 ENCOUNTER — Encounter: Payer: Self-pay | Admitting: Obstetrics

## 2017-01-09 ENCOUNTER — Other Ambulatory Visit: Payer: Medicaid Other

## 2017-01-09 VITALS — BP 120/69 | HR 86 | Wt 316.0 lb

## 2017-01-09 DIAGNOSIS — O3663X0 Maternal care for excessive fetal growth, third trimester, not applicable or unspecified: Secondary | ICD-10-CM | POA: Diagnosis not present

## 2017-01-09 DIAGNOSIS — O09293 Supervision of pregnancy with other poor reproductive or obstetric history, third trimester: Secondary | ICD-10-CM | POA: Diagnosis not present

## 2017-01-09 DIAGNOSIS — O3663X1 Maternal care for excessive fetal growth, third trimester, fetus 1: Secondary | ICD-10-CM

## 2017-01-09 DIAGNOSIS — E669 Obesity, unspecified: Secondary | ICD-10-CM

## 2017-01-09 DIAGNOSIS — O99213 Obesity complicating pregnancy, third trimester: Secondary | ICD-10-CM

## 2017-01-09 DIAGNOSIS — O99210 Obesity complicating pregnancy, unspecified trimester: Secondary | ICD-10-CM

## 2017-01-09 DIAGNOSIS — Z3480 Encounter for supervision of other normal pregnancy, unspecified trimester: Secondary | ICD-10-CM

## 2017-01-09 DIAGNOSIS — O09299 Supervision of pregnancy with other poor reproductive or obstetric history, unspecified trimester: Secondary | ICD-10-CM

## 2017-01-09 DIAGNOSIS — O288 Other abnormal findings on antenatal screening of mother: Secondary | ICD-10-CM

## 2017-01-09 NOTE — Progress Notes (Signed)
ROB/NST/AFI. Had a sharp pain in her back on 01/06/17 for 45 mins x1. 10/10.

## 2017-01-09 NOTE — Progress Notes (Addendum)
Subjective:  Arva ChafeKendra D Evitts is a 31 y.o. G2P1001 at 5158w6d being seen today for ongoing prenatal care.  She is currently monitored for the following issues for this high-risk pregnancy and has Hx of preeclampsia, prior pregnancy, currently pregnant and Encounter for supervision of normal pregnancy, antepartum on their problem list.  Patient reports no complaints.  Contractions: Not present. Vag. Bleeding: None.  Movement: Present. Denies leaking of fluid.   The following portions of the patient's history were reviewed and updated as appropriate: allergies, current medications, past family history, past medical history, past social history, past surgical history and problem list. Problem list updated.  Objective:   Vitals:   01/09/17 1036  BP: 120/69  Pulse: 86  Weight: (!) 316 lb (143.3 kg)    Fetal Status: Fetal Heart Rate (bpm): NST-R   Movement: Present     General:  Alert, oriented and cooperative. Patient is in no acute distress.  Skin: Skin is warm and dry. No rash noted.   Cardiovascular: Normal heart rate noted  Respiratory: Normal respiratory effort, no problems with respiration noted  Abdomen: Soft, gravid, appropriate for gestational age. Pain/Pressure: Absent     Pelvic:  Cervical exam deferred        Extremities: Normal range of motion.  Edema: None  Mental Status: Normal mood and affect. Normal behavior. Normal judgment and thought content.   Urinalysis:    WNL's  NST:  Reactive.  Baseline FHR 140's.  Good variability.  15x15 accels.  No decels.  Assessment and Plan:  Pregnancy: G2P1001 at 6358w6d  1. Supervision of other normal pregnancy, antepartum   2. Hx of preeclampsia, prior pregnancy, currently pregnant Rx: - Fetal nonstress test; Future - US OB Limited; Future  3. LGA (large for gestational age) fetus affecting management of mother, third trimester, fetus 1 - delivery at 6839 weeks  4. Obesity affecting pregnancy, antepartum   5. AFI (amniotic fluid  index) borderline low - weekly AFI / NST  Preterm labor symptoms and general obstetric precautions including but not limited to vaginal bleeding, contractions, leaking of fluid and fetal movement were reviewed in detail with the patient. Please refer to After Visit Summary for other counseling recommendations.  No Follow-up on file.   Brock BadHarper, Avryl Roehm A, MD

## 2017-01-10 ENCOUNTER — Other Ambulatory Visit: Payer: Self-pay

## 2017-01-10 DIAGNOSIS — O0993 Supervision of high risk pregnancy, unspecified, third trimester: Secondary | ICD-10-CM

## 2017-01-12 ENCOUNTER — Ambulatory Visit (INDEPENDENT_AMBULATORY_CARE_PROVIDER_SITE_OTHER): Payer: Medicaid Other | Admitting: *Deleted

## 2017-01-12 VITALS — BP 128/81 | HR 80

## 2017-01-12 DIAGNOSIS — O3663X0 Maternal care for excessive fetal growth, third trimester, not applicable or unspecified: Secondary | ICD-10-CM | POA: Diagnosis not present

## 2017-01-12 DIAGNOSIS — O09299 Supervision of pregnancy with other poor reproductive or obstetric history, unspecified trimester: Secondary | ICD-10-CM

## 2017-01-12 DIAGNOSIS — Z3493 Encounter for supervision of normal pregnancy, unspecified, third trimester: Secondary | ICD-10-CM

## 2017-01-12 DIAGNOSIS — O3663X1 Maternal care for excessive fetal growth, third trimester, fetus 1: Secondary | ICD-10-CM

## 2017-01-12 NOTE — Progress Notes (Signed)
[facility] today [facility] Pt states she is doing well, no concerns today.  NST reviewed with Dr Clearance CootsHarper, reactive. Pt was removed from monitor. Pt has next appt on Monday and advised delivery will likely be scheduled at that visit.

## 2017-01-16 ENCOUNTER — Encounter (HOSPITAL_COMMUNITY): Payer: Self-pay | Admitting: Certified Registered Nurse Anesthetist

## 2017-01-16 ENCOUNTER — Encounter: Payer: Medicaid Other | Admitting: Obstetrics

## 2017-01-16 ENCOUNTER — Other Ambulatory Visit: Payer: Self-pay

## 2017-01-16 ENCOUNTER — Ambulatory Visit (INDEPENDENT_AMBULATORY_CARE_PROVIDER_SITE_OTHER): Payer: Medicaid Other | Admitting: Obstetrics and Gynecology

## 2017-01-16 ENCOUNTER — Other Ambulatory Visit: Payer: Medicaid Other

## 2017-01-16 ENCOUNTER — Encounter (HOSPITAL_COMMUNITY): Payer: Self-pay | Admitting: *Deleted

## 2017-01-16 ENCOUNTER — Inpatient Hospital Stay (HOSPITAL_COMMUNITY)
Admission: AD | Admit: 2017-01-16 | Discharge: 2017-01-19 | DRG: 807 | Disposition: A | Discharge status: Home / Self Care | Payer: Medicaid Other | Source: Ambulatory Visit | Attending: Family Medicine | Admitting: Family Medicine

## 2017-01-16 VITALS — BP 147/82 | HR 83 | Wt 314.0 lb

## 2017-01-16 DIAGNOSIS — Z7982 Long term (current) use of aspirin: Secondary | ICD-10-CM | POA: Diagnosis not present

## 2017-01-16 DIAGNOSIS — O10919 Unspecified pre-existing hypertension complicating pregnancy, unspecified trimester: Secondary | ICD-10-CM

## 2017-01-16 DIAGNOSIS — Z3480 Encounter for supervision of other normal pregnancy, unspecified trimester: Secondary | ICD-10-CM

## 2017-01-16 DIAGNOSIS — Z880 Allergy status to penicillin: Secondary | ICD-10-CM

## 2017-01-16 DIAGNOSIS — O99820 Streptococcus B carrier state complicating pregnancy: Secondary | ICD-10-CM

## 2017-01-16 DIAGNOSIS — O99214 Obesity complicating childbirth: Secondary | ICD-10-CM | POA: Diagnosis present

## 2017-01-16 DIAGNOSIS — Z3A37 37 weeks gestation of pregnancy: Secondary | ICD-10-CM | POA: Diagnosis not present

## 2017-01-16 DIAGNOSIS — O134 Gestational [pregnancy-induced] hypertension without significant proteinuria, complicating childbirth: Principal | ICD-10-CM | POA: Diagnosis present

## 2017-01-16 DIAGNOSIS — O99824 Streptococcus B carrier state complicating childbirth: Secondary | ICD-10-CM | POA: Diagnosis present

## 2017-01-16 DIAGNOSIS — Z23 Encounter for immunization: Secondary | ICD-10-CM | POA: Diagnosis not present

## 2017-01-16 DIAGNOSIS — Z87891 Personal history of nicotine dependence: Secondary | ICD-10-CM

## 2017-01-16 DIAGNOSIS — O139 Gestational [pregnancy-induced] hypertension without significant proteinuria, unspecified trimester: Secondary | ICD-10-CM | POA: Diagnosis present

## 2017-01-16 DIAGNOSIS — O09299 Supervision of pregnancy with other poor reproductive or obstetric history, unspecified trimester: Secondary | ICD-10-CM

## 2017-01-16 DIAGNOSIS — R030 Elevated blood-pressure reading, without diagnosis of hypertension: Secondary | ICD-10-CM | POA: Diagnosis present

## 2017-01-16 DIAGNOSIS — O09293 Supervision of pregnancy with other poor reproductive or obstetric history, third trimester: Secondary | ICD-10-CM

## 2017-01-16 DIAGNOSIS — O10913 Unspecified pre-existing hypertension complicating pregnancy, third trimester: Secondary | ICD-10-CM

## 2017-01-16 LAB — CBC
HCT: 31.6 % — ABNORMAL LOW (ref 36.0–46.0)
Hemoglobin: 11.2 g/dL — ABNORMAL LOW (ref 12.0–15.0)
MCH: 29.6 pg (ref 26.0–34.0)
MCHC: 35.4 g/dL (ref 30.0–36.0)
MCV: 83.4 fL (ref 78.0–100.0)
PLATELETS: 195 10*3/uL (ref 150–400)
RBC: 3.79 MIL/uL — AB (ref 3.87–5.11)
RDW: 15.2 % (ref 11.5–15.5)
WBC: 9.9 10*3/uL (ref 4.0–10.5)

## 2017-01-16 LAB — URINALYSIS, ROUTINE W REFLEX MICROSCOPIC
Bilirubin Urine: NEGATIVE
Glucose, UA: NEGATIVE mg/dL
HGB URINE DIPSTICK: NEGATIVE
Ketones, ur: NEGATIVE mg/dL
Nitrite: NEGATIVE
PROTEIN: NEGATIVE mg/dL
SPECIFIC GRAVITY, URINE: 1.01 (ref 1.005–1.030)
pH: 7 (ref 5.0–8.0)

## 2017-01-16 LAB — TYPE AND SCREEN
ABO/RH(D): A POS
ANTIBODY SCREEN: NEGATIVE

## 2017-01-16 LAB — COMPREHENSIVE METABOLIC PANEL
ALT: 16 U/L (ref 14–54)
AST: 23 U/L (ref 15–41)
Albumin: 3.4 g/dL — ABNORMAL LOW (ref 3.5–5.0)
Alkaline Phosphatase: 180 U/L — ABNORMAL HIGH (ref 38–126)
Anion gap: 8 (ref 5–15)
BUN: 8 mg/dL (ref 6–20)
CALCIUM: 9.5 mg/dL (ref 8.9–10.3)
CO2: 23 mmol/L (ref 22–32)
CREATININE: 0.58 mg/dL (ref 0.44–1.00)
Chloride: 106 mmol/L (ref 101–111)
GFR calc non Af Amer: >60 mL/min (ref >60)
Glucose, Bld: 82 mg/dL (ref 65–99)
Potassium: 4 mmol/L (ref 3.5–5.1)
SODIUM: 137 mmol/L (ref 135–145)
TOTAL PROTEIN: 7.6 g/dL (ref 6.5–8.1)
Total Bilirubin: 0.4 mg/dL (ref 0.3–1.2)

## 2017-01-16 LAB — PROTEIN / CREATININE RATIO, URINE
CREATININE, URINE: 48 mg/dL
Protein Creatinine Ratio: 0.13 mg/mg{Cre} (ref 0.00–0.15)
TOTAL PROTEIN, URINE: 6 mg/dL

## 2017-01-16 MED ORDER — FENTANYL 2.5 MCG/ML BUPIVACAINE 1/10 % EPIDURAL INFUSION (WH - ANES)
INTRAMUSCULAR | Status: AC
  Filled 2017-01-16: qty 100

## 2017-01-16 MED ORDER — PHENYLEPHRINE 40 MCG/ML (10ML) SYRINGE FOR IV PUSH (FOR BLOOD PRESSURE SUPPORT)
PREFILLED_SYRINGE | INTRAVENOUS | Status: AC
  Filled 2017-01-16: qty 20

## 2017-01-16 MED ORDER — DEXTROSE 5 % IV SOLN
5.0000 10*6.[IU] | Freq: Once | INTRAVENOUS | Status: AC
  Administered 2017-01-16: 5 10*6.[IU] via INTRAVENOUS
  Filled 2017-01-16: qty 5

## 2017-01-16 MED ORDER — LACTATED RINGERS IV SOLN
INTRAVENOUS | Status: DC
  Administered 2017-01-16 – 2017-01-17 (×3): via INTRAVENOUS

## 2017-01-16 MED ORDER — LIDOCAINE HCL (PF) 1 % IJ SOLN
30.0000 mL | INTRAMUSCULAR | Status: DC | PRN
  Administered 2017-01-17: 30 mL via SUBCUTANEOUS
  Filled 2017-01-16: qty 30

## 2017-01-16 MED ORDER — FENTANYL CITRATE (PF) 100 MCG/2ML IJ SOLN: 100.0000 ug | INTRAMUSCULAR | Status: DC | PRN

## 2017-01-16 MED ORDER — ONDANSETRON HCL 4 MG/2ML IJ SOLN: 4.0000 mg | Freq: Four times a day (QID) | INTRAMUSCULAR | Status: DC | PRN

## 2017-01-16 MED ORDER — OXYTOCIN 40 UNITS IN LACTATED RINGERS INFUSION - SIMPLE MED
2.5000 [IU]/h | INTRAVENOUS | Status: DC
  Filled 2017-01-16: qty 1000

## 2017-01-16 MED ORDER — ACETAMINOPHEN 325 MG PO TABS: 650.0000 mg | ORAL_TABLET | ORAL | Status: DC | PRN

## 2017-01-16 MED ORDER — MISOPROSTOL 200 MCG PO TABS
50.0000 ug | ORAL_TABLET | ORAL | Status: DC | PRN
  Administered 2017-01-16: 50 ug via ORAL
  Filled 2017-01-16: qty 1

## 2017-01-16 MED ORDER — OXYCODONE-ACETAMINOPHEN 5-325 MG PO TABS: 2.0000 | ORAL_TABLET | ORAL | Status: DC | PRN

## 2017-01-16 MED ORDER — PENICILLIN G POT IN DEXTROSE 60000 UNIT/ML IV SOLN
3.0000 10*6.[IU] | INTRAVENOUS | Status: DC
  Administered 2017-01-17 (×3): 3 10*6.[IU] via INTRAVENOUS
  Filled 2017-01-16 (×7): qty 50

## 2017-01-16 MED ORDER — OXYTOCIN BOLUS FROM INFUSION
500.0000 mL | Freq: Once | INTRAVENOUS | Status: AC
  Administered 2017-01-17: 500 mL via INTRAVENOUS

## 2017-01-16 MED ORDER — SOD CITRATE-CITRIC ACID 500-334 MG/5ML PO SOLN: 30.0000 mL | ORAL | Status: DC | PRN

## 2017-01-16 MED ORDER — LACTATED RINGERS IV SOLN: 500.0000 mL | INTRAVENOUS | Status: DC | PRN

## 2017-01-16 MED ORDER — OXYCODONE-ACETAMINOPHEN 5-325 MG PO TABS
1.0000 | ORAL_TABLET | ORAL | Status: DC | PRN
Start: 2017-01-16 — End: 2017-01-17

## 2017-01-16 MED ORDER — FLEET ENEMA 7-19 GM/118ML RE ENEM: 1.0000 | ENEMA | RECTAL | Status: DC | PRN

## 2017-01-16 NOTE — Progress Notes (Signed)
   PRENATAL VISIT NOTE  Subjective:  Lisa Charles is a 31 y.o. G2P1001 at 6085w6d being seen today for ongoing prenatal care.  She is currently monitored for the following issues for this high-risk pregnancy and has Hx of preeclampsia, prior pregnancy, currently pregnant; Encounter for supervision of normal pregnancy, antepartum; Chronic hypertension during pregnancy, antepartum; and GBS (group B Streptococcus carrier), +RV culture, currently pregnant on their problem list.  Patient reports no complaints.  Contractions: Not present. Vag. Bleeding: None.  Movement: Present. Denies leaking of fluid.   The following portions of the patient's history were reviewed and updated as appropriate: allergies, current medications, past family history, past medical history, past social history, past surgical history and problem list. Problem list updated.  Objective:   Vitals:   01/16/17 1501  BP: (!) 147/82  Pulse: 83  Weight: (!) 314 lb (142.4 kg)    Fetal Status: Fetal Heart Rate (bpm): NST/AFI   Movement: Present     General:  Alert, oriented and cooperative. Patient is in no acute distress.  Skin: Skin is warm and dry. No rash noted.   Cardiovascular: Normal heart rate noted  Respiratory: Normal respiratory effort, no problems with respiration noted  Abdomen: Soft, gravid, appropriate for gestational age.  Pain/Pressure: Present     Pelvic: Cervical exam deferred        Extremities: Normal range of motion.  Edema: None  Mental Status:  Normal mood and affect. Normal behavior. Normal judgment and thought content.   Assessment and Plan:  Pregnancy: G2P1001 at 2785w6d  1. Supervision of other normal pregnancy, antepartum Patient is doing well without complaints GBS positive- will provide prophylaxis in labor  2. Hx of preeclampsia, prior pregnancy, currently pregnant Currently asymptomatic  3. Chronic hypertension during pregnancy, antepartum No meds BP elevated today. Upon chart,  patient reports a history of preeclampsia with last pregnancy and denies a diagnosis of CHTN afterwards. Patient with normal BP throughout pregnancy except today. Patient to be sent to MAU for further evaluation Per MFM, IOL at 39 weeks scheduled.  Term labor symptoms and general obstetric precautions including but not limited to vaginal bleeding, contractions, leaking of fluid and fetal movement were reviewed in detail with the patient. Please refer to After Visit Summary for other counseling recommendations.  Return in about 1 week (around 01/23/2017) for ROB.   Catalina AntiguaPeggy Anzlee Hinesley, MD

## 2017-01-16 NOTE — Anesthesia Pain Management Evaluation Note (Signed)
  CRNA Pain Management Visit Note  Patient: Lisa Charles, 31 y.o., female  "Hello [facility] am a [facility]. We have an anesthesia team available at all times to provide care throughout the hospital, including epidural management and anesthesia for C-section. I don't know your plan for the delivery whether it a natural birth, water birth, IV sedation, nitrous supplementation, doula or epidural, but we want to meet your pain goals."   1.Was your pain managed to your expectations on prior hospitalizations?   Yes   2.What is your expectation for pain management during this hospitalization?     Epidural  3.How can we help you reach that goal? Epidural when ready.  Record the patient's initial score and the patient's pain goal.   Pain: 0  Pain Goal: 8 [facility] wants you to be able to say your pain was always managed very well.  Pal Shell L 01/16/2017

## 2017-01-16 NOTE — MAU Note (Signed)
Pt sent to MAU from MD office for BP eval.  Pt denies H/A, visual disturbances, or epigastric pain.  1+DTR's, no clonus, & no swelling.

## 2017-01-16 NOTE — Progress Notes (Signed)
Labor Progress Note Lisa Charles is a 31 y.o. G2P1001 at 5545w6d presented for here for IOL gHTN S: No complaints  O:  BP (!) 134/46   Pulse 77   Temp 97.8 F (36.6 C) (Oral)   Resp 18   LMP 04/22/2016   SpO2 100%  EFM: 150/mod var/no decels/pos acelse  CVE: Dilation: 3 Effacement (%): 50 Station: -3 Presentation: Vertex Exam by:: Dr. Rachelle HoraMoss    A&P: 31 y.o. G2P1001 4945w6d here for IOL gHTN #Labor: Foley bulb placed. cytotec q4hrprn. # GBS pos-PCN   Lisa Westwood, DO 9:00 PM

## 2017-01-16 NOTE — H&P (Signed)
OBSTETRIC ADMISSION HISTORY AND PHYSICAL  Lisa Charles is a 31 y.o. G2P1001 at 5872w6d who presents from the office for evaluation of elevated blood pressures. She denies any headache, visual changes or epigastric pain. She reports no vaginal bleeding, discharge or leaking of fluid. Reports good fetal movement.  She plans on breast/bottle feeding. She request undecided for birth control. [facility]  Dating: By u/s --->  Estimated Date of Delivery: 01/31/17   Clinic [facility] Prenatal Labs  Dating U/S Blood type: A/Positive/-- (05/02 1604)   Genetic Screen 1 Screen:    AFP:     Quad:     NIPS: Antibody:Negative (05/02 1604)neg  Anatomic US normal Rubella: 7.94 (05/02 1604)imm  GTT Third trimester: normal RPR: Non Reactive (09/05 1030)   Flu vaccine undecided HBsAg: Negative (05/02 1604) neg  TDaP vaccine 11-21-16                                  Rhogam: n/a HIV:   NR  Baby Food Breast/Bottle                                           GBS: (For PCN allergy, check sensitivities)  Contraception undecided Pap: Negative 07/06/16  Circumcision    Wants to do   [facility]   Support Person FOB    Prenatal History/Complications:  Past Medical History: Past Medical History:  Diagnosis Date  . Bronchitis   . Pre-eclampsia     Past Surgical History: Past Surgical History:  Procedure Laterality Date  . NO PAST SURGERIES      Obstetrical History: OB History    Gravida Para Term Preterm AB Living   2 1 1  0 0 1   SAB TAB Ectopic Multiple Live Births   0 0 0 0 1      Social History: Social History   Socioeconomic History  . Marital status: Single    Spouse name: None  . Number of children: None  . Years of education: None  . Highest education level: None  Social Needs  . Financial resource strain: None  . Food insecurity - worry: None  . Food insecurity - inability: None  . Transportation needs - medical: None  .  Transportation needs - non-medical: None  Occupational History  . None  Tobacco Use  . Smoking status: Former Smoker    Last attempt to quit: 02/2016    Years since quitting: 0.9  . Smokeless tobacco: Former Engineer, waterUser  Substance and Sexual Activity  . Alcohol use: No  . Drug use: No  . Sexual activity: Yes  Other Topics Concern  . None  Social History Narrative  . None    Family History: Family History  Problem Relation Age of Onset  . Diabetes Mother   . Hypertension Mother   . Hyperlipidemia Mother   . Gout Father     Allergies: No Known Allergies  Medications Prior to Admission  Medication Sig Dispense Refill Last Dose  . acetaminophen (TYLENOL) 500 MG tablet Take 500 mg by mouth every 4 (four) hours as needed for moderate pain.   01/15/2017 at Unknown time  . Prenatal Vit-Fe Fumarate-FA (MULTIVITAMIN-PRENATAL) 27-0.8 MG TABS tablet Take 1 tablet by mouth daily at 12 noon.   01/15/2017  at Unknown time  . aspirin 81 MG tablet Take 1 tablet (81 mg total) by mouth daily. 30 tablet 11 01/02/2017  . cromolyn (NASALCROM) 5.2 MG/ACT nasal spray Place 1 spray into both nostrils 4 (four) times daily. (Patient not taking: Reported on 11/21/2016) 26 mL 0 Not Taking  . cyclobenzaprine (FLEXERIL) 10 MG tablet Take 1 tablet (10 mg total) by mouth 2 (two) times daily as needed for muscle spasms. (Patient not taking: Reported on 01/16/2017) 20 tablet 0 Not Taking     Review of Systems   All systems reviewed and negative except as stated in HPI  Blood pressure 138/78, pulse 72, temperature 97.8 F (36.6 C), temperature source Oral, resp. rate 18, last menstrual period 04/22/2016, SpO2 100 %. General appearance: alert, cooperative and no distress Lungs: clear to auscultation bilaterally Heart: regular rate and rhythm Abdomen: soft, non-tender; bowel sounds normal Pelvic: n/a Extremities: Homans sign is negative, no sign of DVT DTR's +1 Presentation: cephalic  Fetal  Tracing:  Baseline: 120 bpm Variability: moderate Accels: 15x15 Decels: none  Toco: occasional uc's        Prenatal labs: ABO, Rh: A/Positive/-- (05/02 1604) Antibody: Negative (05/02 1604) Rubella: 7.94 (05/02 1604) RPR: Non Reactive (09/05 1030)  HBsAg: Negative (05/02 1604)  HIV:    GBS: Positive (10/30 1614)   Prenatal Transfer Tool  Maternal Diabetes: No Genetic Screening: Normal Maternal Ultrasounds/Referrals: Normal Fetal Ultrasounds or other Referrals:  None Maternal Substance Abuse:  No Significant Maternal Medications:  None Significant Maternal Lab Results: Lab values include: Group B Strep positive  Results for orders placed or performed during the hospital encounter of 01/16/17 (from the past 24 hour(s))  Protein / creatinine ratio, urine   Collection Time: 01/16/17  3:35 PM  Result Value Ref Range   Creatinine, Urine 48.00 mg/dL   Total Protein, Urine 6 mg/dL   Protein Creatinine Ratio 0.13 0.00 - 0.15 mg/mg[Cre]  Urinalysis, Routine w reflex microscopic   Collection Time: 01/16/17  3:35 PM  Result Value Ref Range   Color, Urine STRAW (A) YELLOW   APPearance CLEAR CLEAR   Specific Gravity, Urine 1.010 1.005 - 1.030   pH 7.0 5.0 - 8.0   Glucose, UA NEGATIVE NEGATIVE mg/dL   Hgb urine dipstick NEGATIVE NEGATIVE   Bilirubin Urine NEGATIVE NEGATIVE   Ketones, ur NEGATIVE NEGATIVE mg/dL   Protein, ur NEGATIVE NEGATIVE mg/dL   Nitrite NEGATIVE NEGATIVE   Leukocytes, UA LARGE (A) NEGATIVE   RBC / HPF 0-5 0 - 5 RBC/hpf   WBC, UA 0-5 0 - 5 WBC/hpf   Bacteria, UA RARE (A) NONE SEEN   Squamous Epithelial / LPF 0-5 (A) NONE SEEN   Mucus PRESENT   CBC   Collection Time: 01/16/17  4:28 PM  Result Value Ref Range   WBC 9.9 4.0 - 10.5 K/uL   RBC 3.79 (L) 3.87 - 5.11 MIL/uL   Hemoglobin 11.2 (L) 12.0 - 15.0 g/dL   HCT 16.1 (L) 09.6 - 04.5 %   MCV 83.4 78.0 - 100.0 fL   MCH 29.6 26.0 - 34.0 pg   MCHC 35.4 30.0 - 36.0 g/dL   RDW 40.9 81.1 - 91.4 %    Platelets 195 150 - 400 K/uL  Comprehensive metabolic panel   Collection Time: 01/16/17  4:28 PM  Result Value Ref Range   Sodium 137 135 - 145 mmol/L   Potassium 4.0 3.5 - 5.1 mmol/L   Chloride 106 101 - 111 mmol/L   CO2 23  22 - 32 mmol/L   Glucose, Bld 82 65 - 99 mg/dL   BUN 8 6 - 20 mg/dL   Creatinine, Ser 8.650.58 0.44 - 1.00 mg/dL   Calcium 9.5 8.9 - 78.410.3 mg/dL   Total Protein 7.6 6.5 - 8.1 g/dL   Albumin 3.4 (L) 3.5 - 5.0 g/dL   AST 23 15 - 41 U/L   ALT 16 14 - 54 U/L   Alkaline Phosphatase 180 (H) 38 - 126 U/L   Total Bilirubin 0.4 0.3 - 1.2 mg/dL   GFR calc non Af Amer >60 >60 mL/min   GFR calc Af Amer >60 >60 mL/min   Anion gap 8 5 - 15    Patient Active Problem List   Diagnosis Date Noted  . Chronic hypertension during pregnancy, antepartum 01/16/2017  . GBS (group B Streptococcus carrier), +RV culture, currently pregnant 01/16/2017  . Encounter for supervision of normal pregnancy, antepartum 07/06/2016  . Hx of preeclampsia, prior pregnancy, currently pregnant 02/20/2012    Assessment/Plan:  Lisa Charles is a 31 y.o. G2P1001 at 6778w6d here for IOL for gHTN. Pre-e labs wnl, asymptomatic  #Labor: plan cytotec and foley bulb when able #Pain: Plans epidural #FWB: Cat 1 #ID:  GBS pos #MOF: Breast/bottle #MOC: unsure #Circ:  Yes, outpatient  Rolm Bookbinderaroline M Neill, CNM  01/16/2017, 5:35 PM

## 2017-01-17 ENCOUNTER — Inpatient Hospital Stay (HOSPITAL_COMMUNITY): Payer: Medicaid Other | Admitting: Anesthesiology

## 2017-01-17 ENCOUNTER — Encounter (HOSPITAL_COMMUNITY): Payer: Self-pay | Admitting: *Deleted

## 2017-01-17 DIAGNOSIS — Z3A37 37 weeks gestation of pregnancy: Secondary | ICD-10-CM

## 2017-01-17 DIAGNOSIS — O134 Gestational [pregnancy-induced] hypertension without significant proteinuria, complicating childbirth: Secondary | ICD-10-CM

## 2017-01-17 LAB — CBC
HCT: 33.7 % — ABNORMAL LOW (ref 36.0–46.0)
HEMATOCRIT: 33.4 % — AB (ref 36.0–46.0)
HEMOGLOBIN: 11 g/dL — AB (ref 12.0–15.0)
Hemoglobin: 11.2 g/dL — ABNORMAL LOW (ref 12.0–15.0)
MCH: 27.4 pg (ref 26.0–34.0)
MCH: 27.7 pg (ref 26.0–34.0)
MCHC: 32.9 g/dL (ref 30.0–36.0)
MCHC: 33.2 g/dL (ref 30.0–36.0)
MCV: 83.3 fL (ref 78.0–100.0)
MCV: 83.4 fL (ref 78.0–100.0)
PLATELETS: 201 10*3/uL (ref 150–400)
Platelets: 230 10*3/uL (ref 150–400)
RBC: 4.01 MIL/uL (ref 3.87–5.11)
RBC: 4.04 MIL/uL (ref 3.87–5.11)
RDW: 15.1 % (ref 11.5–15.5)
RDW: 15.1 % (ref 11.5–15.5)
WBC: 13.2 10*3/uL — AB (ref 4.0–10.5)
WBC: 9.8 10*3/uL (ref 4.0–10.5)

## 2017-01-17 LAB — RPR: RPR: NONREACTIVE

## 2017-01-17 MED ORDER — PHENYLEPHRINE 40 MCG/ML (10ML) SYRINGE FOR IV PUSH (FOR BLOOD PRESSURE SUPPORT)
80.0000 ug | PREFILLED_SYRINGE | INTRAVENOUS | Status: DC | PRN
  Filled 2017-01-17: qty 5

## 2017-01-17 MED ORDER — FENTANYL 2.5 MCG/ML BUPIVACAINE 1/10 % EPIDURAL INFUSION (WH - ANES)
14.0000 mL/h | INTRAMUSCULAR | Status: DC | PRN
  Administered 2017-01-17: 14 mL/h via EPIDURAL
  Filled 2017-01-17: qty 100

## 2017-01-17 MED ORDER — WITCH HAZEL-GLYCERIN EX PADS: 1.0000 "application " | MEDICATED_PAD | CUTANEOUS | Status: DC | PRN

## 2017-01-17 MED ORDER — LIDOCAINE HCL (PF) 1 % IJ SOLN
INTRAMUSCULAR | Status: DC | PRN
  Administered 2017-01-17: 5 mL
  Administered 2017-01-17: 2 mL
  Administered 2017-01-17: 3 mL

## 2017-01-17 MED ORDER — TETANUS-DIPHTH-ACELL PERTUSSIS 5-2.5-18.5 LF-MCG/0.5 IM SUSP: 0.5000 mL | Freq: Once | INTRAMUSCULAR | Status: DC

## 2017-01-17 MED ORDER — SIMETHICONE 80 MG PO CHEW: 80.0000 mg | CHEWABLE_TABLET | ORAL | Status: DC | PRN

## 2017-01-17 MED ORDER — TERBUTALINE SULFATE 1 MG/ML IJ SOLN
0.2500 mg | Freq: Once | INTRAMUSCULAR | Status: DC | PRN
  Filled 2017-01-17: qty 1

## 2017-01-17 MED ORDER — BENZOCAINE-MENTHOL 20-0.5 % EX AERO
1.0000 "application " | INHALATION_SPRAY | CUTANEOUS | Status: DC | PRN
  Administered 2017-01-17: 1 via TOPICAL
  Filled 2017-01-17: qty 56

## 2017-01-17 MED ORDER — ZOLPIDEM TARTRATE 5 MG PO TABS: 5.0000 mg | ORAL_TABLET | Freq: Every evening | ORAL | Status: DC | PRN

## 2017-01-17 MED ORDER — FENTANYL 2.5 MCG/ML BUPIVACAINE 1/10 % EPIDURAL INFUSION (WH - ANES)
INTRAMUSCULAR | Status: DC | PRN
  Administered 2017-01-17: 14 mL/h via EPIDURAL

## 2017-01-17 MED ORDER — LACTATED RINGERS IV SOLN
500.0000 mL | Freq: Once | INTRAVENOUS | Status: AC
  Administered 2017-01-17: 500 mL via INTRAVENOUS

## 2017-01-17 MED ORDER — DIBUCAINE 1 % RE OINT: 1.0000 "application " | TOPICAL_OINTMENT | RECTAL | Status: DC | PRN

## 2017-01-17 MED ORDER — PRENATAL MULTIVITAMIN CH
1.0000 | ORAL_TABLET | Freq: Every day | ORAL | Status: DC
  Administered 2017-01-18: 1 via ORAL
  Filled 2017-01-17: qty 1

## 2017-01-17 MED ORDER — ACETAMINOPHEN 325 MG PO TABS: 650.0000 mg | ORAL_TABLET | ORAL | Status: DC | PRN

## 2017-01-17 MED ORDER — COCONUT OIL OIL: 1.0000 "application " | TOPICAL_OIL | Status: DC | PRN

## 2017-01-17 MED ORDER — ONDANSETRON HCL 4 MG PO TABS
4.0000 mg | ORAL_TABLET | ORAL | Status: DC | PRN
Start: 2017-01-17 — End: 2017-01-19

## 2017-01-17 MED ORDER — ONDANSETRON HCL 4 MG/2ML IJ SOLN: 4.0000 mg | INTRAMUSCULAR | Status: DC | PRN

## 2017-01-17 MED ORDER — DIPHENHYDRAMINE HCL 25 MG PO CAPS: 25.0000 mg | ORAL_CAPSULE | Freq: Four times a day (QID) | ORAL | Status: DC | PRN

## 2017-01-17 MED ORDER — INFLUENZA VAC SPLIT QUAD 0.5 ML IM SUSY
0.5000 mL | PREFILLED_SYRINGE | INTRAMUSCULAR | Status: AC
  Administered 2017-01-18: 0.5 mL via INTRAMUSCULAR
  Filled 2017-01-17: qty 0.5

## 2017-01-17 MED ORDER — OXYTOCIN 40 UNITS IN LACTATED RINGERS INFUSION - SIMPLE MED
1.0000 m[IU]/min | INTRAVENOUS | Status: DC
  Administered 2017-01-17: 2 m[IU]/min via INTRAVENOUS

## 2017-01-17 MED ORDER — IBUPROFEN 600 MG PO TABS
600.0000 mg | ORAL_TABLET | Freq: Four times a day (QID) | ORAL | Status: DC
  Administered 2017-01-17 – 2017-01-19 (×7): 600 mg via ORAL
  Filled 2017-01-17 (×7): qty 1

## 2017-01-17 MED ORDER — LACTATED RINGERS IV SOLN
INTRAVENOUS | Status: DC
  Administered 2017-01-17: 13:00:00 via INTRAUTERINE

## 2017-01-17 MED ORDER — EPHEDRINE 5 MG/ML INJ
10.0000 mg | INTRAVENOUS | Status: DC | PRN
  Filled 2017-01-17: qty 2

## 2017-01-17 MED ORDER — SENNOSIDES-DOCUSATE SODIUM 8.6-50 MG PO TABS
2.0000 | ORAL_TABLET | ORAL | Status: DC
  Administered 2017-01-18: 2 via ORAL
  Filled 2017-01-17: qty 2

## 2017-01-17 NOTE — Anesthesia Procedure Notes (Signed)
Epidural Patient location during procedure: OB Start time: 01/17/2017 12:18 AM End time: 01/17/2017 12:27 AM  Staffing Anesthesiologist: Marcene DuosFitzgerald, Saydee Zolman, MD Performed: anesthesiologist   Preanesthetic Checklist Completed: patient identified, site marked, surgical consent, pre-op evaluation, timeout performed, IV checked, risks and benefits discussed and monitors and equipment checked  Epidural Patient position: sitting Prep: site prepped and draped and DuraPrep Patient monitoring: continuous pulse ox and blood pressure Approach: midline Location: L3-L4 Injection technique: LOR air  Needle:  Needle type: Tuohy  Needle gauge: 17 G Needle length: 9 cm and 9 Needle insertion depth: 9 cm Catheter type: closed end flexible Catheter size: 19 Gauge Catheter at skin depth: 17 cm Test dose: negative  Assessment Events: blood not aspirated, injection not painful, no injection resistance, negative IV test and no paresthesia

## 2017-01-17 NOTE — Anesthesia Preprocedure Evaluation (Signed)
Anesthesia Evaluation  Patient identified by MRN, date of birth, ID band Patient awake    Reviewed: Allergy & Precautions, Patient's Chart, lab work & pertinent test results  Airway Mallampati: III       Dental   Pulmonary former smoker,    Pulmonary exam normal        Cardiovascular hypertension (Pre-E), Normal cardiovascular exam     Neuro/Psych negative neurological ROS     GI/Hepatic negative GI ROS, Neg liver ROS,   Endo/Other  Morbid obesity  Renal/GU negative Renal ROS     Musculoskeletal   Abdominal   Peds  Hematology negative hematology ROS (+)   Anesthesia Other Findings   Reproductive/Obstetrics (+) Pregnancy                             Lab Results  Component Value Date   WBC 9.8 01/16/2017   HGB 11.0 (L) 01/16/2017   HCT 33.4 (L) 01/16/2017   MCV 83.3 01/16/2017   PLT 230 01/16/2017   Lab Results  Component Value Date   CREATININE 0.58 01/16/2017   BUN 8 01/16/2017   NA 137 01/16/2017   K 4.0 01/16/2017   CL 106 01/16/2017   CO2 23 01/16/2017    Anesthesia Physical Anesthesia Plan  ASA: III  Anesthesia Plan: Epidural   Post-op Pain Management:    Induction:   PONV Risk Score and Plan: Treatment may vary due to age or medical condition  Airway Management Planned: Natural Airway  Additional Equipment:   Intra-op Plan:   Post-operative Plan:   Informed Consent: I have reviewed the patients History and Physical, chart, labs and discussed the procedure including the risks, benefits and alternatives for the proposed anesthesia with the patient or authorized representative who has indicated his/her understanding and acceptance.     Plan Discussed with:   Anesthesia Plan Comments:         Anesthesia Quick Evaluation

## 2017-01-17 NOTE — Plan of Care (Signed)
Progressing appropriately.

## 2017-01-17 NOTE — Progress Notes (Signed)
Patient is a 31 y.o. now G2P2 s/p NSVD at 599w0d, who was admitted for IOL of gHTN  Delivery Note At 1:08 PM a viable female was delivered via Vaginal, Spontaneous (Presentation:cephalic ;LOA  ).  APGAR: 8, 9; weight pending   Placenta status:intact  Cord: 3 vessel  with the following complications: none    Anesthesia:  epidural Episiotomy: None Lacerations: 1st degree;Perineal, left labial (hemostatic) Suture Repair: 3.0 vicryl Est. Blood Loss (mL):  150  Mom to postpartum.  Baby to Couplet care / Skin to Skin.   Head delivered LOA. Nuchal/body cord present and reduced. Shoulder and body delivered in usual fashion. Infant with spontaneous cry, placed on mother's abdomen, dried and bulb suctioned. Cord clamped x 2 after 1-minute delay, and cut by family member. Cord blood drawn. Placenta delivered spontaneously with gentle cord traction. Fundus firm with massage and Pitocin. Perineum inspected and found to have 1st degree perineal laceration, which was repaired with 3.0 vicryl with good hemostasis achieved. Also found to have a left labial laceration which was found to be hemostatic.  Teodoro KilKerrriann S. Minott,  MD Family Medicine Resident PGY-1 01/17/17, 1:33 PM  OB FELLOW DELIVERY ATTESTATION  I was gloved and present for the delivery in its entirety, and I agree with the above resident's note.    Frederik PearJulie P Quindell Shere, MD OB Fellow

## 2017-01-17 NOTE — Progress Notes (Signed)
LABOR PROGRESS NOTE  Lisa Charles is a 31 y.o. G2P1001 at 2060w0d  admitted for IOL for gHTN  Subjective: Doing well, comfortable with epdiural  Objective: BP 131/83   Pulse 78   Temp 98.6 F (37 C) (Oral)   Resp 16   LMP 04/22/2016   SpO2 100%  or  Vitals:   01/17/17 0731 01/17/17 0801 01/17/17 0831 01/17/17 0931  BP: 128/85 119/78 107/70 131/83  Pulse: 77 85 78 78  Resp:  16 16 16   Temp:      TempSrc:      SpO2:        SVE: Dilation: 5 Effacement (%): 70 Cervical Position: Middle Station: -3 Presentation: Vertex Exam by:: Irving BurtonEmily Rothermel RN  FHT: baseline rate 140, moderate varibility, +acel, early decel Toco: MVU ~190-200   Assessment / Plan: 31 y.o. G2P1001 at 760w0d here for IOL for ghtn  Labor: IUPC placed. Continue Pit Fetal Wellbeing:  Cat I Pain Control:  Epidural Anticipated MOD:  SVD  Frederik PearJulie P Lavaeh Bau, MD 01/17/2017, 9:41 AM

## 2017-01-18 NOTE — Progress Notes (Signed)
POSTPARTUM PROGRESS NOTE  Post Partum Day 1  Subjective:  Lisa Charles is a 31 y.o. [id number] s/p SVD at 1074w0d.  No acute events overnight.  Pt denies problems with ambulating, voiding or po intake.  She denies nausea or vomiting.  Pain is well controlled.  She has had flatus. She has not had bowel movement.  Lochia Small. Doesn't think she is producing enough milk.  Objective: Blood pressure 131/72, pulse 64, temperature 97.6 F (36.4 C), temperature source Oral, resp. rate 18, last menstrual period 04/22/2016, SpO2 100 %, unknown if currently breastfeeding.  Physical Exam:  General: alert, cooperative and no distress Chest: no respiratory distress Heart:regular rate, distal pulses intact Abdomen: soft, nontender,  Uterine Fundus: firm, appropriately tender DVT Evaluation: No calf swelling or tenderness Extremities: no edema   Recent Labs    01/16/17 2350 01/17/17 1343  HGB 11.0* 11.2*  HCT 33.4* 33.7*    Assessment/Plan: Lisa Charles is a 31 y.o. [id number] s/p SVD at 7674w0d   PPD#2 - Doing well.  Contraception: Abstinence Feeding: Both. Have LC speak with her today about ways to increase milk production. Dispo: Plan for discharge tomorrow.  Sanjuana LettersMohamed Bannie Lobban  Medical Student 01/18/2017, 6:32 AM

## 2017-01-18 NOTE — Anesthesia Postprocedure Evaluation (Signed)
Anesthesia Post Note  Patient: Lisa Charles  Procedure(s) Performed: AN AD HOC LABOR EPIDURAL     Patient location during evaluation: Mother Baby Anesthesia Type: Epidural Level of consciousness: awake Pain management: satisfactory to patient Vital Signs Assessment: post-procedure vital signs reviewed and stable Respiratory status: spontaneous breathing Cardiovascular status: stable Anesthetic complications: no    Last Vitals:  Vitals:   01/17/17 1950 01/18/17 0401  BP: (!) 145/64 131/72  Pulse: 82 64  Resp: 18 18  Temp: 36.6 C 36.4 C  SpO2:      Last Pain:  Vitals:   01/18/17 0829  TempSrc:   PainSc: 0-No pain   Pain Goal:                 KeyCorpBURGER,[name]

## 2017-01-18 NOTE — Progress Notes (Signed)
Post Partum Day 1;  Subjective: no complaints, up ad lib, voiding and tolerating PO  Objective: Blood pressure 131/72, pulse 64, temperature 97.6 F (36.4 C), temperature source Oral, resp. rate 18, last menstrual period 04/22/2016, SpO2 100 %, unknown if currently breastfeeding.  Physical Exam:  General: alert, cooperative and no distress Lochia: appropriate Uterine Fundus: firm Incision: NA DVT Evaluation: No evidence of DVT seen on physical exam.  Recent Labs    01/16/17 2350 01/17/17 1343  HGB 11.0* 11.2*  HCT 33.4* 33.7*    Assessment/Plan: Plan for discharge tomorrow Patient doing well; blood pressure normal today.   LOS: 2 days   Charlesetta GaribaldiKathryn Lorraine [facility] CenterKooistra 01/18/2017, 1:22 PM

## 2017-01-18 NOTE — Lactation Note (Signed)
This note was copied from a baby's chart. Lactation Consultation Note  Patient Name: Boy Lisa Charles QMVHQ'IToday's Date: 01/18/2017 Reason for consult: Initial assessment;Early term 37-38.6wks Breastfeeding consultation services and support information given and reviewed. Mom states she received assist with latching this AM and baby is doing well.  This is her second baby and newborn is 6022 hours old.  Mom breastfed her first baby.  Instructed to breastfeed with feeding cues and call for assist/concerns prn.  Maternal Data Has patient been taught Hand Expression?: Yes Does the patient have breastfeeding experience prior to this delivery?: Yes  Feeding Feeding Type: Breast Fed Length of feed: 5 min  LATCH Score Latch: (told mom to call out with next latch)                 Interventions    Lactation Tools Discussed/Used     Consult Status Consult Status: Follow-up Date: 01/19/17 Follow-up type: In-patient    Huston FoleyMOULDEN, Jaiveon Suppes S 01/18/2017, 11:12 AM

## 2017-01-19 ENCOUNTER — Other Ambulatory Visit: Payer: Medicaid Other

## 2017-01-19 MED ORDER — IBUPROFEN 600 MG PO TABS: 600.0000 mg | ORAL_TABLET | Freq: Four times a day (QID) | ORAL | 0 refills | Status: DC | PRN

## 2017-01-19 MED ORDER — SENNOSIDES-DOCUSATE SODIUM 8.6-50 MG PO TABS: 2.0000 | ORAL_TABLET | ORAL | 0 refills | Status: DC

## 2017-01-19 NOTE — Discharge Instructions (Signed)
Postpartum Care After Vaginal Delivery °The period of time right after you deliver your newborn is called the postpartum period. °What kind of medical care will I receive? °· You may continue to receive fluids and medicines through an IV tube inserted into one of your veins. °· If an incision was made near your vagina (episiotomy) or if you had some vaginal tearing during delivery, cold compresses may be placed on your episiotomy or your tear. This helps to reduce pain and swelling. °· You may be given a squirt bottle to use when you go to the bathroom. You may use this until you are comfortable wiping as usual. To use the squirt bottle, follow these steps: °? Before you urinate, fill the squirt bottle with warm water. Do not use hot water. °? After you urinate, while you are sitting on the toilet, use the squirt bottle to rinse the area around your urethra and vaginal opening. This rinses away any urine and blood. °? You may do this instead of wiping. As you start healing, you may use the squirt bottle before wiping yourself. Make sure to wipe gently. °? Fill the squirt bottle with clean water every time you use the bathroom. °· You will be given sanitary pads to wear. °How can I expect to feel? °· You may not feel the need to urinate for several hours after delivery. °· You will have some soreness and pain in your abdomen and vagina. °· If you are breastfeeding, you may have uterine contractions every time you breastfeed for up to several weeks postpartum. Uterine contractions help your uterus return to its normal size. °· It is normal to have vaginal bleeding (lochia) after delivery. The amount and appearance of lochia is often similar to a menstrual period in the first week after delivery. It will gradually decrease over the next few weeks to a dry, yellow-brown discharge. For most women, lochia stops completely by 6-8 weeks after delivery. Vaginal bleeding can vary from woman to woman. °· Within the first few  days after delivery, you may have breast engorgement. This is when your breasts feel heavy, full, and uncomfortable. Your breasts may also throb and feel hard, tightly stretched, warm, and tender. After this occurs, you may have milk leaking from your breasts. Your health care provider can help you relieve discomfort due to breast engorgement. Breast engorgement should go away within a few days. °· You may feel more sad or worried than normal due to hormonal changes after delivery. These feelings should not last more than a few days. If these feelings do not go away after several days, speak with your health care provider. °How should I care for myself? °· Tell your health care provider if you have pain or discomfort. °· Drink enough water to keep your urine clear or pale yellow. °· Wash your hands thoroughly with soap and water for at least 20 seconds after changing your sanitary pads, after using the toilet, and before holding or feeding your baby. °· If you are not breastfeeding, avoid touching your breasts a lot. Doing this can make your breasts produce more milk. °· If you become weak or lightheaded, or you feel like you might faint, ask for help before: °? Getting out of bed. °? Showering. °· Change your sanitary pads frequently. Watch for any changes in your flow, such as a sudden increase in volume, a change in color, the passing of large blood clots. If you pass a blood clot from your vagina, save it   to show to your health care provider. Do not flush blood clots down the toilet without having your health care provider look at them. °· Make sure that all your vaccinations are up to date. This can help protect you and your baby from getting certain diseases. You may need to have immunizations done before you leave the hospital. °· If desired, talk with your health care provider about methods of family planning or birth control (contraception). °How can I start bonding with my baby? °Spending as much time as  possible with your baby is very important. During this time, you and your baby can get to know each other and develop a bond. Having your baby stay with you in your room (rooming in) can give you time to get to know your baby. Rooming in can also help you become comfortable caring for your baby. Breastfeeding can also help you bond with your baby. °How can I plan for returning home with my baby? °· Make sure that you have a car seat installed in your vehicle. °? Your car seat should be checked by a certified car seat installer to make sure that it is installed safely. °? Make sure that your baby fits into the car seat safely. °· Ask your health care provider any questions you have about caring for yourself or your baby. Make sure that you are able to contact your health care provider with any questions after leaving the hospital. °This information is not intended to replace advice given to you by your health care provider. Make sure you discuss any questions you have with your health care provider. °Document Released: 12/19/2006 Document Revised: 07/27/2015 Document Reviewed: 01/26/2015 °Elsevier Interactive Patient Education © 2018 [employer] ° °

## 2017-01-19 NOTE — Lactation Note (Signed)
This note was copied from a baby's chart. Lactation Consultation Note  Patient Name: Lisa Charles Reason for consult: Follow-up assessment;Early term 37-38.6wks   Follow up with mom of 44 hour old infant. Infant with 7 BF for 10-25 minutes, 1 void and 2 stools in last 24 hours. Infant weight 7 lb 3.2 oz with weight loss of 4% since birth. LATCH Scores 8-9.  Mom reports she feels BF is going well. She reports her breasts are feeling fuller and denies nipple pain/tendernes with latch. Mom reports she hand expresses before latching and colostrum is expressible. Enc mom to continue feeding infant STS 8-12 x in 24 hours at first feeding cues.   Reviewed I/O, Signs of dehydration in the infant, engorgement prevention/treatment and breast milk expression and storage. Mom was given manual pump for home use.   [facility], mom aware of BF Support Groups, LC phone # and OP Services. [facility] and has spoken with them.   Mom reports no questions/concerns at this time. [facility] at [facility].   Pacifier in the infant crib, discussed not using pacifier for the first 2 weeks and to meet the suckling needs of the infant at the breast. Mom voiced infant does not like pacifier anyway.    Maternal Data Has patient been taught Hand Expression?: Yes Does the patient have breastfeeding experience prior to this delivery?: Yes  Feeding Feeding Type: Breast Fed Length of feed: 18 min  LATCH Score                   Interventions    Lactation Tools Discussed/Used WIC Program: Yes   Consult Status Consult Status: Complete Follow-up type: Call as needed    Ed BlalockSharon S Hice Charles, 9:41 AM

## 2017-01-19 NOTE — Discharge Summary (Signed)
OB Discharge Summary     Patient Name: Lisa Charles DOB: Nov 26, 1985 MRN: [id number]  Date of admission: 01/16/2017 Delivering MD: Suella BroadMINOTT, KERIANN S   Date of discharge: 01/19/2017  Admitting diagnosis: 37wks Dr.Constant sent over for High BP Intrauterine pregnancy: 301w0d     Secondary diagnosis:  Active Problems:   Gestational hypertension   SVD (spontaneous vaginal delivery)  Additional problems:  Patient Active Problem List   Diagnosis Date Noted  . Chronic hypertension during pregnancy, antepartum 01/16/2017  . GBS (group B Streptococcus carrier), +RV culture, currently pregnant 01/16/2017  . Gestational hypertension 01/16/2017  . Encounter for supervision of normal pregnancy, antepartum 07/06/2016  . Hx of preeclampsia, prior pregnancy, currently pregnant 02/20/2012      Discharge diagnosis: Term Pregnancy Delivered and Gestational Hypertension                                                                                                Post partum procedures:none  Augmentation: AROM, Pitocin and Cytotec  Complications: None  Hospital course:  Induction of Labor With Vaginal Delivery   31 y.o. yo G2P2002 at 101w0d was admitted to the hospital 01/16/2017 for induction of labor.  Indication for induction: Gestational hypertension.  Patient had an uncomplicated labor course as follows: Membrane Rupture Time/Date: 9:04 AM ,01/17/2017   Intrapartum Procedures: Episiotomy: None [1]                                         Lacerations:  1st degree [2];Perineal [11]  Patient had delivery of a Viable infant.  Information for the patient's newborn:  Marrian SalvageJones, Boy Estelene [[id number]][[id number]]  Delivery Method: Vaginal, Spontaneous(Filed from Delivery Summary)   01/17/2017  Details of delivery can be found in separate delivery note.  Patient had a routine postpartum course. Patient is discharged home 01/20/17.  Physical exam  Vitals:   01/17/17 1950 01/18/17 0401 01/18/17 1801  01/19/17 0500  BP: (!) 145/64 131/72 138/71 132/86  Pulse: 82 64 77 73  Resp: 18 18 18 18   Temp: 97.8 F (36.6 C) 97.6 F (36.4 C) 97.9 F (36.6 C) 98 F (36.7 C)  TempSrc: Oral Oral Oral Tympanic  SpO2:       General: alert, cooperative and no distress Lochia: appropriate Uterine Fundus: firm Incision: N/A DVT Evaluation: No evidence of DVT seen on physical exam. Labs: Lab Results  Component Value Date   WBC 13.2 (H) 01/17/2017   HGB 11.2 (L) 01/17/2017   HCT 33.7 (L) 01/17/2017   MCV 83.4 01/17/2017   PLT 201 01/17/2017   CMP Latest Ref Rng & Units 01/16/2017  Glucose 65 - 99 mg/dL 82  BUN 6 - 20 mg/dL 8  Creatinine 0.860.44 - 5.781.00 mg/dL 4.690.58  Sodium 629135 - 528145 mmol/L 137  Potassium 3.5 - 5.1 mmol/L 4.0  Chloride 101 - 111 mmol/L 106  CO2 22 - 32 mmol/L 23  Calcium 8.9 - 10.3 mg/dL 9.5  Total Protein 6.5 - 8.1 g/dL 7.6  Total Bilirubin  0.3 - 1.2 mg/dL 0.4  Alkaline Phos 38 - 126 U/L 180(H)  AST 15 - 41 U/L 23  ALT 14 - 54 U/L 16    Discharge instruction: per After Visit Summary and "Baby and Me Booklet".  After visit meds:  Allergies as of 01/19/2017   No Known Allergies     Medication List    TAKE these medications   acetaminophen 500 MG tablet Commonly known as:  TYLENOL Take 500 mg by mouth every 4 (four) hours as needed for moderate pain.   aspirin 81 MG tablet Take 1 tablet (81 mg total) by mouth daily.   cromolyn 5.2 MG/ACT nasal spray Commonly known as:  NASALCROM Place 1 spray into both nostrils 4 (four) times daily.   cyclobenzaprine 10 MG tablet Commonly known as:  FLEXERIL Take 1 tablet (10 mg total) by mouth 2 (two) times daily as needed for muscle spasms.   ibuprofen 600 MG tablet Commonly known as:  ADVIL,MOTRIN Take 1 tablet (600 mg total) every 6 (six) hours as needed by mouth.   multivitamin-prenatal 27-0.8 MG Tabs tablet Take 1 tablet by mouth daily at 12 noon.   senna-docusate 8.6-50 MG tablet Commonly known as:   Senokot-S Take 2 tablets daily by mouth.       Diet: routine diet  Activity: Advance as tolerated. Pelvic rest for 6 weeks.   Outpatient follow up:1 week for Bp check, 4 weeks  Follow up Appt: Future Appointments  Date Time Provider Department Center  01/23/2017  1:30 PM Constant, Gigi GinPeggy, MD [facility] None  01/24/2017  1:45 PM [facility] US 2 [facility]   Follow up Visit:No Follow-up on file.  Postpartum contraception: Abstinence  Newborn Data: Live born female  Birth Weight: 7 lb 8.1 oz (3405 g) APGAR: 8, 9  Newborn Delivery   Birth date/time:  01/17/2017 13:08:00 Delivery type:  Vaginal, Spontaneous     Baby Feeding: Bottle and Breast Disposition:home with mother   01/19/2017 Caryl AdaJazma Phelps, DO

## 2017-01-23 ENCOUNTER — Encounter: Payer: Medicaid Other | Admitting: Obstetrics

## 2017-01-23 ENCOUNTER — Other Ambulatory Visit: Payer: Medicaid Other

## 2017-01-23 ENCOUNTER — Encounter: Payer: Medicaid Other | Admitting: Obstetrics and Gynecology

## 2017-01-24 ENCOUNTER — Inpatient Hospital Stay (HOSPITAL_COMMUNITY): Payer: Medicaid Other

## 2017-01-24 ENCOUNTER — Other Ambulatory Visit (HOSPITAL_COMMUNITY): Payer: Medicaid Other

## 2017-01-25 ENCOUNTER — Ambulatory Visit: Payer: Medicaid Other

## 2017-01-25 VITALS — BP 143/88 | HR 69 | Wt 298.0 lb

## 2017-01-25 DIAGNOSIS — O139 Gestational [pregnancy-induced] hypertension without significant proteinuria, unspecified trimester: Secondary | ICD-10-CM

## 2017-01-25 MED ORDER — HYDROCHLOROTHIAZIDE 25 MG PO TABS: 25.0000 mg | ORAL_TABLET | Freq: Every day | ORAL | 3 refills | Status: DC

## 2017-01-25 NOTE — Progress Notes (Signed)
Does have a headache today postpartum, Denies any visual changes.  Reviewed with Dr. Debroah LoopArnold.  He prescribed HCTZ and repeat BP in one week.  Scheduled postpartum visit for four weeks out.  Patient states feeling well and baby is feeding well.

## 2017-02-01 ENCOUNTER — Ambulatory Visit: Payer: Medicaid Other

## 2017-02-01 VITALS — BP 139/84

## 2017-02-01 DIAGNOSIS — O10919 Unspecified pre-existing hypertension complicating pregnancy, unspecified trimester: Secondary | ICD-10-CM

## 2017-02-01 NOTE — Progress Notes (Signed)
Subjective:  Arva ChafeKendra D Ellerson is a 31 y.o. female with hypertension. Current Outpatient Medications  Medication Sig Dispense Refill  . hydrochlorothiazide (HYDRODIURIL) 25 MG tablet Take 1 tablet (25 mg total) by mouth daily. 30 tablet 3  . ibuprofen (ADVIL,MOTRIN) 600 MG tablet Take 1 tablet (600 mg total) every 6 (six) hours as needed by mouth. 30 tablet 0  . Prenatal Vit-Fe Fumarate-FA (MULTIVITAMIN-PRENATAL) 27-0.8 MG TABS tablet Take 1 tablet by mouth daily at 12 noon.    Marland Kitchen. acetaminophen (TYLENOL) 500 MG tablet Take 500 mg by mouth every 4 (four) hours as needed for moderate pain.     . cromolyn (NASALCROM) 5.2 MG/ACT nasal spray Place 1 spray into both nostrils 4 (four) times daily. (Patient not taking: Reported on 11/21/2016) 26 mL 0  . cyclobenzaprine (FLEXERIL) 10 MG tablet Take 1 tablet (10 mg total) by mouth 2 (two) times daily as needed for muscle spasms. (Patient not taking: Reported on 01/16/2017) 20 tablet 0  . senna-docusate (SENOKOT-S) 8.6-50 MG tablet Take 2 tablets daily by mouth. (Patient not taking: Reported on 01/25/2017) 6 tablet 0   No current facility-administered medications for this visit.     Hypertension ROS: taking medications as instructed, no medication side effects noted, no TIA's, no chest pain on exertion, no dyspnea on exertion and no swelling of ankles.  New concerns:  .   Objective:  LMP 04/22/2016   Appearance alert, well appearing, and in no distress. General exam BP noted to be well controlled today in office.    Assessment:   Hypertension stable.   Plan:  Current treatment plan is effective, no change in therapy..Marland Kitchen

## 2017-02-03 ENCOUNTER — Encounter (HOSPITAL_COMMUNITY): Payer: Self-pay | Admitting: Emergency Medicine

## 2017-02-03 DIAGNOSIS — M549 Dorsalgia, unspecified: Secondary | ICD-10-CM | POA: Diagnosis not present

## 2017-02-03 DIAGNOSIS — Z5321 Procedure and treatment not carried out due to patient leaving prior to being seen by health care provider: Secondary | ICD-10-CM | POA: Diagnosis not present

## 2017-02-03 LAB — URINALYSIS, ROUTINE W REFLEX MICROSCOPIC
Bilirubin Urine: NEGATIVE
Glucose, UA: NEGATIVE mg/dL
Ketones, ur: NEGATIVE mg/dL
NITRITE: NEGATIVE
Protein, ur: 30 mg/dL — AB
pH: 5.5 (ref 5.0–8.0)

## 2017-02-03 LAB — BASIC METABOLIC PANEL
ANION GAP: 10 (ref 5–15)
BUN: 13 mg/dL (ref 6–20)
CALCIUM: 9.5 mg/dL (ref 8.9–10.3)
CO2: 26 mmol/L (ref 22–32)
Chloride: 103 mmol/L (ref 101–111)
Creatinine, Ser: 0.83 mg/dL (ref 0.44–1.00)
GLUCOSE: 114 mg/dL — AB (ref 65–99)
Potassium: 3.4 mmol/L — ABNORMAL LOW (ref 3.5–5.1)
SODIUM: 139 mmol/L (ref 135–145)

## 2017-02-03 LAB — URINALYSIS, MICROSCOPIC (REFLEX)

## 2017-02-03 LAB — CBC
HCT: 38.6 % (ref 36.0–46.0)
HEMOGLOBIN: 12.2 g/dL (ref 12.0–15.0)
MCH: 26.6 pg (ref 26.0–34.0)
MCHC: 31.6 g/dL (ref 30.0–36.0)
MCV: 84.3 fL (ref 78.0–100.0)
Platelets: 358 10*3/uL (ref 150–400)
RBC: 4.58 MIL/uL (ref 3.87–5.11)
RDW: 15 % (ref 11.5–15.5)
WBC: 10.7 10*3/uL — ABNORMAL HIGH (ref 4.0–10.5)

## 2017-02-03 LAB — I-STAT BETA HCG BLOOD, ED (MC, WL, AP ONLY)

## 2017-02-03 MED ORDER — ACETAMINOPHEN 500 MG PO TABS
1000.0000 mg | ORAL_TABLET | Freq: Once | ORAL | Status: AC
  Administered 2017-02-03: 1000 mg via ORAL
  Filled 2017-02-03: qty 2

## 2017-02-03 NOTE — ED Triage Notes (Addendum)
Reports sudden onset of mid back/flank pain. Reports having a baby 17 days ago.  Denies having any back pain after having the baby.    During triage reports had the same pain during pregnancy and was given flexeril.

## 2017-02-04 ENCOUNTER — Emergency Department (HOSPITAL_COMMUNITY)
Admission: EM | Admit: 2017-02-04 | Discharge: 2017-02-04 | Disposition: A | Discharge status: Home / Self Care | Payer: Medicaid Other | Attending: Emergency Medicine | Admitting: Emergency Medicine

## 2017-02-04 NOTE — ED Notes (Signed)
Called for room, no answer

## 2017-02-09 ENCOUNTER — Encounter (HOSPITAL_COMMUNITY): Payer: Self-pay | Admitting: Emergency Medicine

## 2017-02-09 ENCOUNTER — Ambulatory Visit (HOSPITAL_COMMUNITY)
Admission: EM | Admit: 2017-02-09 | Discharge: 2017-02-09 | Disposition: A | Discharge status: Home / Self Care | Payer: Medicaid Other | Attending: Emergency Medicine | Admitting: Emergency Medicine

## 2017-02-09 ENCOUNTER — Ambulatory Visit (INDEPENDENT_AMBULATORY_CARE_PROVIDER_SITE_OTHER): Payer: Medicaid Other

## 2017-02-09 ENCOUNTER — Emergency Department (HOSPITAL_COMMUNITY)
Admission: EM | Admit: 2017-02-09 | Discharge: 2017-02-09 | Discharge status: Against Medical Advice | Payer: Medicaid Other | Attending: Emergency Medicine | Admitting: Emergency Medicine

## 2017-02-09 ENCOUNTER — Other Ambulatory Visit: Payer: Self-pay

## 2017-02-09 ENCOUNTER — Encounter (HOSPITAL_COMMUNITY): Payer: Self-pay

## 2017-02-09 ENCOUNTER — Telehealth: Payer: Self-pay

## 2017-02-09 DIAGNOSIS — R10821 Right upper quadrant rebound abdominal tenderness: Secondary | ICD-10-CM

## 2017-02-09 DIAGNOSIS — Z5321 Procedure and treatment not carried out due to patient leaving prior to being seen by health care provider: Secondary | ICD-10-CM | POA: Diagnosis not present

## 2017-02-09 DIAGNOSIS — R1013 Epigastric pain: Secondary | ICD-10-CM

## 2017-02-09 DIAGNOSIS — R109 Unspecified abdominal pain: Secondary | ICD-10-CM | POA: Diagnosis present

## 2017-02-09 LAB — CBC
HEMATOCRIT: 37.5 % (ref 36.0–46.0)
Hemoglobin: 12.1 g/dL (ref 12.0–15.0)
MCH: 26.9 pg (ref 26.0–34.0)
MCHC: 32.3 g/dL (ref 30.0–36.0)
MCV: 83.3 fL (ref 78.0–100.0)
PLATELETS: 334 10*3/uL (ref 150–400)
RBC: 4.5 MIL/uL (ref 3.87–5.11)
RDW: 14.3 % (ref 11.5–15.5)
WBC: 10.3 10*3/uL (ref 4.0–10.5)

## 2017-02-09 LAB — COMPREHENSIVE METABOLIC PANEL
ALBUMIN: 4.1 g/dL (ref 3.5–5.0)
ALK PHOS: 149 U/L — AB (ref 38–126)
ALT: 36 U/L (ref 14–54)
AST: 51 U/L — AB (ref 15–41)
Anion gap: 8 (ref 5–15)
BILIRUBIN TOTAL: 0.6 mg/dL (ref 0.3–1.2)
BUN: 13 mg/dL (ref 6–20)
CALCIUM: 9.3 mg/dL (ref 8.9–10.3)
CO2: 25 mmol/L (ref 22–32)
CREATININE: 0.9 mg/dL (ref 0.44–1.00)
Chloride: 104 mmol/L (ref 101–111)
GFR calc Af Amer: >60 mL/min (ref >60)
GLUCOSE: 96 mg/dL (ref 65–99)
POTASSIUM: 3.8 mmol/L (ref 3.5–5.1)
Sodium: 137 mmol/L (ref 135–145)
TOTAL PROTEIN: 7.8 g/dL (ref 6.5–8.1)

## 2017-02-09 LAB — URINALYSIS, ROUTINE W REFLEX MICROSCOPIC
BACTERIA UA: NONE SEEN
BILIRUBIN URINE: NEGATIVE
GLUCOSE, UA: NEGATIVE mg/dL
Ketones, ur: NEGATIVE mg/dL
Leukocytes, UA: NEGATIVE
NITRITE: NEGATIVE
PH: 6 (ref 5.0–8.0)
Protein, ur: NEGATIVE mg/dL
SPECIFIC GRAVITY, URINE: 1.015 (ref 1.005–1.030)

## 2017-02-09 LAB — LIPASE, BLOOD: Lipase: 27 U/L (ref 11–51)

## 2017-02-09 LAB — I-STAT BETA HCG BLOOD, ED (MC, WL, AP ONLY): I-stat hCG, quantitative: <5 m[IU]/mL (ref <5)

## 2017-02-09 MED ORDER — ACETAMINOPHEN 325 MG PO TABS
650.0000 mg | ORAL_TABLET | Freq: Once | ORAL | Status: AC | PRN
  Administered 2017-02-09: 650 mg via ORAL
  Filled 2017-02-09: qty 2

## 2017-02-09 MED ORDER — CYCLOBENZAPRINE HCL 10 MG PO TABS: 10.0000 mg | ORAL_TABLET | Freq: Three times a day (TID) | ORAL | 2 refills | Status: DC | PRN

## 2017-02-09 NOTE — ED Triage Notes (Signed)
Pt sts upper abd pain from back to front with some vomiting x 3 episodes since having her son one month ago; pt had labs drawn in ED but left and came here

## 2017-02-09 NOTE — ED Triage Notes (Addendum)
Patient complains of upper abdominal pain and back pain since 10am with nausea. Just gave birth 3 weeks ago and full term vaginal birth. Denies urinary symptoms. Refused meds because she is breast feeding. Patient crying and appears restless on assesment

## 2017-02-09 NOTE — ED Provider Notes (Addendum)
[facility]    CSN: [id number] Arrival date & time: 02/09/17  1456     History   Chief Complaint Chief Complaint  Patient presents with  . Abdominal Pain    HPI Lisa ChafeKendra D Charles is a 31 y.o. female.   31 year old obese female complaining of a sharp pain in the middle of her back that radiates bilaterally to the anterior abdomen. And occasionally associated vomiting. This happened one week before she delivered a baby 23 days ago, occurred 3 days ago and lasted for about 3 hours. The third occurrence occurred about 10:00 this morning and had one episode of vomiting. She went to the emergency department and lab work and after being told that there was a 4 hr wait  decided to come to the urgent care. Also states she has had intermittent spotting since she delivered. Lab work has been reviewed. Her hCG was below 5. There was microscopic hematuria and mildly elevated liver enzymes. Otherwise, labs were normal.      Past Medical History:  Diagnosis Date  . Bronchitis   . Pre-eclampsia     Patient Active Problem List   Diagnosis Date Noted  . SVD (spontaneous vaginal delivery) 01/20/2017  . Chronic hypertension during pregnancy, antepartum 01/16/2017  . GBS (group B Streptococcus carrier), +RV culture, currently pregnant 01/16/2017  . Gestational hypertension 01/16/2017  . Encounter for supervision of normal pregnancy, antepartum 07/06/2016  . Hx of preeclampsia, prior pregnancy, currently pregnant 02/20/2012    Past Surgical History:  Procedure Laterality Date  . NO PAST SURGERIES      OB History    Gravida Para Term Preterm AB Living   2 2 2  0 0 2   SAB TAB Ectopic Multiple Live Births   0 0 0 0 2       Home Medications    Prior to Admission medications   Medication Sig Start Date End Date Taking? Authorizing Provider  acetaminophen (TYLENOL) 500 MG tablet Take 500 mg by mouth every 4 (four) hours as needed for moderate pain.     [provider]    hydrochlorothiazide (HYDRODIURIL) 25 MG tablet Take 1 tablet (25 mg total) by mouth daily. 01/25/17   Adam PhenixArnold, James G, MD  ibuprofen (ADVIL,MOTRIN) 600 MG tablet Take 1 tablet (600 mg total) every 6 (six) hours as needed by mouth. 01/19/17   Lockamy, Timothy, DO  Prenatal Vit-Fe Fumarate-FA (MULTIVITAMIN-PRENATAL) 27-0.8 MG TABS tablet Take 1 tablet by mouth daily at 12 noon.    [provider]  senna-docusate (SENOKOT-S) 8.6-50 MG tablet Take 2 tablets daily by mouth. Patient not taking: Reported on 01/25/2017 01/19/17   Arlyce HarmanLockamy, Timothy, DO    Family History Family History  Problem Relation Age of Onset  . Diabetes Mother   . Hypertension Mother   . Hyperlipidemia Mother   . Gout Father     Social History Social History   Tobacco Use  . Smoking status: Former Smoker    Last attempt to quit: 02/2016    Years since quitting: 1.0  . Smokeless tobacco: Former Engineer, waterUser  Substance Use Topics  . Alcohol use: No  . Drug use: No     Allergies   Patient has no known allergies.   Review of Systems Review of Systems   Physical Exam Triage Vital Signs ED Triage Vitals  Enc Vitals Group     BP 02/09/17 1512 (!) 165/92     Pulse Rate 02/09/17 1512 75     Resp  02/09/17 1512 18     Temp 02/09/17 1512 97.9 F (36.6 C)     Temp Source 02/09/17 1512 Oral     SpO2 02/09/17 1512 98 %     Weight --      Height --      Head Circumference --      Peak Flow --      Pain Score 02/09/17 1513 8     Pain Loc --      Pain Edu? --      Excl. in GC? --    No data found.  Updated Vital Signs BP (!) 165/92 (BP Location: Right Arm)   Pulse 75   Temp 97.9 F (36.6 C) (Oral)   Resp 18   LMP 04/22/2016 (Exact Date)   SpO2 98%   Visual Acuity Right Eye Distance:   Left Eye Distance:   Bilateral Distance:    Right Eye Near:   Left Eye Near:    Bilateral Near:     Physical Exam   UC Treatments / Results  Labs (all labs ordered are listed, but only abnormal results  are displayed) Labs Reviewed - No data to display  EKG  EKG Interpretation None       Radiology Dg Abd 2 Views  Result Date: 02/09/2017 CLINICAL DATA:  Mid abdominal and back pain and sharp in caliber. Recent vaginal delivery 23 days ago. Patient reports vomiting occurs with episodes of pain. The patient reports no nausea, fever, or diarrhea. History of hypertension. EXAM: ABDOMEN - 2 VIEW COMPARISON:  None in PACs FINDINGS: The bowel gas pattern is normal. There are no abnormal soft tissue calcifications. There is gentle curvature of the mid lumbar spine convex toward the right. IMPRESSION: There is no acute intra-abdominal abnormality. Electronically Signed   By: Mariella Blackwelder  [place] M.D.   On: 02/09/2017 16:05    Procedures Procedures (including critical care time)  Medications Ordered in UC Medications - No data to display   Initial Impression / Assessment and Plan / UC Course  I have reviewed the triage vital signs and the nursing notes.  Pertinent labs & imaging results that were available during my care of the patient were reviewed by me and considered in my medical decision making (see chart for details).    Lab work mostly normal; liver enzymes are mildly elevated and there was some blood in your urinalysis however you have been having vaginal spotting which probably accounts for this. The x-ray does not reveal an obstruction or other abnormality. He may be having gallbladder problems. Your likely going to need an ultrasound of the abdomen. Call your gynecologist to see if she can get this ordered. If you have persistent and worsening pain and vomiting you should go back to the emergency department promptly.    Final Clinical Impressions(s) / UC Diagnoses   Final diagnoses:  Epigastric pain  Right upper quadrant abdominal tenderness with rebound tenderness    ED Discharge Orders    None       Controlled Substance Prescriptions  Controlled Substance Registry  consulted? Not Applicable   Hayden RasmussenMabe, Wm Fruchter, NP 02/09/17 1624    Hayden RasmussenMabe, Kristee Angus, NP 02/09/17 1625

## 2017-02-09 NOTE — Telephone Encounter (Signed)
Pt states that she has been having back spasms since delivery. States that she has tried taking tylenol with no relief. Pt also states that the pain is making her vomit. I advised pt to try taking ibuprofen in the meantime, and that I would send message to her provider.

## 2017-02-09 NOTE — Addendum Note (Signed)
Addended by: Catalina AntiguaONSTANT, Jhair Witherington on: 02/09/2017 03:28 PM   Modules accepted: Orders

## 2017-02-09 NOTE — Discharge Instructions (Signed)
lab work was essentially normal, a couple of liver enzymes were borderline Lab Lab work mostly normal; liver enzymes are mildly elevated and there was some blood in your urinalysis however you have been having vaginal spotting which probably accounts for this. The x-ray does not reveal an obstruction or other abnormality. He may be having gallbladder problems. Your likely going to need an ultrasound of the abdomen. Call your gynecologist to see if she can get this ordered. If you have persistent and worsening pain and vomiting you should go back to the emergency department promptly.

## 2017-02-14 NOTE — Telephone Encounter (Signed)
Pt informed of the above, and she verbalized understanding

## 2017-02-16 ENCOUNTER — Emergency Department (HOSPITAL_COMMUNITY)
Admission: EM | Admit: 2017-02-16 | Discharge: 2017-02-16 | Disposition: A | Discharge status: Home / Self Care | Payer: Medicaid Other | Attending: Emergency Medicine | Admitting: Emergency Medicine

## 2017-02-16 ENCOUNTER — Emergency Department (HOSPITAL_COMMUNITY): Payer: Medicaid Other

## 2017-02-16 ENCOUNTER — Encounter (HOSPITAL_COMMUNITY): Payer: Self-pay | Admitting: Emergency Medicine

## 2017-02-16 DIAGNOSIS — Z87891 Personal history of nicotine dependence: Secondary | ICD-10-CM | POA: Diagnosis not present

## 2017-02-16 DIAGNOSIS — K8050 Calculus of bile duct without cholangitis or cholecystitis without obstruction: Secondary | ICD-10-CM | POA: Insufficient documentation

## 2017-02-16 DIAGNOSIS — R1011 Right upper quadrant pain: Secondary | ICD-10-CM | POA: Diagnosis present

## 2017-02-16 DIAGNOSIS — Z79899 Other long term (current) drug therapy: Secondary | ICD-10-CM | POA: Diagnosis not present

## 2017-02-16 LAB — URINALYSIS, ROUTINE W REFLEX MICROSCOPIC
BACTERIA UA: NONE SEEN
BILIRUBIN URINE: NEGATIVE
Glucose, UA: NEGATIVE mg/dL
Hgb urine dipstick: NEGATIVE
Ketones, ur: NEGATIVE mg/dL
Leukocytes, UA: NEGATIVE
Nitrite: NEGATIVE
Protein, ur: 30 mg/dL — AB
SPECIFIC GRAVITY, URINE: 1.025 (ref 1.005–1.030)
pH: 7 (ref 5.0–8.0)

## 2017-02-16 LAB — CBC
HEMATOCRIT: 38.9 % (ref 36.0–46.0)
Hemoglobin: 12.6 g/dL (ref 12.0–15.0)
MCH: 27 pg (ref 26.0–34.0)
MCHC: 32.4 g/dL (ref 30.0–36.0)
MCV: 83.5 fL (ref 78.0–100.0)
Platelets: 354 10*3/uL (ref 150–400)
RBC: 4.66 MIL/uL (ref 3.87–5.11)
RDW: 14.9 % (ref 11.5–15.5)
WBC: 13.8 10*3/uL — ABNORMAL HIGH (ref 4.0–10.5)

## 2017-02-16 LAB — COMPREHENSIVE METABOLIC PANEL
ALBUMIN: 4.5 g/dL (ref 3.5–5.0)
ALK PHOS: 193 U/L — AB (ref 38–126)
ALT: 57 U/L — ABNORMAL HIGH (ref 14–54)
ANION GAP: 10 (ref 5–15)
AST: 91 U/L — AB (ref 15–41)
BUN: 19 mg/dL (ref 6–20)
CO2: 25 mmol/L (ref 22–32)
Calcium: 9.9 mg/dL (ref 8.9–10.3)
Chloride: 103 mmol/L (ref 101–111)
Creatinine, Ser: 0.76 mg/dL (ref 0.44–1.00)
GFR calc Af Amer: >60 mL/min (ref >60)
GFR calc non Af Amer: >60 mL/min (ref >60)
GLUCOSE: 119 mg/dL — AB (ref 65–99)
POTASSIUM: 4 mmol/L (ref 3.5–5.1)
SODIUM: 138 mmol/L (ref 135–145)
Total Bilirubin: 0.6 mg/dL (ref 0.3–1.2)
Total Protein: 8.7 g/dL — ABNORMAL HIGH (ref 6.5–8.1)

## 2017-02-16 LAB — I-STAT BETA HCG BLOOD, ED (MC, WL, AP ONLY)

## 2017-02-16 LAB — LIPASE, BLOOD: Lipase: 35 U/L (ref 11–51)

## 2017-02-16 MED ORDER — ONDANSETRON 4 MG PO TBDP: 4.0000 mg | ORAL_TABLET | Freq: Three times a day (TID) | ORAL | 0 refills | Status: DC | PRN

## 2017-02-16 MED ORDER — HYDROCODONE-ACETAMINOPHEN 5-325 MG PO TABS: 2.0000 | ORAL_TABLET | ORAL | 0 refills | Status: DC | PRN

## 2017-02-16 NOTE — ED Triage Notes (Signed)
Patient c/o abd pain that started at 1am with n/v denies diarrhea, constipation or urinary problems.

## 2017-02-16 NOTE — Discharge Instructions (Signed)
Bland diet. [facility] to discuss removing your gallbladder.

## 2017-02-17 NOTE — ED Provider Notes (Signed)
[facility] Provider Note   CSN: [id number] Arrival date & time: 02/16/17  [id number]     History   Chief Complaint Chief Complaint  Patient presents with  . Abdominal Pain  . Emesis    HPI Lisa Charles is a 31 y.o. female. Chief complaint is right upper quadrant pain.  HPI:  31 year old female. 1 month postpartum. Had some episodes of rapid quadrant pain late pregnancy. Has had 2-3 additional episodes in the last 10 days. No fever. Has not been jaundiced. Gets episodes of pain that is like a band across her upper abdomen with nausea. Cannot associated with particular foods. She is breast-feeding. Baby is doing well.  Past Medical History:  Diagnosis Date  . Bronchitis   . Pre-eclampsia     Patient Active Problem List   Diagnosis Date Noted  . SVD (spontaneous vaginal delivery) 01/20/2017  . Chronic hypertension during pregnancy, antepartum 01/16/2017  . GBS (group B Streptococcus carrier), +RV culture, currently pregnant 01/16/2017  . Gestational hypertension 01/16/2017  . Encounter for supervision of normal pregnancy, antepartum 07/06/2016  . Hx of preeclampsia, prior pregnancy, currently pregnant 02/20/2012    Past Surgical History:  Procedure Laterality Date  . NO PAST SURGERIES      OB History    Gravida Para Term Preterm AB Living   2 2 2  0 0 2   SAB TAB Ectopic Multiple Live Births   0 0 0 0 2       Home Medications    Prior to Admission medications   Medication Sig Start Date End Date Taking? Authorizing Provider  acetaminophen (TYLENOL) 500 MG tablet Take 500 mg by mouth every 4 (four) hours as needed for moderate pain.    Yes [provider]  hydrochlorothiazide (HYDRODIURIL) 25 MG tablet Take 1 tablet (25 mg total) by mouth daily. 01/25/17  Yes Adam PhenixArnold, Deral Schellenberg G, MD  ibuprofen (ADVIL,MOTRIN) 600 MG tablet Take 1 tablet (600 mg total) every 6 (six) hours as needed by mouth. 01/19/17  Yes Lockamy, Timothy, DO    Prenatal Vit-Fe Fumarate-FA (MULTIVITAMIN-PRENATAL) 27-0.8 MG TABS tablet Take 1 tablet by mouth daily at 12 noon.   Yes [provider]  HYDROcodone-acetaminophen (NORCO/VICODIN) 5-325 MG tablet Take 2 tablets by mouth every 4 (four) hours as needed. 02/16/17   Rolland PorterJames, Mackenzy Eisenberg, MD  ondansetron (ZOFRAN ODT) 4 MG disintegrating tablet Take 1 tablet (4 mg total) by mouth every 8 (eight) hours as needed for nausea. 02/16/17   Rolland PorterJames, Brando Taves, MD    Family History Family History  Problem Relation Age of Onset  . Diabetes Mother   . Hypertension Mother   . Hyperlipidemia Mother   . Gout Father     Social History Social History   Tobacco Use  . Smoking status: Former Smoker    Last attempt to quit: 02/2016    Years since quitting: 1.0  . Smokeless tobacco: Former Engineer, waterUser  Substance Use Topics  . Alcohol use: No  . Drug use: No     Allergies   Patient has no known allergies.   Review of Systems Review of Systems  Constitutional: Negative for appetite change, chills, diaphoresis, fatigue and fever.  HENT: Negative for mouth sores, sore throat and trouble swallowing.   Eyes: Negative for visual disturbance.  Respiratory: Negative for cough, chest tightness, shortness of breath and wheezing.   Cardiovascular: Negative for chest pain.  Gastrointestinal: Positive for abdominal pain and nausea. Negative for abdominal distention, diarrhea and vomiting.  Endocrine: Negative for polydipsia, polyphagia and polyuria.  Genitourinary: Negative for dysuria, frequency and hematuria.  Musculoskeletal: Negative for gait problem.  Skin: Negative for color change, pallor and rash.  Neurological: Negative for dizziness, syncope, light-headedness and headaches.  Hematological: Does not bruise/bleed easily.  Psychiatric/Behavioral: Negative for behavioral problems and confusion.     Physical Exam Updated Vital Signs BP (!) 120/51 (BP Location: Right Arm)   Pulse 62   Temp (!) 97.3 F (36.3  C) (Oral)   Resp 16   Ht 5' 8.5" (1.74 m)   Wt 135.2 kg (298 lb)   LMP 04/22/2016   SpO2 99%   BMI 44.65 kg/m   Physical Exam  Constitutional: She is oriented to person, place, and time. She appears well-developed and well-nourished. No distress.  HENT:  Head: Normocephalic.  Eyes: Conjunctivae are normal. Pupils are equal, round, and reactive to light. No scleral icterus.  Neck: Normal range of motion. Neck supple. No thyromegaly present.  Cardiovascular: Normal rate and regular rhythm. Exam reveals no gallop and no friction rub.  No murmur heard. Pulmonary/Chest: Effort normal and breath sounds normal. No respiratory distress. She has no wheezes. She has no rales.  Abdominal: Soft. Bowel sounds are normal. She exhibits no distension. There is no tenderness. There is no rebound.  Currently symptomatically. Nontender in the upper abdomen.  Musculoskeletal: Normal range of motion.  Neurological: She is alert and oriented to person, place, and time.  Skin: Skin is warm and dry. No rash noted.  Psychiatric: She has a normal mood and affect. Her behavior is normal.     ED Treatments / Results  Labs (all labs ordered are listed, but only abnormal results are displayed) Labs Reviewed  COMPREHENSIVE METABOLIC PANEL - Abnormal; Notable for the following components:      Result Value   Glucose, Bld 119 (*)    Total Protein 8.7 (*)    AST 91 (*)    ALT 57 (*)    Alkaline Phosphatase 193 (*)    All other components within normal limits  CBC - Abnormal; Notable for the following components:   WBC 13.8 (*)    All other components within normal limits  URINALYSIS, ROUTINE W REFLEX MICROSCOPIC - Abnormal; Notable for the following components:   Protein, ur 30 (*)    Squamous Epithelial / LPF 0-5 (*)    All other components within normal limits  LIPASE, BLOOD  I-STAT BETA HCG BLOOD, ED ([facility], [facility], [facility] ONLY)    EKG  EKG Interpretation None       Radiology US Abdomen Limited  Ruq  Result Date: 02/16/2017 CLINICAL DATA:  Right upper quadrant pain for 2 months EXAM: ULTRASOUND ABDOMEN LIMITED RIGHT UPPER QUADRANT COMPARISON:  01/12/2016 FINDINGS: Gallbladder: Multiple small calculi within the gallbladder lumen measuring up to 2.9 mm. No gallbladder mural thickening or pericholecystic fluid. The patient was not tender to probe pressure over the gallbladder. Common bile duct: Diameter: Dilated, 7 mm. Liver: No focal lesion identified. Within normal limits in parenchymal echogenicity. Portal vein is patent on color Doppler imaging with normal direction of blood flow towards the liver. There are study limitations due to patient body habitus. IMPRESSION: Cholelithiasis. Mild dilatation of the common bile duct. Limited study. Electronically Signed   By: Ellery Plunk M.D.   On: 02/16/2017 22:46    Procedures Procedures (including critical care time)  Medications Ordered in ED Medications - No data to display   Initial Impression / Assessment and Plan /  ED Course  I have reviewed the triage vital signs and the nursing notes.  Pertinent labs & imaging results that were available during my care of the patient were reviewed by me and considered in my medical decision making (see chart for details).    : Ultrasound shows multiple mobile stones. No ductal dilatation. Sonographic negative Murphy sign. No pericholecystic fluid. Has leukocytosis. His elevation of alkaline phosphatase. This is been elevated since late pregnancy.  Do not feel this represents acute cholecystitis. Recurrent episodes of biliary colic. I think she is appropriate for discharge and surgical follow-up. Bland diet. When necessary Zofran and Vicodin. ER with fever, jaundice, uncontrolled pain, other symptoms. [facility].  Was counseled regarding use of pain medication and breast-feeding. If using Vicodin more than one or more frequent than 6 hours pump and discard.  Final  Clinical Impressions(s) / ED Diagnoses   Final diagnoses:  RUQ pain  Biliary colic    ED Discharge Orders        Ordered    ondansetron (ZOFRAN ODT) 4 MG disintegrating tablet  Every 8 hours PRN     02/16/17 2312    HYDROcodone-acetaminophen (NORCO/VICODIN) 5-325 MG tablet  Every 4 hours PRN     02/16/17 2312       Rolland PorterJames, Ulyana Pitones, MD 02/17/17 [id number]

## 2017-02-22 ENCOUNTER — Other Ambulatory Visit: Payer: Self-pay

## 2017-02-22 ENCOUNTER — Ambulatory Visit (INDEPENDENT_AMBULATORY_CARE_PROVIDER_SITE_OTHER): Payer: Medicaid Other | Admitting: Certified Nurse Midwife

## 2017-02-22 ENCOUNTER — Ambulatory Visit: Payer: Medicaid Other | Admitting: Certified Nurse Midwife

## 2017-02-22 ENCOUNTER — Encounter: Payer: Self-pay | Admitting: Certified Nurse Midwife

## 2017-02-22 DIAGNOSIS — Z3202 Encounter for pregnancy test, result negative: Secondary | ICD-10-CM

## 2017-02-22 DIAGNOSIS — Z30011 Encounter for initial prescription of contraceptive pills: Secondary | ICD-10-CM

## 2017-02-22 DIAGNOSIS — Z1389 Encounter for screening for other disorder: Secondary | ICD-10-CM

## 2017-02-22 LAB — POCT URINE PREGNANCY: Preg Test, Ur: NEGATIVE

## 2017-02-22 MED ORDER — NORETHINDRONE 0.35 MG PO TABS: 1.0000 | ORAL_TABLET | Freq: Every day | ORAL | 11 refills | Status: DC

## 2017-02-22 NOTE — Progress Notes (Signed)
   Lisa Charles is a 31 y.o. female who presents for a postpartum visit. She is 5 weeks postpartum following a spontaneous vaginal delivery. I have fully reviewed the prenatal and intrapartum course. The delivery was at 38 gestational weeks. Outcome: spontaneous vaginal delivery. Anesthesia: epidural. Postpartum course has been UNREMARKABLE. Baby's course has been UNREMARKABLE. Baby is feeding by both breast and bottle - Gerber. Bleeding no bleeding. Bowel function is normal. Bladder function is normal. Patient is not sexually active. Contraception method is none. Postpartum depression screening: negative.  ED note reviewed from 02/16/17.  Denies pain currently.  States that she has a consultation with general surgery scheduled.  Does have a PCP.  Blood pressure WNL today.  Discussed POP vs OCP.  Given currently hx, POP recommended.   The following portions of the patient's history were reviewed and updated as appropriate: allergies, current medications, past family history, past medical history, past social history, past surgical history and problem list.  Review of Systems Pertinent items noted in HPI and remainder of comprehensive ROS otherwise negative.   Objective:    BP 137/81   Pulse 69   Ht 5' 8.5" (1.74 m)   Wt 275 lb 8 oz (125 kg)   LMP 04/22/2016   Breastfeeding? Yes   BMI 41.28 kg/m   General:  alert, cooperative and no distress   Breasts:  inspection negative, no nipple discharge or bleeding, no masses or nodularity palpable  Lungs: clear to auscultation bilaterally  Heart:  regular rate and rhythm, S1, S2 normal, no murmur, click, rub or gallop  Abdomen: soft, non-tender; bowel sounds normal; no masses,  no organomegaly, obese  Pelvic/Rectal Exam: Not performed.       Pap Smear: 07/06/16: negative  Assessment:    Normal 5 week postpartum exam. Pap smear not done at today's visit.  1. Postpartum care and examination    - POCT urine pregnancy  2. Encounter for initial  prescription of contraceptive pills    - norethindrone (MICRONOR,CAMILA,ERRIN) 0.35 MG tablet; Take 1 tablet (0.35 mg total) by mouth daily.  Dispense: 1 Package; Refill: 11  Plan:    1. Contraception: oral progesterone-only contraceptive 2.  F/U cholecystectomy surgery when able.  POP for now.  Return to work in January letter completed.  3. Follow up in: 6 months annual exam or as needed.

## 2017-02-22 NOTE — Progress Notes (Signed)
  Subjective:  Wants OCP. Having problems with her Gallbladder.

## 2017-03-10 ENCOUNTER — Other Ambulatory Visit: Payer: Self-pay | Admitting: Internal Medicine

## 2017-03-10 DIAGNOSIS — K819 Cholecystitis, unspecified: Secondary | ICD-10-CM

## 2017-03-10 DIAGNOSIS — R1084 Generalized abdominal pain: Secondary | ICD-10-CM

## 2017-03-10 DIAGNOSIS — R112 Nausea with vomiting, unspecified: Secondary | ICD-10-CM

## 2018-01-16 ENCOUNTER — Emergency Department (HOSPITAL_COMMUNITY)
Admission: EM | Admit: 2018-01-16 | Discharge: 2018-01-16 | Disposition: A | Discharge status: Home / Self Care | Payer: Self-pay | Attending: Emergency Medicine | Admitting: Emergency Medicine

## 2018-01-16 ENCOUNTER — Encounter (HOSPITAL_COMMUNITY): Payer: Self-pay | Admitting: Emergency Medicine

## 2018-01-16 ENCOUNTER — Emergency Department (HOSPITAL_COMMUNITY): Payer: Self-pay

## 2018-01-16 ENCOUNTER — Other Ambulatory Visit: Payer: Self-pay

## 2018-01-16 DIAGNOSIS — Z79899 Other long term (current) drug therapy: Secondary | ICD-10-CM | POA: Insufficient documentation

## 2018-01-16 DIAGNOSIS — J069 Acute upper respiratory infection, unspecified: Secondary | ICD-10-CM | POA: Insufficient documentation

## 2018-01-16 DIAGNOSIS — B9789 Other viral agents as the cause of diseases classified elsewhere: Secondary | ICD-10-CM | POA: Insufficient documentation

## 2018-01-16 DIAGNOSIS — J0110 Acute frontal sinusitis, unspecified: Secondary | ICD-10-CM | POA: Insufficient documentation

## 2018-01-16 DIAGNOSIS — Z87891 Personal history of nicotine dependence: Secondary | ICD-10-CM | POA: Insufficient documentation

## 2018-01-16 MED ORDER — BENZONATATE 100 MG PO CAPS: 200.0000 mg | ORAL_CAPSULE | Freq: Three times a day (TID) | ORAL | 0 refills | Status: DC

## 2018-01-16 MED ORDER — FLUTICASONE PROPIONATE 50 MCG/ACT NA SUSP: 1.0000 | Freq: Every day | NASAL | 2 refills | Status: DC

## 2018-01-16 MED ORDER — CETIRIZINE HCL 5 MG PO TABS: 5.0000 mg | ORAL_TABLET | Freq: Every day | ORAL | 0 refills | Status: DC

## 2018-01-16 NOTE — Discharge Instructions (Signed)
Return to ED for worsening symptoms, chest pain, vomiting or coughing up blood, shortness of breath, lightheadedness.

## 2018-01-16 NOTE — ED Provider Notes (Signed)
[facility] EMERGENCY DEPARTMENT Provider Note   CSN: [id number] Arrival date & time: 01/16/18  1706     History   Chief Complaint Chief Complaint  Patient presents with  . URI    HPI Lisa VECCHIARELLI is a 32 y.o. female with a past medical history of chronic bronchitis who presents to ED for URI symptoms including cough productive with green mucus, sinus pain and pressure, rhinorrhea for the past 7 days.  Sick contacts including her children with similar symptoms.  She has tried NyQuil with improvement in her symptoms.  She denies any fever, shortness of breath, hemoptysis, ear pain or sore throat.  She did not receive her influenza vaccine this year.  HPI  Past Medical History:  Diagnosis Date  . Bronchitis   . Pre-eclampsia     Patient Active Problem List   Diagnosis Date Noted  . Chronic hypertension during pregnancy, antepartum 01/16/2017  . Gestational hypertension 01/16/2017    Past Surgical History:  Procedure Laterality Date  . NO PAST SURGERIES       OB History    Gravida  2   Para  2   Term  2   Preterm  0   AB  0   Living  2     SAB  0   TAB  0   Ectopic  0   Multiple  0   Live Births  2            Home Medications    Prior to Admission medications   Medication Sig Start Date End Date Taking? Authorizing Provider  acetaminophen (TYLENOL) 500 MG tablet Take 500 mg by mouth every 4 (four) hours as needed for moderate pain.     [provider]  benzonatate (TESSALON) 100 MG capsule Take 2 capsules (200 mg total) by mouth every 8 (eight) hours. 01/16/18   Odas Ozer, PA-C  cetirizine (ZYRTEC) 5 MG tablet Take 1 tablet (5 mg total) by mouth daily. 01/16/18   Nathan Stallworth, PA-C  fluticasone (FLONASE) 50 MCG/ACT nasal spray Place 1 spray into both nostrils daily. 01/16/18   Kaizen Ibsen, PA-C  hydrochlorothiazide (HYDRODIURIL) 25 MG tablet Take 1 tablet (25 mg total) by mouth daily. 01/25/17   Adam Phenix, MD  HYDROcodone-acetaminophen (NORCO/VICODIN) 5-325 MG tablet Take 2 tablets by mouth every 4 (four) hours as needed. 02/16/17   Rolland Porter, MD  ibuprofen (ADVIL,MOTRIN) 600 MG tablet Take 1 tablet (600 mg total) every 6 (six) hours as needed by mouth. 01/19/17   Lockamy, Timothy, DO  norethindrone (MICRONOR,CAMILA,ERRIN) 0.35 MG tablet Take 1 tablet (0.35 mg total) by mouth daily. 02/22/17   Orvilla Cornwall A, CNM  ondansetron (ZOFRAN ODT) 4 MG disintegrating tablet Take 1 tablet (4 mg total) by mouth every 8 (eight) hours as needed for nausea. 02/16/17   Rolland Porter, MD  Prenatal Vit-Fe Fumarate-FA (MULTIVITAMIN-PRENATAL) 27-0.8 MG TABS tablet Take 1 tablet by mouth daily at 12 noon.    [provider]    Family History Family History  Problem Relation Age of Onset  . Diabetes Mother   . Hypertension Mother   . Hyperlipidemia Mother   . Gout Father     Social History Social History   Tobacco Use  . Smoking status: Former Smoker    Last attempt to quit: 02/2016    Years since quitting: 1.9  . Smokeless tobacco: Former Engineer, water Use Topics  . Alcohol use: No  .  Drug use: No     Allergies   Patient has no known allergies.   Review of Systems Review of Systems  Constitutional: Negative for chills and fever.  HENT: Positive for sinus pressure and sinus pain. Negative for sore throat.   Respiratory: Positive for cough.   Gastrointestinal: Negative for nausea.     Physical Exam Updated Vital Signs BP (!) 159/97   Pulse 95   Temp 98.1 F (36.7 C) (Oral)   Resp 16   LMP 01/14/2018   SpO2 99%   Physical Exam  Constitutional: She appears well-developed and well-nourished. No distress.  HENT:  Head: Normocephalic and atraumatic.  Right Ear: A middle ear effusion is present.  Left Ear: A middle ear effusion is present.  Nose: Right sinus exhibits maxillary sinus tenderness and frontal sinus tenderness. Left sinus exhibits maxillary sinus tenderness  and frontal sinus tenderness.  Mouth/Throat: Uvula is midline. No tonsillar exudate.  Eyes: Conjunctivae and EOM are normal. No scleral icterus.  Neck: Normal range of motion.  Cardiovascular: Normal rate, regular rhythm and normal heart sounds.  Pulmonary/Chest: Effort normal and breath sounds normal. No respiratory distress.  Neurological: She is alert.  Skin: No rash noted. She is not diaphoretic.  Psychiatric: She has a normal mood and affect.  Nursing note and vitals reviewed.    ED Treatments / Results  Labs (all labs ordered are listed, but only abnormal results are displayed) Labs Reviewed - No data to display  EKG None  Radiology Dg Chest 2 View  Result Date: 01/16/2018 CLINICAL DATA:  Productive cough EXAM: CHEST - 2 VIEW COMPARISON:  01/12/2016 FINDINGS: Normal heart size. Lungs clear. No pneumothorax. No pleural effusion. IMPRESSION: No active cardiopulmonary disease. Electronically Signed   By: Jolaine Click M.D.   On: 01/16/2018 17:53    Procedures Procedures (including critical care time)  Medications Ordered in ED Medications - No data to display   Initial Impression / Assessment and Plan / ED Course  I have reviewed the triage vital signs and the nursing notes.  Pertinent labs & imaging results that were available during my care of the patient were reviewed by me and considered in my medical decision making (see chart for details).     32 year old female presents to ED for 7-day history of cough productive with green mucus, sinus pain pressure, rhinorrhea.  Sick contacts at home with similar symptoms.  On exam patient is overall well-appearing presents with auscultation bilaterally.  She is not tachycardic, tachypneic or hypoxic.  X-ray is negative.  Denies any chest pain, shortness of breath or hemoptysis.  Suspect that symptoms are viral in nature.  No need to treat with antibiotics as patient's symptoms have been going on for 7 days and not the recommended  10 days for bacterial sinusitis.  Will treat supportively and advised her to return to ED for any severe worsening symptoms.  Patient is hemodynamically stable, in NAD, and able to ambulate in the ED. Evaluation does not show pathology that would require ongoing emergent intervention or inpatient treatment. I explained the diagnosis to the patient. Pain has been managed and has no complaints prior to discharge. Patient is comfortable with above plan and is stable for discharge at this time. All questions were answered prior to disposition. Strict return precautions for returning to the ED were discussed. Encouraged follow up with PCP.    Portions of this note were generated with Scientist, clinical (histocompatibility and immunogenetics). Dictation errors may occur despite best attempts at proofreading.  Final Clinical Impressions(s) / ED Diagnoses   Final diagnoses:  Viral URI with cough  Acute non-recurrent frontal sinusitis    ED Discharge Orders         Ordered    benzonatate (TESSALON) 100 MG capsule  Every 8 hours     01/16/18 1759    fluticasone (FLONASE) 50 MCG/ACT nasal spray  Daily     01/16/18 1759    cetirizine (ZYRTEC) 5 MG tablet  Daily     01/16/18 1759           Dietrich Pates, PA-C 01/16/18 1800    Margarita Grizzle, MD 01/16/18 1910

## 2018-01-16 NOTE — ED Notes (Signed)
Pt transported to xray 

## 2018-01-16 NOTE — ED Triage Notes (Signed)
PT reports a "chest cold" that started Friday. PT also has congestion and a headache. Denies sore throat, ear pain.   PT reports cough is productive of green mucus.

## 2018-03-15 IMAGING — DX DG ABDOMEN 2V
3 series · 3 of 3 positions shown · non-contrast
Comparison: None in PACs

CLINICAL DATA: Mid abdominal and back pain and sharp in caliber.
Recent vaginal delivery 23 days ago. Patient reports vomiting occurs
with episodes of pain. The patient reports no nausea, fever, or
diarrhea. History of hypertension.

EXAM:
ABDOMEN - 2 VIEW

[abdomen erect]
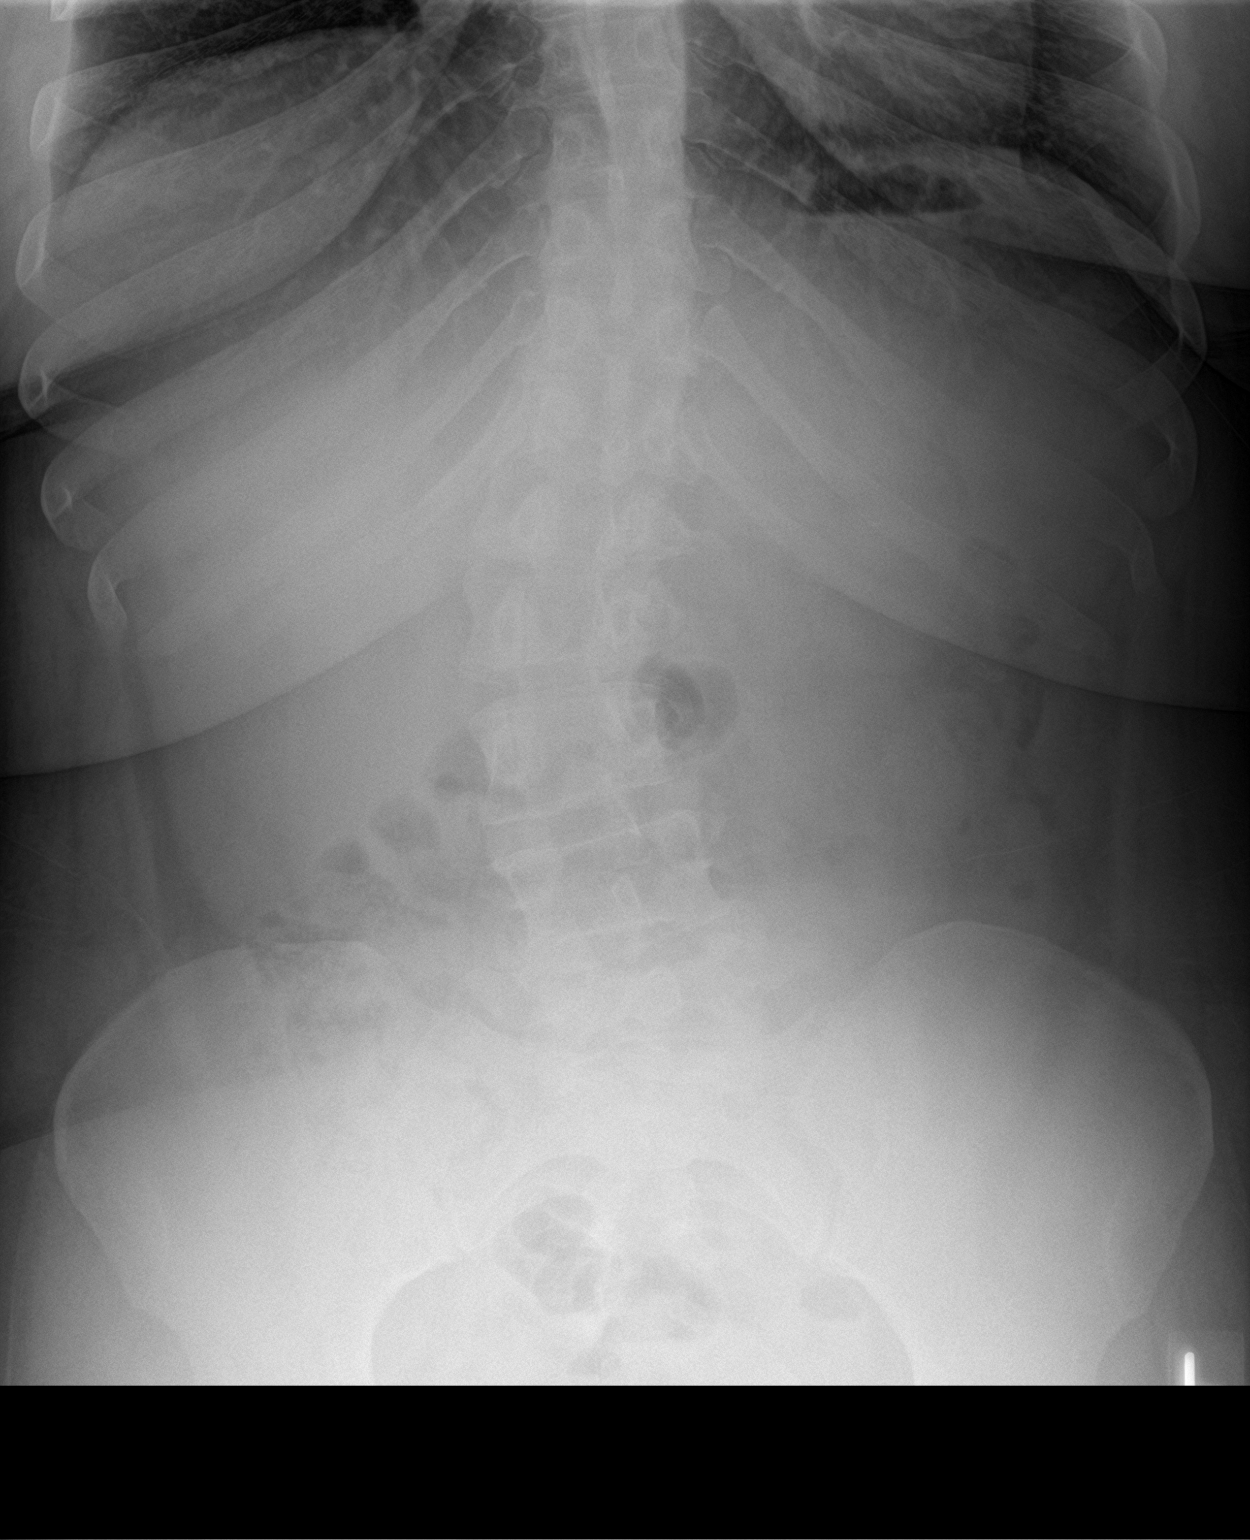

[abdomen supine (1 of 2)]
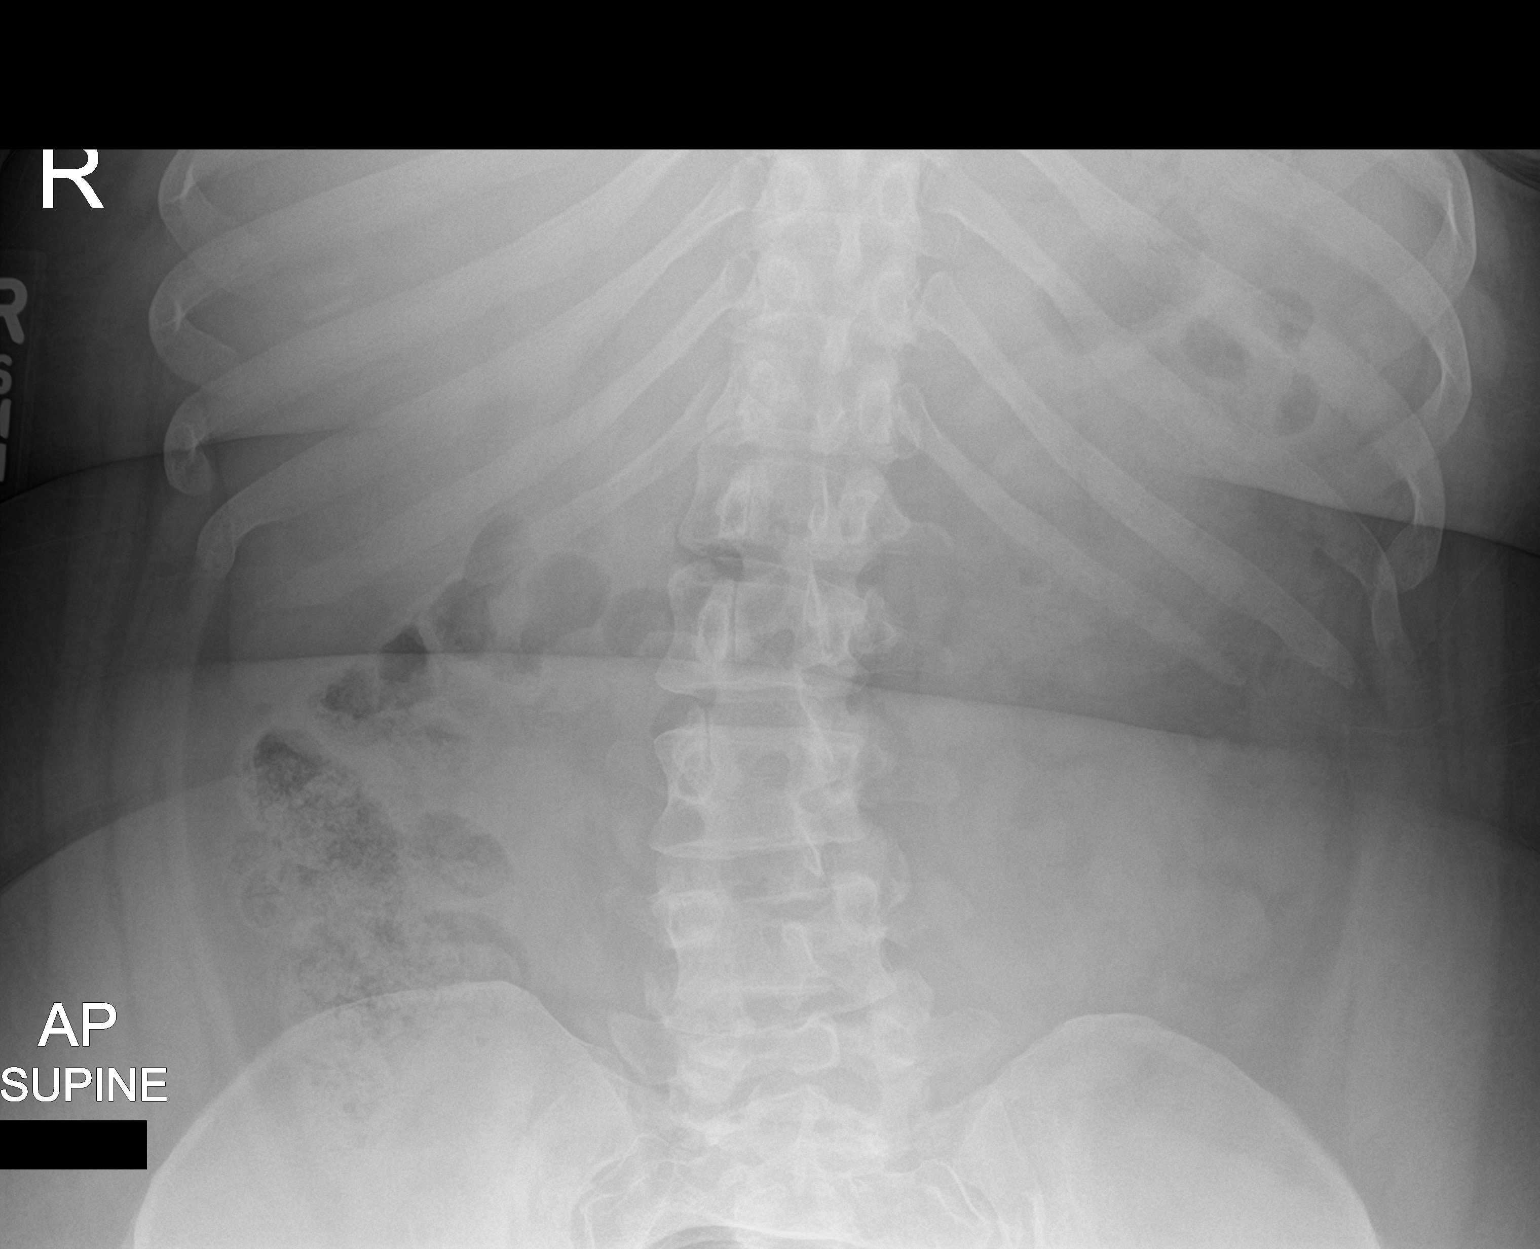

[abdomen supine (2 of 2)]
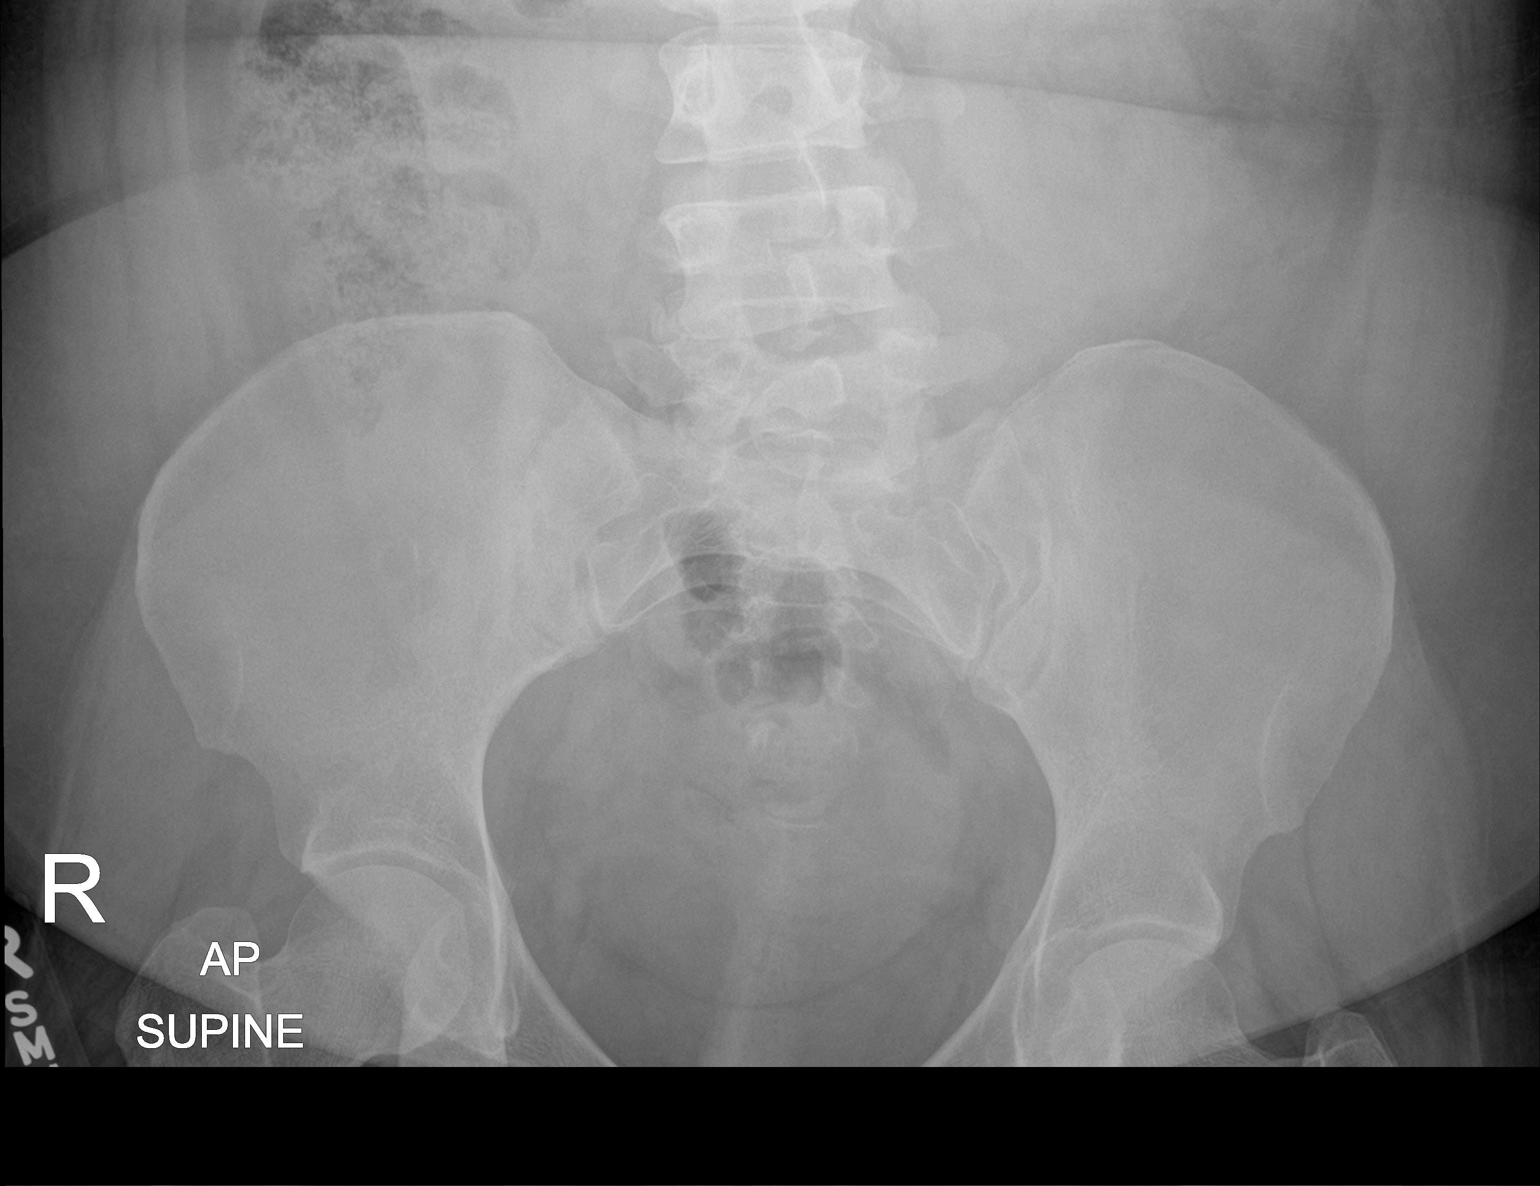

[3 of 3 positions shown; findings below may reference images not displayed]

FINDINGS: The bowel gas pattern is normal. There are no abnormal soft tissue
calcifications. There is gentle curvature of the mid lumbar spine
convex toward the right.
IMPRESSION: There is no acute intra-abdominal abnormality.

## 2018-03-25 ENCOUNTER — Encounter (HOSPITAL_COMMUNITY): Payer: Self-pay

## 2018-03-25 ENCOUNTER — Emergency Department (HOSPITAL_COMMUNITY)
Admission: EM | Admit: 2018-03-25 | Discharge: 2018-03-25 | Disposition: A | Discharge status: Home / Self Care | Payer: Self-pay | Attending: Emergency Medicine | Admitting: Emergency Medicine

## 2018-03-25 DIAGNOSIS — Z79899 Other long term (current) drug therapy: Secondary | ICD-10-CM | POA: Insufficient documentation

## 2018-03-25 DIAGNOSIS — K0889 Other specified disorders of teeth and supporting structures: Secondary | ICD-10-CM | POA: Insufficient documentation

## 2018-03-25 DIAGNOSIS — Z87891 Personal history of nicotine dependence: Secondary | ICD-10-CM | POA: Insufficient documentation

## 2018-03-25 MED ORDER — AMOXICILLIN-POT CLAVULANATE 875-125 MG PO TABS: 1.0000 | ORAL_TABLET | Freq: Two times a day (BID) | ORAL | 0 refills | Status: AC

## 2018-03-25 NOTE — ED Triage Notes (Signed)
Pt presents for evaluation of dental pain to R side since Monday with jaw swelling starting yesterday. Denies fever.

## 2018-03-25 NOTE — ED Provider Notes (Signed)
[facility] EMERGENCY DEPARTMENT Provider Note   CSN: [id number] Arrival date & time: 03/25/18  3662     History   Chief Complaint Chief Complaint  Patient presents with  . Dental Pain    HPI Lisa Charles is a 33 y.o. female.  HPI  Pt is a 33 year old female history of bronchitis and preeclampsia presents the emergency department today complaining of right lower dental pain that began about a week ago.  For the last 2 days she reports associated swelling to the right side of her jaw.  Pain is severe and constant in nature.  It has worsened since onset.  She has tried [facility] powder and ibuprofen at home without relief.  She has had no fevers or chills.  No difficulty swallowing.  She does not have a Education officer, community.  Past Medical History:  Diagnosis Date  . Bronchitis   . Pre-eclampsia     Patient Active Problem List   Diagnosis Date Noted  . Chronic hypertension during pregnancy, antepartum 01/16/2017  . Gestational hypertension 01/16/2017    Past Surgical History:  Procedure Laterality Date  . NO PAST SURGERIES       OB History    Gravida  2   Para  2   Term  2   Preterm  0   AB  0   Living  2     SAB  0   TAB  0   Ectopic  0   Multiple  0   Live Births  2            Home Medications    Prior to Admission medications   Medication Sig Start Date End Date Taking? Authorizing Provider  acetaminophen (TYLENOL) 500 MG tablet Take 500 mg by mouth every 4 (four) hours as needed for moderate pain.     [provider]  amoxicillin-clavulanate (AUGMENTIN) 875-125 MG tablet Take 1 tablet by mouth every 12 (twelve) hours for 7 days. 03/25/18 04/01/18  Lygia Olaes S, PA-C  benzonatate (TESSALON) 100 MG capsule Take 2 capsules (200 mg total) by mouth every 8 (eight) hours. 01/16/18   Khatri, Hina, PA-C  cetirizine (ZYRTEC) 5 MG tablet Take 1 tablet (5 mg total) by mouth daily. 01/16/18   Khatri, Hina, PA-C  fluticasone (FLONASE) 50  MCG/ACT nasal spray Place 1 spray into both nostrils daily. 01/16/18   Khatri, Hina, PA-C  hydrochlorothiazide (HYDRODIURIL) 25 MG tablet Take 1 tablet (25 mg total) by mouth daily. 01/25/17   Adam Phenix, MD  HYDROcodone-acetaminophen (NORCO/VICODIN) 5-325 MG tablet Take 2 tablets by mouth every 4 (four) hours as needed. 02/16/17   Rolland Porter, MD  ibuprofen (ADVIL,MOTRIN) 600 MG tablet Take 1 tablet (600 mg total) every 6 (six) hours as needed by mouth. 01/19/17   Lockamy, Timothy, DO  norethindrone (MICRONOR,CAMILA,ERRIN) 0.35 MG tablet Take 1 tablet (0.35 mg total) by mouth daily. 02/22/17   Orvilla Cornwall A, CNM  ondansetron (ZOFRAN ODT) 4 MG disintegrating tablet Take 1 tablet (4 mg total) by mouth every 8 (eight) hours as needed for nausea. 02/16/17   Rolland Porter, MD  Prenatal Vit-Fe Fumarate-FA (MULTIVITAMIN-PRENATAL) 27-0.8 MG TABS tablet Take 1 tablet by mouth daily at 12 noon.    [provider]    Family History Family History  Problem Relation Age of Onset  . Diabetes Mother   . Hypertension Mother   . Hyperlipidemia Mother   . Gout Father     Social History Social History  Tobacco Use  . Smoking status: Former Smoker    Last attempt to quit: 02/2016    Years since quitting: 2.1  . Smokeless tobacco: Former Engineer, water Use Topics  . Alcohol use: No  . Drug use: No     Allergies   Patient has no known allergies.   Review of Systems Review of Systems  Constitutional: Negative for fever.  HENT: Positive for dental problem. Negative for congestion, rhinorrhea and sore throat.   Eyes: Negative for photophobia.  Respiratory: Negative for cough and shortness of breath.   Cardiovascular: Negative for chest pain.  Gastrointestinal: Negative for abdominal pain, nausea and vomiting.     Physical Exam Updated Vital Signs BP (!) 125/51 (BP Location: Right Arm)   Pulse 65   Temp 98.3 F (36.8 C) (Oral)   Resp 16   SpO2 99%   Physical  Exam Vitals signs and nursing note reviewed.  Constitutional:      General: She is not in acute distress.    Appearance: She is well-developed.  HENT:     Head: Normocephalic and atraumatic.     Mouth/Throat:     Mouth: Mucous membranes are moist.     Pharynx: No oropharyngeal exudate or posterior oropharyngeal erythema.     Comments: No swelling beneath the tongue. TTP and minimal swelling to the right masseter. No obvious induration or fluctuance palpated over this area or intraorally. Tooth #30 is tender to percussion. Eyes:     Conjunctiva/sclera: Conjunctivae normal.  Neck:     Musculoskeletal: Neck supple.  Cardiovascular:     Rate and Rhythm: Normal rate.  Pulmonary:     Effort: Pulmonary effort is normal.  Musculoskeletal: Normal range of motion.  Skin:    General: Skin is warm and dry.  Neurological:     Mental Status: She is alert.      ED Treatments / Results  Labs (all labs ordered are listed, but only abnormal results are displayed) Labs Reviewed - No data to display  EKG None  Radiology No results found.  Procedures Procedures (including critical care time)  Medications Ordered in ED Medications - No data to display   Initial Impression / Assessment and Plan / ED Course  I have reviewed the triage vital signs and the nursing notes.  Pertinent labs & imaging results that were available during my care of the patient were reviewed by me and considered in my medical decision making (see chart for details).     Final Clinical Impressions(s) / ED Diagnoses   Final diagnoses:  Pain, dental   Patient with toothache.  No gross abscess.  Exam unconcerning for Ludwig's angina or spread of infection.  Will treat with augmentin and pain medicine.  Urged patient to follow-up with dentist.  Advised to return if worse. She voices understanding of the plan and reasons to return. All questions answered.   ED Discharge Orders         Ordered     amoxicillin-clavulanate (AUGMENTIN) 875-125 MG tablet  Every 12 hours     03/25/18 0849           Karrie Meres, PA-C 03/25/18 0855    Lorre Nick, MD 03/26/18 1539

## 2018-03-25 NOTE — Discharge Instructions (Addendum)
You were given a prescription for antibiotics. Please take the antibiotic prescription fully.   Please follow-up with a dentist in the next 5 to 7 days for reevaluation.  If you do not have a dentist, resources were provided for dentist in the area in your discharge summary.  Please contact one of the offices that are listed and make an appointment for follow-up.  Please return to the emergency department for any new or worsening symptoms.  

## 2018-04-23 ENCOUNTER — Encounter (HOSPITAL_COMMUNITY): Payer: Self-pay

## 2018-04-23 ENCOUNTER — Emergency Department (HOSPITAL_COMMUNITY)
Admission: EM | Admit: 2018-04-23 | Discharge: 2018-04-24 | Disposition: A | Discharge status: Home / Self Care | Payer: Self-pay | Attending: Emergency Medicine | Admitting: Emergency Medicine

## 2018-04-23 DIAGNOSIS — K625 Hemorrhage of anus and rectum: Secondary | ICD-10-CM | POA: Insufficient documentation

## 2018-04-23 DIAGNOSIS — Z87891 Personal history of nicotine dependence: Secondary | ICD-10-CM | POA: Insufficient documentation

## 2018-04-23 DIAGNOSIS — Z79899 Other long term (current) drug therapy: Secondary | ICD-10-CM | POA: Insufficient documentation

## 2018-04-23 LAB — I-STAT BETA HCG BLOOD, ED (MC, WL, AP ONLY): I-stat hCG, quantitative: <5 m[IU]/mL (ref <5)

## 2018-04-23 LAB — CBC
HEMATOCRIT: 35.9 % — AB (ref 36.0–46.0)
HEMOGLOBIN: 10.7 g/dL — AB (ref 12.0–15.0)
MCH: 23.9 pg — ABNORMAL LOW (ref 26.0–34.0)
MCHC: 29.8 g/dL — AB (ref 30.0–36.0)
MCV: 80.3 fL (ref 80.0–100.0)
Platelets: 284 10*3/uL (ref 150–400)
RBC: 4.47 MIL/uL (ref 3.87–5.11)
RDW: 17.2 % — ABNORMAL HIGH (ref 11.5–15.5)
WBC: 9.4 10*3/uL (ref 4.0–10.5)
nRBC: 0 % (ref 0.0–0.2)

## 2018-04-23 NOTE — ED Triage Notes (Signed)
Patient complains of abnormal vaginal bleeding the past week. States that she had her regular menstrual cycle from 2/-2/10 and then has had daily bleeding x 4 days. Denies cramping and states bleeding not overly heavy, no clots

## 2018-04-24 LAB — POC OCCULT BLOOD, ED: FECAL OCCULT BLD: POSITIVE — AB

## 2018-04-24 MED ORDER — OMEPRAZOLE 20 MG PO CPDR: 20.0000 mg | DELAYED_RELEASE_CAPSULE | Freq: Every day | ORAL | 0 refills | Status: DC

## 2018-04-24 NOTE — ED Provider Notes (Signed)
[facility] EMERGENCY DEPARTMENT Provider Note   CSN: [id number] Arrival date & time: 04/23/18  1729    History   Chief Complaint Chief Complaint  Patient presents with  . abnormal vag bleeding    HPI Lisa Charles is a 33 y.o. female.     Patient presents to the emergency department with a chief complaint of vaginal versus rectal bleeding.  She states that she has had some bleeding with bowel movements over the past 4 days.  She states that she finished her menstrual cycle about 8 days ago.  She states that she has not been needing to use pads or tampons, because the bleeding is not heavy.  She notices bright red blood when wiping.  She is concerned that she is still having bleeding from her menstrual cycle, but is unclear whether the blood is coming from her rectum or her vagina.  The history is provided by the patient. No language interpreter was used.    Past Medical History:  Diagnosis Date  . Bronchitis   . Pre-eclampsia     Patient Active Problem List   Diagnosis Date Noted  . Chronic hypertension during pregnancy, antepartum 01/16/2017  . Gestational hypertension 01/16/2017    Past Surgical History:  Procedure Laterality Date  . NO PAST SURGERIES       OB History    Gravida  2   Para  2   Term  2   Preterm  0   AB  0   Living  2     SAB  0   TAB  0   Ectopic  0   Multiple  0   Live Births  2            Home Medications    Prior to Admission medications   Medication Sig Start Date End Date Taking? Authorizing Provider  acetaminophen (TYLENOL) 500 MG tablet Take 500 mg by mouth every 4 (four) hours as needed for moderate pain.     [provider]  benzonatate (TESSALON) 100 MG capsule Take 2 capsules (200 mg total) by mouth every 8 (eight) hours. 01/16/18   Khatri, Hina, PA-C  cetirizine (ZYRTEC) 5 MG tablet Take 1 tablet (5 mg total) by mouth daily. 01/16/18   Khatri, Hina, PA-C  fluticasone (FLONASE) 50  MCG/ACT nasal spray Place 1 spray into both nostrils daily. 01/16/18   Khatri, Hina, PA-C  hydrochlorothiazide (HYDRODIURIL) 25 MG tablet Take 1 tablet (25 mg total) by mouth daily. 01/25/17   Adam Phenix, MD  HYDROcodone-acetaminophen (NORCO/VICODIN) 5-325 MG tablet Take 2 tablets by mouth every 4 (four) hours as needed. 02/16/17   Rolland Porter, MD  ibuprofen (ADVIL,MOTRIN) 600 MG tablet Take 1 tablet (600 mg total) every 6 (six) hours as needed by mouth. 01/19/17   Lockamy, Timothy, DO  norethindrone (MICRONOR,CAMILA,ERRIN) 0.35 MG tablet Take 1 tablet (0.35 mg total) by mouth daily. 02/22/17   Orvilla Cornwall A, CNM  ondansetron (ZOFRAN ODT) 4 MG disintegrating tablet Take 1 tablet (4 mg total) by mouth every 8 (eight) hours as needed for nausea. 02/16/17   Rolland Porter, MD  Prenatal Vit-Fe Fumarate-FA (MULTIVITAMIN-PRENATAL) 27-0.8 MG TABS tablet Take 1 tablet by mouth daily at 12 noon.    [provider]    Family History Family History  Problem Relation Age of Onset  . Diabetes Mother   . Hypertension Mother   . Hyperlipidemia Mother   . Gout Father     Social  History Social History   Tobacco Use  . Smoking status: Former Smoker    Last attempt to quit: 02/2016    Years since quitting: 2.2  . Smokeless tobacco: Former Engineer, water Use Topics  . Alcohol use: No  . Drug use: No     Allergies   Patient has no known allergies.   Review of Systems Review of Systems  All other systems reviewed and are negative.    Physical Exam Updated Vital Signs BP (!) 128/45 (BP Location: Right Arm)   Pulse 75   Temp 98.2 F (36.8 C) (Oral)   Resp 16   SpO2 100%   Physical Exam Vitals signs and nursing note reviewed.  Constitutional:      Appearance: She is well-developed.  HENT:     Head: Normocephalic and atraumatic.  Eyes:     Conjunctiva/sclera: Conjunctivae normal.     Pupils: Pupils are equal, round, and reactive to light.  Neck:      Musculoskeletal: Normal range of motion and neck supple.  Cardiovascular:     Rate and Rhythm: Normal rate and regular rhythm.     Heart sounds: No murmur. No friction rub. No gallop.   Pulmonary:     Effort: Pulmonary effort is normal. No respiratory distress.     Breath sounds: Normal breath sounds. No wheezing or rales.  Chest:     Chest wall: No tenderness.  Abdominal:     General: Bowel sounds are normal. There is no distension.     Palpations: Abdomen is soft. There is no mass.     Tenderness: There is no abdominal tenderness. There is no guarding or rebound.     Comments: No focal abdominal tenderness, no RLQ tenderness or pain at McBurney's point, no RUQ tenderness or Murphy's sign, no left-sided abdominal tenderness, no fluid wave, or signs of peritonitis   Genitourinary:    Comments: DRE chaperone present, no visible fissures, no external hemorrhoids, no gross blood on exam Musculoskeletal: Normal range of motion.        General: No tenderness.  Skin:    General: Skin is warm and dry.  Neurological:     Mental Status: She is alert and oriented to person, place, and time.  Psychiatric:        Behavior: Behavior normal.        Thought Content: Thought content normal.        Judgment: Judgment normal.      ED Treatments / Results  Labs (all labs ordered are listed, but only abnormal results are displayed) Labs Reviewed  CBC - Abnormal; Notable for the following components:      Result Value   Hemoglobin 10.7 (*)    HCT 35.9 (*)    MCH 23.9 (*)    MCHC 29.8 (*)    RDW 17.2 (*)    All other components within normal limits  POC OCCULT BLOOD, ED - Abnormal; Notable for the following components:   Fecal Occult Bld POSITIVE (*)    All other components within normal limits  I-STAT BETA HCG BLOOD, ED ([facility], [facility], [facility] ONLY)    EKG None  Radiology No results found.  Procedures Procedures (including critical care time)  Medications Ordered in ED Medications - No data  to display   Initial Impression / Assessment and Plan / ED Course  I have reviewed the triage vital signs and the nursing notes.  Pertinent labs & imaging results that were available during my care  of the patient were reviewed by me and considered in my medical decision making (see chart for details).        Patient with vaginal versus rectal bleeding for the past 4 days.  She finished her menstrual cycle 8 days ago.  She is not pregnant.  Her vital signs are stable.  CBC notable for mild anemia, but patient has been anemic in the past before.  She does not have any abdominal pain.  POC occult blood is positive.  Will start omeprazole and recommend GI follow-up.  Final Clinical Impressions(s) / ED Diagnoses   Final diagnoses:  Rectal bleeding    ED Discharge Orders         Ordered    omeprazole (PRILOSEC) 20 MG capsule  Daily     04/24/18 0112           Roxy HorsemanBrowning, Chananya Canizalez, PA-C 04/24/18 0114    Ward, Layla MawKristen N, DO 04/24/18 0120

## 2018-04-24 NOTE — Discharge Instructions (Addendum)
Do not take the medication if pregnant or breast feeding.  The bleeding is coming from your rectum.  Please increase the fiber in your diet and drink more water.  You need to follow-up with a gastroenterologist for further workup.  Please return to the ER for chest pain, shortness of breath, dizziness, or abdominal pain.

## 2018-04-24 NOTE — ED Notes (Signed)
Patient verbalizes understanding of medications and discharge instructions. No further questions at this time. VSS and patient ambulatory at discharge.   

## 2018-05-21 ENCOUNTER — Other Ambulatory Visit: Payer: Self-pay

## 2018-05-21 ENCOUNTER — Encounter (HOSPITAL_COMMUNITY): Payer: Self-pay | Admitting: Emergency Medicine

## 2018-05-21 ENCOUNTER — Emergency Department (HOSPITAL_COMMUNITY)
Admission: EM | Admit: 2018-05-21 | Discharge: 2018-05-21 | Disposition: A | Discharge status: Home / Self Care | Payer: Self-pay | Attending: Emergency Medicine | Admitting: Emergency Medicine

## 2018-05-21 DIAGNOSIS — Z79899 Other long term (current) drug therapy: Secondary | ICD-10-CM | POA: Insufficient documentation

## 2018-05-21 DIAGNOSIS — A638 Other specified predominantly sexually transmitted diseases: Secondary | ICD-10-CM | POA: Insufficient documentation

## 2018-05-21 DIAGNOSIS — Z87891 Personal history of nicotine dependence: Secondary | ICD-10-CM | POA: Insufficient documentation

## 2018-05-21 DIAGNOSIS — A64 Unspecified sexually transmitted disease: Secondary | ICD-10-CM

## 2018-05-21 LAB — WET PREP, GENITAL
Sperm: NONE SEEN
Yeast Wet Prep HPF POC: NONE SEEN

## 2018-05-21 MED ORDER — LIDOCAINE HCL (PF) 1 % IJ SOLN
INTRAMUSCULAR | Status: AC
  Filled 2018-05-21: qty 30

## 2018-05-21 MED ORDER — CEFTRIAXONE SODIUM 250 MG IJ SOLR
250.0000 mg | Freq: Once | INTRAMUSCULAR | Status: AC
  Administered 2018-05-21: 250 mg via INTRAMUSCULAR
  Filled 2018-05-21: qty 250

## 2018-05-21 MED ORDER — AZITHROMYCIN 250 MG PO TABS
1000.0000 mg | ORAL_TABLET | Freq: Once | ORAL | Status: AC
  Administered 2018-05-21: 1000 mg via ORAL
  Filled 2018-05-21: qty 4

## 2018-05-21 MED ORDER — METRONIDAZOLE 500 MG PO TABS
2000.0000 mg | ORAL_TABLET | Freq: Once | ORAL | Status: AC
  Administered 2018-05-21: 2000 mg via ORAL
  Filled 2018-05-21: qty 4

## 2018-05-21 MED ORDER — AZITHROMYCIN 1 G PO PACK: 1.0000 g | PACK | Freq: Once | ORAL | Status: DC

## 2018-05-21 NOTE — Discharge Instructions (Addendum)
Please read attached information. If you experience any new or worsening signs or symptoms please return to the emergency room for evaluation. Please follow-up with your primary care provider or specialist as discussed.  °

## 2018-05-21 NOTE — ED Provider Notes (Addendum)
[facility] EMERGENCY DEPARTMENT Provider Note   CSN: [id number] Arrival date & time: 05/21/18  4098   History   Chief Complaint Chief Complaint  Patient presents with  . Vaginal Bleeding    HPI Lisa Charles is a 33 y.o. female.     HPI   33 year old female presents today with complaints of vaginal discharge and bleeding.  Patient notes approximately 1 month ago she had rectal bleeding, she was uncertain if this was coming from the vagina or the rectum.  She notes that since that time she has developed what she believes is vaginal bleeding and irritation.  She notes that she has blood when wiping her vagina.  She reports she sexually active with female partners.  She denies any abdominal pain fever.  She reports she is not pregnant.    Past Medical History:  Diagnosis Date  . Bronchitis   . Pre-eclampsia     Patient Active Problem List   Diagnosis Date Noted  . Chronic hypertension during pregnancy, antepartum 01/16/2017  . Gestational hypertension 01/16/2017    Past Surgical History:  Procedure Laterality Date  . NO PAST SURGERIES       OB History    Gravida  2   Para  2   Term  2   Preterm  0   AB  0   Living  2     SAB  0   TAB  0   Ectopic  0   Multiple  0   Live Births  2            Home Medications    Prior to Admission medications   Medication Sig Start Date End Date Taking? Authorizing Provider  acetaminophen (TYLENOL) 500 MG tablet Take 500 mg by mouth every 4 (four) hours as needed for moderate pain.     [provider]  benzonatate (TESSALON) 100 MG capsule Take 2 capsules (200 mg total) by mouth every 8 (eight) hours. 01/16/18   Khatri, Hina, PA-C  cetirizine (ZYRTEC) 5 MG tablet Take 1 tablet (5 mg total) by mouth daily. 01/16/18   Khatri, Hina, PA-C  fluticasone (FLONASE) 50 MCG/ACT nasal spray Place 1 spray into both nostrils daily. 01/16/18   Khatri, Hina, PA-C  hydrochlorothiazide (HYDRODIURIL)  25 MG tablet Take 1 tablet (25 mg total) by mouth daily. 01/25/17   Adam Phenix, MD  HYDROcodone-acetaminophen (NORCO/VICODIN) 5-325 MG tablet Take 2 tablets by mouth every 4 (four) hours as needed. 02/16/17   Rolland Porter, MD  ibuprofen (ADVIL,MOTRIN) 600 MG tablet Take 1 tablet (600 mg total) every 6 (six) hours as needed by mouth. 01/19/17   Lockamy, Timothy, DO  norethindrone (MICRONOR,CAMILA,ERRIN) 0.35 MG tablet Take 1 tablet (0.35 mg total) by mouth daily. 02/22/17   Orvilla Cornwall A, CNM  omeprazole (PRILOSEC) 20 MG capsule Take 1 capsule (20 mg total) by mouth daily. 04/24/18   Roxy Horseman, PA-C  ondansetron (ZOFRAN ODT) 4 MG disintegrating tablet Take 1 tablet (4 mg total) by mouth every 8 (eight) hours as needed for nausea. 02/16/17   Rolland Porter, MD  Prenatal Vit-Fe Fumarate-FA (MULTIVITAMIN-PRENATAL) 27-0.8 MG TABS tablet Take 1 tablet by mouth daily at 12 noon.    [provider]    Family History Family History  Problem Relation Age of Onset  . Diabetes Mother   . Hypertension Mother   . Hyperlipidemia Mother   . Gout Father     Social History Social History  Tobacco Use  . Smoking status: Former Smoker    Last attempt to quit: 02/2016    Years since quitting: 2.2  . Smokeless tobacco: Former Engineer, water Use Topics  . Alcohol use: No  . Drug use: No     Allergies   Patient has no known allergies.   Review of Systems Review of Systems  All other systems reviewed and are negative.    Physical Exam Updated Vital Signs BP 116/73 (BP Location: Right Arm)   Pulse 66   Temp 98.1 F (36.7 C) (Oral)   Resp 16   SpO2 100%   Physical Exam Vitals signs and nursing note reviewed.  Constitutional:      Appearance: She is well-developed.  HENT:     Head: Normocephalic and atraumatic.  Eyes:     General: No scleral icterus.       Right eye: No discharge.        Left eye: No discharge.     Conjunctiva/sclera: Conjunctivae normal.      Pupils: Pupils are equal, round, and reactive to light.  Neck:     Musculoskeletal: Normal range of motion.     Vascular: No JVD.     Trachea: No tracheal deviation.  Pulmonary:     Effort: Pulmonary effort is normal.     Breath sounds: No stridor.  Abdominal:     Comments: Abdomen soft nontender  Genitourinary:    Comments: Purulent discharge noted in vaginal vault, no bleeding, no cervical motion tenderness- no blood noted in vaginal vault, cervical os is closed  Neurological:     Mental Status: She is alert and oriented to person, place, and time.     Coordination: Coordination normal.  Psychiatric:        Behavior: Behavior normal.        Thought Content: Thought content normal.        Judgment: Judgment normal.      ED Treatments / Results  Labs (all labs ordered are listed, but only abnormal results are displayed) Labs Reviewed  WET PREP, GENITAL - Abnormal; Notable for the following components:      Result Value   Trich, Wet Prep PRESENT (*)    Clue Cells Wet Prep HPF POC PRESENT (*)    WBC, Wet Prep HPF POC MANY (*)    All other components within normal limits  GC/CHLAMYDIA PROBE AMP ([place]) NOT AT [facility]    EKG None  Radiology No results found.  Procedures Procedures (including critical care time)  Medications Ordered in ED Medications  lidocaine (PF) (XYLOCAINE) 1 % injection (has no administration in time range)  cefTRIAXone (ROCEPHIN) injection 250 mg (250 mg Intramuscular Given 05/21/18 0938)  azithromycin (ZITHROMAX) tablet 1,000 mg (1,000 mg Oral Given 05/21/18 0942)  metroNIDAZOLE (FLAGYL) tablet 2,000 mg (2,000 mg Oral Given 05/21/18 1035)     Initial Impression / Assessment and Plan / ED Course  I have reviewed the triage vital signs and the nursing notes.  Pertinent labs & imaging results that were available during my care of the patient were reviewed by me and considered in my medical decision making (see chart for details).         Patient's presentation most consistent with STD.  She be treated prophylactically, discharged with outpatient follow-up information and return precautions.  She verbalized understanding and agreement to today's plan had no further questions or concerns.  Final Clinical Impressions(s) / ED Diagnoses   Final diagnoses:  STD (  female)    ED Discharge Orders    None       Rosalio Loud 05/21/18 1004    Jacalyn Lefevre, MD 05/21/18 1012    Eyvonne Mechanic, PA-C 05/21/18 1038    Jacalyn Lefevre, MD 05/21/18 1359

## 2018-05-21 NOTE — ED Notes (Signed)
Patient verbalizes understanding of discharge instructions . Opportunity for questions and answers were provided . Armband removed by staff ,Pt discharged from ED. W/C  offered at D/C  and Declined W/C at D/C and was escorted to lobby by RN.  

## 2018-05-21 NOTE — ED Triage Notes (Signed)
Vag bleeding and d/c x 1 month, lmp 3/3 started , deneis dysuria states was seen for same before but they did not do pelvic

## 2018-05-22 LAB — GC/CHLAMYDIA PROBE AMP (~~LOC~~) NOT AT ARMC
Chlamydia: NEGATIVE
Neisseria Gonorrhea: NEGATIVE

## 2018-09-18 ENCOUNTER — Emergency Department (HOSPITAL_COMMUNITY): Payer: HRSA Program

## 2018-09-18 ENCOUNTER — Other Ambulatory Visit: Payer: Self-pay

## 2018-09-18 ENCOUNTER — Emergency Department (HOSPITAL_COMMUNITY)
Admission: EM | Admit: 2018-09-18 | Discharge: 2018-09-19 | Disposition: A | Discharge status: Home / Self Care | Payer: HRSA Program | Attending: Emergency Medicine | Admitting: Emergency Medicine

## 2018-09-18 ENCOUNTER — Encounter (HOSPITAL_COMMUNITY): Payer: Self-pay | Admitting: Family Medicine

## 2018-09-18 DIAGNOSIS — R058 Other specified cough: Secondary | ICD-10-CM

## 2018-09-18 DIAGNOSIS — Z87891 Personal history of nicotine dependence: Secondary | ICD-10-CM | POA: Diagnosis not present

## 2018-09-18 DIAGNOSIS — R519 Headache, unspecified: Secondary | ICD-10-CM

## 2018-09-18 DIAGNOSIS — R51 Headache: Secondary | ICD-10-CM | POA: Insufficient documentation

## 2018-09-18 DIAGNOSIS — U071 COVID-19: Secondary | ICD-10-CM

## 2018-09-18 DIAGNOSIS — R509 Fever, unspecified: Secondary | ICD-10-CM | POA: Diagnosis present

## 2018-09-18 DIAGNOSIS — R05 Cough: Secondary | ICD-10-CM | POA: Insufficient documentation

## 2018-09-18 LAB — CBC
HCT: 36.9 % (ref 36.0–46.0)
Hemoglobin: 11.1 g/dL — ABNORMAL LOW (ref 12.0–15.0)
MCH: 24.6 pg — ABNORMAL LOW (ref 26.0–34.0)
MCHC: 30.1 g/dL (ref 30.0–36.0)
MCV: 81.8 fL (ref 80.0–100.0)
Platelets: 250 10*3/uL (ref 150–400)
RBC: 4.51 MIL/uL (ref 3.87–5.11)
RDW: 17 % — ABNORMAL HIGH (ref 11.5–15.5)
WBC: 7.8 10*3/uL (ref 4.0–10.5)
nRBC: 0 % (ref 0.0–0.2)

## 2018-09-18 LAB — LACTIC ACID, PLASMA: Lactic Acid, Venous: 0.7 mmol/L (ref 0.5–1.9)

## 2018-09-18 LAB — COMPREHENSIVE METABOLIC PANEL
ALT: 26 U/L (ref 0–44)
AST: 34 U/L (ref 15–41)
Albumin: 4.1 g/dL (ref 3.5–5.0)
Alkaline Phosphatase: 66 U/L (ref 38–126)
Anion gap: 10 (ref 5–15)
BUN: 13 mg/dL (ref 6–20)
CO2: 21 mmol/L — ABNORMAL LOW (ref 22–32)
Calcium: 8.7 mg/dL — ABNORMAL LOW (ref 8.9–10.3)
Chloride: 104 mmol/L (ref 98–111)
Creatinine, Ser: 0.67 mg/dL (ref 0.44–1.00)
GFR calc Af Amer: >60 mL/min (ref >60)
GFR calc non Af Amer: >60 mL/min (ref >60)
Glucose, Bld: 102 mg/dL — ABNORMAL HIGH (ref 70–99)
Potassium: 3.5 mmol/L (ref 3.5–5.1)
Sodium: 135 mmol/L (ref 135–145)
Total Bilirubin: 0.1 mg/dL — ABNORMAL LOW (ref 0.3–1.2)
Total Protein: 8.1 g/dL (ref 6.5–8.1)

## 2018-09-18 MED ORDER — ACETAMINOPHEN 500 MG PO TABS
1000.0000 mg | ORAL_TABLET | Freq: Once | ORAL | Status: AC
  Administered 2018-09-18: 1000 mg via ORAL
  Filled 2018-09-18: qty 2

## 2018-09-18 NOTE — ED Triage Notes (Signed)
While triaging, patient started vomiting.

## 2018-09-18 NOTE — ED Triage Notes (Signed)
Patient is complaining of a fever, headache, and nausea. Patient states the headache started Thursday, fever started today, and nausea started today. Denies any medication for symptoms. No known exposure to COVID.

## 2018-09-18 NOTE — ED Provider Notes (Addendum)
[facility]-EMERGENCY DEPT Provider Note   CSN: [id number] Arrival date & time: 09/18/18  2002     History   Chief Complaint Chief Complaint  Patient presents with  . Headache  . Fever    HPI Lisa Charles is a 33 y.o. female.     Patient c/o fever, onset today. Symptoms acute onset, moderate, persistent. States started with non prod cough, mild dull frontal headache, in the past 2-3 days. Denies specific known covid+ exposure. Denies sore throat or runny nose. No neck pain/stiffness. No chest pain or sob. No abd pain or nvd. No dysuria or gu c/o. No rash/skin lesions.   The history is provided by the patient.  Headache Associated symptoms: cough, fever and nausea   Associated symptoms: no abdominal pain, no back pain, no diarrhea, no eye pain, no neck pain, no neck stiffness, no numbness, no sore throat, no vomiting and no weakness   Fever Associated symptoms: cough, headaches and nausea   Associated symptoms: no chest pain, no chills, no confusion, no diarrhea, no dysuria, no rash, no sore throat and no vomiting     Past Medical History:  Diagnosis Date  . Bronchitis   . Pre-eclampsia     Patient Active Problem List   Diagnosis Date Noted  . Chronic hypertension during pregnancy, antepartum 01/16/2017  . Gestational hypertension 01/16/2017    Past Surgical History:  Procedure Laterality Date  . NO PAST SURGERIES       OB History    Gravida  2   Para  2   Term  2   Preterm  0   AB  0   Living  2     SAB  0   TAB  0   Ectopic  0   Multiple  0   Live Births  2            Home Medications    Prior to Admission medications   Medication Sig Start Date End Date Taking? Authorizing Provider  acetaminophen (TYLENOL) 500 MG tablet Take 500 mg by mouth every 4 (four) hours as needed for moderate pain.    Yes [provider]  ibuprofen (ADVIL) 200 MG tablet Take 200 mg by mouth every 6 (six) hours as needed for  headache.   Yes [provider]  benzonatate (TESSALON) 100 MG capsule Take 2 capsules (200 mg total) by mouth every 8 (eight) hours. Patient not taking: Reported on 09/18/2018 01/16/18   Dietrich PatesKhatri, Hina, PA-C  cetirizine (ZYRTEC) 5 MG tablet Take 1 tablet (5 mg total) by mouth daily. Patient not taking: Reported on 09/18/2018 01/16/18   Dietrich PatesKhatri, Hina, PA-C  fluticasone (FLONASE) 50 MCG/ACT nasal spray Place 1 spray into both nostrils daily. Patient not taking: Reported on 09/18/2018 01/16/18   Dietrich PatesKhatri, Hina, PA-C  hydrochlorothiazide (HYDRODIURIL) 25 MG tablet Take 1 tablet (25 mg total) by mouth daily. Patient not taking: Reported on 09/18/2018 01/25/17   Adam PhenixArnold, James G, MD  HYDROcodone-acetaminophen (NORCO/VICODIN) 5-325 MG tablet Take 2 tablets by mouth every 4 (four) hours as needed. Patient not taking: Reported on 09/18/2018 02/16/17   Rolland PorterJames, Mark, MD  ibuprofen (ADVIL,MOTRIN) 600 MG tablet Take 1 tablet (600 mg total) every 6 (six) hours as needed by mouth. Patient not taking: Reported on 09/18/2018 01/19/17   Arlyce HarmanLockamy, Timothy, DO  norethindrone (MICRONOR,CAMILA,ERRIN) 0.35 MG tablet Take 1 tablet (0.35 mg total) by mouth daily. Patient not taking: Reported on 09/18/2018 02/22/17   Roe Coombsenney, Rachelle A,  CNM  omeprazole (PRILOSEC) 20 MG capsule Take 1 capsule (20 mg total) by mouth daily. Patient not taking: Reported on 09/18/2018 04/24/18   Montine Circle, PA-C  ondansetron (ZOFRAN ODT) 4 MG disintegrating tablet Take 1 tablet (4 mg total) by mouth every 8 (eight) hours as needed for nausea. Patient not taking: Reported on 09/18/2018 02/16/17   Tanna Furry, MD    Family History Family History  Problem Relation Age of Onset  . Diabetes Mother   . Hypertension Mother   . Hyperlipidemia Mother   . Gout Father     Social History Social History   Tobacco Use  . Smoking status: Former Smoker    Quit date: 02/2016    Years since quitting: 2.6  . Smokeless tobacco: Former Chief Strategy [employer] Use Topics  . Alcohol use: No  . Drug use: No     Allergies   Patient has no known allergies.   Review of Systems Review of Systems  Constitutional: Positive for fever. Negative for chills.  HENT: Negative for sore throat.   Eyes: Negative for pain and visual disturbance.  Respiratory: Positive for cough. Negative for shortness of breath.   Cardiovascular: Negative for chest pain.  Gastrointestinal: Positive for nausea. Negative for abdominal pain, diarrhea and vomiting.  Endocrine: Negative for polyuria.  Genitourinary: Negative for dysuria and flank pain.  Musculoskeletal: Negative for back pain, neck pain and neck stiffness.  Skin: Negative for rash.  Neurological: Positive for headaches. Negative for weakness and numbness.  Hematological: Does not bruise/bleed easily.  Psychiatric/Behavioral: Negative for confusion.     Physical Exam Updated Vital Signs BP (!) 162/83 (BP Location: Right Arm)   Pulse 98   Temp (!) 101.5 F (38.6 C) (Oral)   Resp 18   Ht 1.778 m (5\' 10" )   Wt (!) 145.2 kg   LMP 09/14/2018   SpO2 95%   BMI 45.92 kg/m   Physical Exam Vitals signs and nursing note reviewed.  Constitutional:      Appearance: Normal appearance. She is well-developed.  HENT:     Head: Atraumatic.     Comments: No sinus or temporal tenderness.     Nose: Nose normal.     Mouth/Throat:     Mouth: Mucous membranes are moist.  Eyes:     General: No scleral icterus.    Conjunctiva/sclera: Conjunctivae normal.     Pupils: Pupils are equal, round, and reactive to light.  Neck:     Musculoskeletal: Normal range of motion and neck supple. No neck rigidity or muscular tenderness.     Trachea: No tracheal deviation.     Comments: No stiffness or rigidity.  Cardiovascular:     Rate and Rhythm: Normal rate and regular rhythm.     Pulses: Normal pulses.     Heart sounds: Normal heart sounds. No murmur. No friction rub. No gallop.   Pulmonary:     Effort:  Pulmonary effort is normal. No respiratory distress.     Breath sounds: Normal breath sounds.  Abdominal:     General: Bowel sounds are normal. There is no distension.     Palpations: Abdomen is soft.     Tenderness: There is no abdominal tenderness. There is no guarding.  Genitourinary:    Comments: No cva tenderness.  Musculoskeletal:        General: No swelling or tenderness.  Lymphadenopathy:     Cervical: No cervical adenopathy.  Skin:    General: Skin is warm and dry.  Findings: No rash.  Neurological:     Mental Status: She is alert.     Comments: Alert, speech normal/fluent. Motor/sens grossly intact bil. Steady gait.   Psychiatric:        Mood and Affect: Mood normal.      ED Treatments / Results  Labs (all labs ordered are listed, but only abnormal results are displayed) Results for orders placed or performed during the hospital encounter of 09/18/18  Comprehensive metabolic panel  Result Value Ref Range   Sodium 135 135 - 145 mmol/L   Potassium 3.5 3.5 - 5.1 mmol/L   Chloride 104 98 - 111 mmol/L   CO2 21 (L) 22 - 32 mmol/L   Glucose, Bld 102 (H) 70 - 99 mg/dL   BUN 13 6 - 20 mg/dL   Creatinine, Ser 1.610.67 0.44 - 1.00 mg/dL   Calcium 8.7 (L) 8.9 - 10.3 mg/dL   Total Protein 8.1 6.5 - 8.1 g/dL   Albumin 4.1 3.5 - 5.0 g/dL   AST 34 15 - 41 U/L   ALT 26 0 - 44 U/L   Alkaline Phosphatase 66 38 - 126 U/L   Total Bilirubin 0.1 (L) 0.3 - 1.2 mg/dL   GFR calc non Af Amer >60 >60 mL/min   GFR calc Af Amer >60 >60 mL/min   Anion gap 10 5 - 15  CBC  Result Value Ref Range   WBC 7.8 4.0 - 10.5 K/uL   RBC 4.51 3.87 - 5.11 MIL/uL   Hemoglobin 11.1 (L) 12.0 - 15.0 g/dL   HCT 09.636.9 04.536.0 - 40.946.0 %   MCV 81.8 80.0 - 100.0 fL   MCH 24.6 (L) 26.0 - 34.0 pg   MCHC 30.1 30.0 - 36.0 g/dL   RDW 81.117.0 (H) 91.411.5 - 78.215.5 %   Platelets 250 150 - 400 K/uL   nRBC 0.0 0.0 - 0.2 %  Lactic acid, plasma  Result Value Ref Range   Lactic Acid, Venous 0.7 0.5 - 1.9 mmol/L    EKG EKG  Interpretation  Date/Time:  Tuesday September 18 2018 21:21:26 EDT Ventricular Rate:  92 PR Interval:    QRS Duration: 97 QT Interval:  353 QTC Calculation: 437 R Axis:   85 Text Interpretation:  Sinus rhythm No significant change since last tracing Confirmed by Cathren LaineSteinl, Iris Hairston ([id number]) on 09/18/2018 9:42:50 PM   Radiology No results found.  Procedures Procedures (including critical care time)  Medications Ordered in ED Medications - No data to display   Initial Impression / Assessment and Plan / ED Course  I have reviewed the triage vital signs and the nursing notes.  Pertinent labs & imaging results that were available during my care of the patient were reviewed by me and considered in my medical decision making (see chart for details).  Iv ns. Labs. Cxr. covid test sent.  Reviewed nursing notes and prior charts for additional history.   Lisa Charles was evaluated in Emergency Department on 09/18/2018 for the symptoms described in the history of present illness. She was evaluated in the context of the global COVID-19 pandemic, which necessitated consideration that the patient might be at risk for infection with the SARS-CoV-2 virus that causes COVID-19. Institutional protocols and algorithms that pertain to the evaluation of patients at risk for COVID-19 are in a state of rapid change based on information released by regulatory bodies including the CDC and federal and state organizations. These policies and algorithms were followed during the patient's care in the ED.  Labs reviewed by me - lactate normal.   Recheck pt, breathing comfortably, no increased wob. Pulse ox 96% room air.   cxr reviewed by me - no pna.   Acetaminophen po. Po fluids.   2340, covid and ua remain pending - signed out to Dr Clayborne DanaMesner to check those results, and dispo appropriately.      Final Clinical Impressions(s) / ED Diagnoses   Final diagnoses:  None    ED Discharge Orders    None           Cathren LaineSteinl, Chrisanna Mishra, MD 09/18/18 2341

## 2018-09-19 LAB — URINALYSIS, ROUTINE W REFLEX MICROSCOPIC
Bilirubin Urine: NEGATIVE
Glucose, UA: NEGATIVE mg/dL
Ketones, ur: NEGATIVE mg/dL
Leukocytes,Ua: NEGATIVE
Nitrite: NEGATIVE
Protein, ur: NEGATIVE mg/dL
RBC / HPF: >50 RBC/hpf — ABNORMAL HIGH (ref 0–5)
Specific Gravity, Urine: 1.008 (ref 1.005–1.030)
pH: 6 (ref 5.0–8.0)

## 2018-09-19 LAB — SARS CORONAVIRUS 2 BY RT PCR (HOSPITAL ORDER, PERFORMED IN ~~LOC~~ HOSPITAL LAB): SARS Coronavirus 2: POSITIVE — AB

## 2018-09-19 MED ORDER — ONDANSETRON HCL 4 MG PO TABS: 4.0000 mg | ORAL_TABLET | Freq: Three times a day (TID) | ORAL | 0 refills | Status: DC | PRN

## 2018-09-19 NOTE — ED Provider Notes (Signed)
2:15 AM Assumed care from Dr. Ashok Cordia, please see their note for full history, physical and decision making until this point. In brief this is a 33 y.o. year old female who presented to the ED tonight with Headache and Fever     Pending urinalysis and coronavirus test and reevaluation.  Patient found to be COVID positive.  She has some blood in her urine but no other evidence of infection this is likely from being on her period.  Discussed quarantine and reasons to return the emergency department.  Patient is not tachypneic, hypoxic or any other distress requiring admission to the hospital.  Discharge instructions, including strict return precautions for new or worsening symptoms, given. Patient and/or family verbalized understanding and agreement with the plan as described.   Labs, studies and imaging reviewed by myself and considered in medical decision making if ordered. Imaging interpreted by radiology.  Labs Reviewed  SARS CORONAVIRUS 2 (HOSPITAL ORDER, [facility]) - Abnormal; Notable for the following components:      Result Value   SARS Coronavirus 2 POSITIVE (*)    All other components within normal limits  COMPREHENSIVE METABOLIC PANEL - Abnormal; Notable for the following components:   CO2 21 (*)    Glucose, Bld 102 (*)    Calcium 8.7 (*)    Total Bilirubin 0.1 (*)    All other components within normal limits  CBC - Abnormal; Notable for the following components:   Hemoglobin 11.1 (*)    MCH 24.6 (*)    RDW 17.0 (*)    All other components within normal limits  URINALYSIS, ROUTINE W REFLEX MICROSCOPIC - Abnormal; Notable for the following components:   Hgb urine dipstick LARGE (*)    RBC / HPF >50 (*)    Bacteria, UA RARE (*)    All other components within normal limits  LACTIC ACID, PLASMA    DG Chest Port 1 View  Final Result      No follow-ups on file.    Bobbie Valletta, Corene Cornea, MD 09/19/18 [phone]

## 2018-10-03 ENCOUNTER — Other Ambulatory Visit: Payer: Self-pay

## 2018-10-03 DIAGNOSIS — Z20822 Contact with and (suspected) exposure to covid-19: Secondary | ICD-10-CM

## 2018-10-04 LAB — NOVEL CORONAVIRUS, NAA: SARS-CoV-2, NAA: NOT DETECTED

## 2019-02-06 ENCOUNTER — Encounter (HOSPITAL_COMMUNITY): Payer: Self-pay | Admitting: Emergency Medicine

## 2019-02-06 ENCOUNTER — Other Ambulatory Visit: Payer: Self-pay

## 2019-02-06 ENCOUNTER — Emergency Department (HOSPITAL_COMMUNITY)
Admission: EM | Admit: 2019-02-06 | Discharge: 2019-02-06 | Discharge status: Against Medical Advice | Payer: Self-pay | Attending: Emergency Medicine | Admitting: Emergency Medicine

## 2019-02-06 DIAGNOSIS — N939 Abnormal uterine and vaginal bleeding, unspecified: Secondary | ICD-10-CM | POA: Insufficient documentation

## 2019-02-06 DIAGNOSIS — Z5320 Procedure and treatment not carried out because of patient's decision for unspecified reasons: Secondary | ICD-10-CM | POA: Insufficient documentation

## 2019-02-06 DIAGNOSIS — Z87891 Personal history of nicotine dependence: Secondary | ICD-10-CM | POA: Insufficient documentation

## 2019-02-06 NOTE — ED Notes (Addendum)
Pt states she wants to leave and she will return if things worsened. Pt signed [place] discharge.

## 2019-02-06 NOTE — ED Triage Notes (Signed)
LMP 11/13, then started again Monday 11/30. Never had issues menstrual and wanting to know what is going on.

## 2019-02-06 NOTE — ED Provider Notes (Signed)
[facility]-EMERGENCY DEPT Provider Note   CSN: [id number] Arrival date & time: 02/06/19  0818     History   Chief Complaint Chief Complaint  Patient presents with  . Vaginal Bleeding    HPI Lisa Charles is a 33 y.o. female.  Presents emerged from chief complaint vaginal bleeding.  Patient reports last menstrual period on 1113.  Today noted small amount of vaginal bleeding.  No clots.  Not soaking through any pads, tampons.  States she has had this before but has been a long time.  No associated abdominal pain.  No vaginal discharge.  No fever.  No dysuria or hematuria.  G2 P2, past pregnancies complicated by preeclampsia but no other known medical problems chronically.     HPI  Past Medical History:  Diagnosis Date  . Bronchitis   . Pre-eclampsia     Patient Active Problem List   Diagnosis Date Noted  . Chronic hypertension during pregnancy, antepartum 01/16/2017  . Gestational hypertension 01/16/2017    Past Surgical History:  Procedure Laterality Date  . NO PAST SURGERIES       OB History    Gravida  2   Para  2   Term  2   Preterm  0   AB  0   Living  2     SAB  0   TAB  0   Ectopic  0   Multiple  0   Live Births  2            Home Medications    Prior to Admission medications   Medication Sig Start Date End Date Taking? Authorizing Provider  acetaminophen (TYLENOL) 500 MG tablet Take 500 mg by mouth every 4 (four) hours as needed for moderate pain.     [provider]  ibuprofen (ADVIL) 200 MG tablet Take 200 mg by mouth every 6 (six) hours as needed for headache.    [provider]  ondansetron (ZOFRAN) 4 MG tablet Take 1 tablet (4 mg total) by mouth every 8 (eight) hours as needed for nausea or vomiting. 09/19/18   Mesner, Barbara Cower, MD  cetirizine (ZYRTEC) 5 MG tablet Take 1 tablet (5 mg total) by mouth daily. Patient not taking: Reported on 09/18/2018 01/16/18 09/19/18  Dietrich Pates, PA-C   fluticasone (FLONASE) 50 MCG/ACT nasal spray Place 1 spray into both nostrils daily. Patient not taking: Reported on 09/18/2018 01/16/18 09/19/18  Dietrich Pates, PA-C  hydrochlorothiazide (HYDRODIURIL) 25 MG tablet Take 1 tablet (25 mg total) by mouth daily. Patient not taking: Reported on 09/18/2018 01/25/17 09/19/18  Adam Phenix, MD  norethindrone (MICRONOR,CAMILA,ERRIN) 0.35 MG tablet Take 1 tablet (0.35 mg total) by mouth daily. Patient not taking: Reported on 09/18/2018 02/22/17 09/19/18  Roe Coombs, CNM  omeprazole (PRILOSEC) 20 MG capsule Take 1 capsule (20 mg total) by mouth daily. Patient not taking: Reported on 09/18/2018 04/24/18 09/19/18  Roxy Horseman, PA-C    Family History Family History  Problem Relation Age of Onset  . Diabetes Mother   . Hypertension Mother   . Hyperlipidemia Mother   . Gout Father     Social History Social History   Tobacco Use  . Smoking status: Former Smoker    Quit date: 02/2016    Years since quitting: 3.0  . Smokeless tobacco: Former Engineer, water Use Topics  . Alcohol use: No  . Drug use: No     Allergies   Patient has no known allergies.  Review of Systems Review of Systems  Constitutional: Negative for chills and fever.  HENT: Negative for ear pain and sore throat.   Eyes: Negative for pain and visual disturbance.  Respiratory: Negative for cough and shortness of breath.   Cardiovascular: Negative for chest pain and palpitations.  Gastrointestinal: Negative for abdominal pain and vomiting.  Genitourinary: Positive for vaginal bleeding. Negative for dysuria and hematuria.  Musculoskeletal: Negative for arthralgias and back pain.  Skin: Negative for color change and rash.  Neurological: Negative for seizures and syncope.  All other systems reviewed and are negative.    Physical Exam Updated Vital Signs BP 135/76 (BP Location: Right Arm)   Pulse 69   Temp 98.4 F (36.9 C) (Oral)   Resp 17   LMP 02/04/2019    SpO2 100%   Physical Exam Vitals signs and nursing note reviewed.  Constitutional:      General: She is not in acute distress.    Appearance: She is well-developed.  HENT:     Head: Normocephalic and atraumatic.  Eyes:     Conjunctiva/sclera: Conjunctivae normal.  Neck:     Musculoskeletal: Neck supple.  Cardiovascular:     Rate and Rhythm: Normal rate and regular rhythm.     Heart sounds: No murmur.  Pulmonary:     Effort: Pulmonary effort is normal. No respiratory distress.     Breath sounds: Normal breath sounds.  Abdominal:     Palpations: Abdomen is soft.     Tenderness: There is no abdominal tenderness.  Skin:    General: Skin is warm and dry.  Neurological:     Mental Status: She is alert.      ED Treatments / Results  Labs (all labs ordered are listed, but only abnormal results are displayed) Labs Reviewed - No data to display  EKG None  Radiology No results found.  Procedures Procedures (including critical care time)  Medications Ordered in ED Medications - No data to display   Initial Impression / Assessment and Plan / ED Course  I have reviewed the triage vital signs and the nursing notes.  Pertinent labs & imaging results that were available during my care of the patient were reviewed by me and considered in my medical decision making (see chart for details).        33 year old lady presents to the ER with vaginal bleeding.  On initial exam patient was noted to be very well-appearing with normal vital signs.  She described a relatively small amount of bleeding.  Recommended obtaining a UA, U pregnant, performing speculum exam, bimanual exam to further evaluate with final recommendations regarding treatment and further testing after this was performed.  Patient left before treatment was complete.   Final Clinical Impressions(s) / ED Diagnoses   Final diagnoses:  Vaginal bleeding    ED Discharge Orders    None       Lucrezia Starch,  MD 02/06/19 1228

## 2019-07-08 ENCOUNTER — Ambulatory Visit: Payer: Self-pay | Attending: Internal Medicine

## 2019-07-08 DIAGNOSIS — Z20822 Contact with and (suspected) exposure to covid-19: Secondary | ICD-10-CM

## 2019-07-08 DIAGNOSIS — Z029 Encounter for administrative examinations, unspecified: Secondary | ICD-10-CM | POA: Insufficient documentation

## 2019-07-10 LAB — NOVEL CORONAVIRUS, NAA

## 2019-07-22 ENCOUNTER — Ambulatory Visit: Payer: Self-pay | Attending: Internal Medicine

## 2019-07-22 DIAGNOSIS — Z20822 Contact with and (suspected) exposure to covid-19: Secondary | ICD-10-CM

## 2019-07-23 LAB — NOVEL CORONAVIRUS, NAA: SARS-CoV-2, NAA: NOT DETECTED

## 2019-07-23 LAB — SARS-COV-2, NAA 2 DAY TAT

## 2019-08-08 ENCOUNTER — Emergency Department (HOSPITAL_COMMUNITY)
Admission: EM | Admit: 2019-08-08 | Discharge: 2019-08-08 | Disposition: A | Discharge status: Home / Self Care | Payer: Self-pay | Attending: Emergency Medicine | Admitting: Emergency Medicine

## 2019-08-08 ENCOUNTER — Other Ambulatory Visit: Payer: Self-pay

## 2019-08-08 ENCOUNTER — Encounter (HOSPITAL_COMMUNITY): Payer: Self-pay | Admitting: Emergency Medicine

## 2019-08-08 DIAGNOSIS — R110 Nausea: Secondary | ICD-10-CM | POA: Insufficient documentation

## 2019-08-08 DIAGNOSIS — Z79899 Other long term (current) drug therapy: Secondary | ICD-10-CM | POA: Insufficient documentation

## 2019-08-08 DIAGNOSIS — Z87891 Personal history of nicotine dependence: Secondary | ICD-10-CM | POA: Insufficient documentation

## 2019-08-08 DIAGNOSIS — N390 Urinary tract infection, site not specified: Secondary | ICD-10-CM | POA: Insufficient documentation

## 2019-08-08 LAB — LIPASE, BLOOD: Lipase: 26 U/L (ref 11–51)

## 2019-08-08 LAB — COMPREHENSIVE METABOLIC PANEL
ALT: 15 U/L (ref 0–44)
AST: 19 U/L (ref 15–41)
Albumin: 4.4 g/dL (ref 3.5–5.0)
Alkaline Phosphatase: 57 U/L (ref 38–126)
Anion gap: 11 (ref 5–15)
BUN: 12 mg/dL (ref 6–20)
CO2: 22 mmol/L (ref 22–32)
Calcium: 9.1 mg/dL (ref 8.9–10.3)
Chloride: 105 mmol/L (ref 98–111)
Creatinine, Ser: 0.67 mg/dL (ref 0.44–1.00)
GFR calc Af Amer: >60 mL/min (ref >60)
GFR calc non Af Amer: >60 mL/min (ref >60)
Glucose, Bld: 99 mg/dL (ref 70–99)
Potassium: 3.9 mmol/L (ref 3.5–5.1)
Sodium: 138 mmol/L (ref 135–145)
Total Bilirubin: 0.4 mg/dL (ref 0.3–1.2)
Total Protein: 8.6 g/dL — ABNORMAL HIGH (ref 6.5–8.1)

## 2019-08-08 LAB — URINALYSIS, ROUTINE W REFLEX MICROSCOPIC
Bilirubin Urine: NEGATIVE
Glucose, UA: NEGATIVE mg/dL
Hgb urine dipstick: NEGATIVE
Ketones, ur: NEGATIVE mg/dL
Nitrite: NEGATIVE
Protein, ur: NEGATIVE mg/dL
Specific Gravity, Urine: 1.018 (ref 1.005–1.030)
WBC, UA: >50 WBC/hpf — ABNORMAL HIGH (ref 0–5)
pH: 7 (ref 5.0–8.0)

## 2019-08-08 LAB — CBC
HCT: 37.9 % (ref 36.0–46.0)
Hemoglobin: 12 g/dL (ref 12.0–15.0)
MCH: 26.2 pg (ref 26.0–34.0)
MCHC: 31.7 g/dL (ref 30.0–36.0)
MCV: 82.8 fL (ref 80.0–100.0)
Platelets: 332 10*3/uL (ref 150–400)
RBC: 4.58 MIL/uL (ref 3.87–5.11)
RDW: 15.9 % — ABNORMAL HIGH (ref 11.5–15.5)
WBC: 8.9 10*3/uL (ref 4.0–10.5)
nRBC: 0 % (ref 0.0–0.2)

## 2019-08-08 LAB — I-STAT BETA HCG BLOOD, ED (MC, WL, AP ONLY): I-stat hCG, quantitative: <5 m[IU]/mL (ref <5)

## 2019-08-08 MED ORDER — NITROFURANTOIN MONOHYD MACRO 100 MG PO CAPS: 100.0000 mg | ORAL_CAPSULE | Freq: Two times a day (BID) | ORAL | 0 refills | Status: DC

## 2019-08-08 MED ORDER — SODIUM CHLORIDE 0.9% FLUSH: 3.0000 mL | Freq: Once | INTRAVENOUS | Status: DC

## 2019-08-08 NOTE — Discharge Instructions (Addendum)
You are being prescribed an antibiotic called Macrobid.  You were going to take this twice a day for the next 5 days.  Please do not stop taking this early.  Please return to the emergency department if you develop any new or worsening symptoms.  Please reach out to [facility] community health and wellness to schedule an initial evaluation.

## 2019-08-08 NOTE — ED Provider Notes (Signed)
[facility]-EMERGENCY DEPT Provider Note   CSN: [id number] Arrival date & time: 08/08/19  1017     History Chief Complaint  Patient presents with  . Abdominal Pain    Lisa Charles is a 34 y.o. female.  HPI HPI Comments: Lisa Charles is a 34 y.o. female G2 P2 A0 who presents to the Emergency Department complaining of intermittent, cramping, mild, lower abdominal pain for the past 2 weeks.  She states it occurs daily and last for about 5 minutes.  No modifying factors.  Has not taken any medications for symptoms.  She states these types of symptoms typically occur when she starts her menstrual cycle.  Her last normal menstrual period was on April 22.  Patient states she is sexually active with 1 female partner and they intermittently use condoms for protection.  She also reports some mild intermittent nausea without vomiting.  No diarrhea or constipation.  Occasional alcohol use.  No smoking.  No drug use.  She received her first Moderna vaccination last month and is scheduled for second dose on June 10. No fevers, chills, URI sx, CP, SOB, vaginal bleeding, vaginal discharge, dysuria, hematuria, syncope, dizziness.      Past Medical History:  Diagnosis Date  . Bronchitis   . Pre-eclampsia     Patient Active Problem List   Diagnosis Date Noted  . Chronic hypertension during pregnancy, antepartum 01/16/2017  . Gestational hypertension 01/16/2017    Past Surgical History:  Procedure Laterality Date  . NO PAST SURGERIES       OB History    Gravida  2   Para  2   Term  2   Preterm  0   AB  0   Living  2     SAB  0   TAB  0   Ectopic  0   Multiple  0   Live Births  2           Family History  Problem Relation Age of Onset  . Diabetes Mother   . Hypertension Mother   . Hyperlipidemia Mother   . Gout Father     Social History   Tobacco Use  . Smoking status: Former Smoker    Quit date: 02/2016    Years since quitting: 3.5  .  Smokeless tobacco: Former Engineer, water Use Topics  . Alcohol use: No  . Drug use: No    Home Medications Prior to Admission medications   Medication Sig Start Date End Date Taking? Authorizing Provider  acetaminophen (TYLENOL) 500 MG tablet Take 500 mg by mouth every 4 (four) hours as needed for moderate pain.     [provider]  ibuprofen (ADVIL) 200 MG tablet Take 200 mg by mouth every 6 (six) hours as needed for headache.    [provider]  ondansetron (ZOFRAN) 4 MG tablet Take 1 tablet (4 mg total) by mouth every 8 (eight) hours as needed for nausea or vomiting. Patient not taking: Reported on 02/06/2019 09/19/18   Mesner, Barbara Cower, MD  cetirizine (ZYRTEC) 5 MG tablet Take 1 tablet (5 mg total) by mouth daily. Patient not taking: Reported on 09/18/2018 01/16/18 09/19/18  Dietrich Pates, PA-C  fluticasone (FLONASE) 50 MCG/ACT nasal spray Place 1 spray into both nostrils daily. Patient not taking: Reported on 09/18/2018 01/16/18 09/19/18  Dietrich Pates, PA-C  hydrochlorothiazide (HYDRODIURIL) 25 MG tablet Take 1 tablet (25 mg total) by mouth daily. Patient not taking: Reported on 09/18/2018 01/25/17  09/19/18  Adam Phenix, MD  norethindrone (MICRONOR,CAMILA,ERRIN) 0.35 MG tablet Take 1 tablet (0.35 mg total) by mouth daily. Patient not taking: Reported on 09/18/2018 02/22/17 09/19/18  Roe Coombs, CNM  omeprazole (PRILOSEC) 20 MG capsule Take 1 capsule (20 mg total) by mouth daily. Patient not taking: Reported on 09/18/2018 04/24/18 09/19/18  Roxy Horseman, PA-C    Allergies    Patient has no known allergies.  Review of Systems   Review of Systems  All other systems reviewed and are negative. Ten systems reviewed and are negative for acute change, except as noted in the HPI.    Physical Exam Updated Vital Signs BP 105/76 (BP Location: Left Arm)   Pulse 77   Temp 98.2 F (36.8 C) (Oral)   Resp 16   LMP 06/27/2019   SpO2 99%   Physical Exam Vitals and  nursing note reviewed.  Constitutional:      General: She is not in acute distress.    Appearance: Normal appearance. She is well-developed. She is obese. She is not ill-appearing, toxic-appearing or diaphoretic.  HENT:     Head: Normocephalic and atraumatic.     Right Ear: External ear normal.     Left Ear: External ear normal.     Nose: Nose normal.     Mouth/Throat:     Mouth: Mucous membranes are moist.     Pharynx: Oropharynx is clear. No oropharyngeal exudate or posterior oropharyngeal erythema.  Eyes:     General: No scleral icterus.    Extraocular Movements: Extraocular movements intact.     Pupils: Pupils are equal, round, and reactive to light.  Cardiovascular:     Rate and Rhythm: Normal rate and regular rhythm.     Pulses: Normal pulses.     Heart sounds: Normal heart sounds. No murmur. No friction rub. No gallop.   Pulmonary:     Effort: Pulmonary effort is normal. No respiratory distress.     Breath sounds: Normal breath sounds. No stridor. No wheezing, rhonchi or rales.  Chest:     Chest wall: No tenderness.  Abdominal:     General: Abdomen is flat and protuberant. There is no distension. There are no signs of injury.     Palpations: Abdomen is soft.     Tenderness: There is no abdominal tenderness.     Comments: No tenderness appreciated with deep palpation in all 4 abdominal quadrants.  Exam limited due to body habitus.  Musculoskeletal:        General: Normal range of motion.     Cervical back: Normal range of motion and neck supple. No tenderness.  Skin:    General: Skin is warm and dry.     Capillary Refill: Capillary refill takes less than 2 seconds.  Neurological:     General: No focal deficit present.     Mental Status: She is alert and oriented to person, place, and time.  Psychiatric:        Mood and Affect: Mood normal.        Behavior: Behavior normal.    ED Results / Procedures / Treatments   Labs (all labs ordered are listed, but only  abnormal results are displayed) Labs Reviewed  COMPREHENSIVE METABOLIC PANEL - Abnormal; Notable for the following components:      Result Value   Total Protein 8.6 (*)    All other components within normal limits  CBC - Abnormal; Notable for the following components:   RDW 15.9 (*)  All other components within normal limits  URINALYSIS, ROUTINE W REFLEX MICROSCOPIC - Abnormal; Notable for the following components:   APPearance HAZY (*)    Leukocytes,Ua MODERATE (*)    WBC, UA >50 (*)    Bacteria, UA RARE (*)    All other components within normal limits  URINE CULTURE  LIPASE, BLOOD  I-STAT BETA HCG BLOOD, ED ([facility], [facility], [facility] ONLY)    EKG None  Radiology No results found.  Procedures Procedures (including critical care time)  Medications Ordered in ED Medications - No data to display ED Course  I have reviewed the triage vital signs and the nursing notes.  Pertinent labs & imaging results that were available during my care of the patient were reviewed by me and considered in my medical decision making (see chart for details).  Clinical Course as of Aug 08 1146  Thu Aug 08, 2019  1101 Patient is a 34 year old female with intermittent suprapubic pain.  No pain on exam.  Her last menstrual period was about 6 weeks ago.  It was normal at that time.  She is sexually active and intermittently uses protection.  UA has already resulted showing moderate leukocytes, greater than 50 WBCs, rare bacteria.  Will obtain some basic labs.  Pregnancy test.  Will reassess.   [LJ]  9528 Remaining labs are reassuring.  I-STAT beta-hCG is negative.  Negative lipase.  Discussed this with patient.  We will go ahead and treat for UTI based on her symptoms and UA findings.  We will also give patient a referral to [facility] community health and wellness, since she does not have health insurance or a PCP.  She was amenable with this.  Her questions were answered and she was amicable at the time of discharge.   Her vital signs are stable.   [LJ]    Clinical Course User Index [LJ] Rayna Sexton, PA-C   MDM Rules/Calculators/A&P                      Patient is a 34 year old female with history, physical exam, ED clinical course as noted above.  Patient discharged to home/self care.  Condition at discharge: Stable  Note: Portions of this report may have been transcribed using voice recognition software. Every effort was made to ensure accuracy; however, inadvertent computerized transcription errors may be present.    Final Clinical Impression(s) / ED Diagnoses Final diagnoses:  Lower urinary tract infectious disease    Rx / DC Orders ED Discharge Orders         Ordered    nitrofurantoin, macrocrystal-monohydrate, (MACROBID) 100 MG capsule  2 times daily     08/08/19 1149           Rayna Sexton, PA-C 08/08/19 1152    Wyvonnia Dusky, MD 08/08/19 1756

## 2019-08-08 NOTE — ED Triage Notes (Signed)
Intermittent lower abd pains for 2 weeks. Denies v/d or urinary problems.

## 2019-08-10 LAB — URINE CULTURE: Culture: >=100000 — AB

## 2019-08-11 ENCOUNTER — Telehealth: Payer: Self-pay | Admitting: Emergency Medicine

## 2019-08-11 NOTE — Progress Notes (Signed)
ED Antimicrobial Stewardship Positive Culture Follow Up   Lisa Charles is an 34 y.o. female who presented to [facility] on 08/08/2019 with a chief complaint of  Chief Complaint  Patient presents with  . Abdominal Pain    Recent Results (from the past 720 hour(s))  Novel Coronavirus, NAA ([facility])     Status: None   Collection Time: 07/22/19  8:50 AM   Specimen: Nasopharyngeal(NP) swabs in vial transport medium   NASOPHARYNGE  TESTING  Result Value Ref Range Status   SARS-CoV-2, NAA Not Detected Not Detected Final    Comment: This nucleic acid amplification test was developed and its performance characteristics determined by [employer]. Nucleic acid amplification tests include RT-PCR and TMA. This test has not been FDA cleared or approved. This test has been authorized by FDA under an Emergency Use Authorization (EUA). This test is only authorized for the duration of time the declaration that circumstances exist justifying the authorization of the emergency use of in vitro diagnostic tests for detection of SARS-CoV-2 virus and/or diagnosis of COVID-19 infection under section 564(b)(1) of the Act, 21 U.S.C. 063KZS-0(F) (1), unless the authorization is terminated or revoked sooner. When diagnostic testing is negative, the possibility of a false negative result should be considered in the context of a patient's recent exposures and the presence of clinical signs and symptoms consistent with COVID-19. An individual without symptoms of COVID-19 and who is not shedding SARS-CoV-2 virus wo uld expect to have a negative (not detected) result in this assay.   SARS-COV-2, NAA 2 DAY TAT     Status: None   Collection Time: 07/22/19  8:50 AM   NASOPHARYNGE  TESTING  Result Value Ref Range Status   SARS-CoV-2, NAA 2 DAY TAT Performed  Final  Urine culture     Status: Abnormal   Collection Time: 08/08/19 10:45 AM   Specimen: Urine, Clean Catch  Result Value Ref Range Status   Specimen Description   Final    URINE, CLEAN CATCH Performed at [facility], [address], [place], [place] [address]    Special Requests   Final    NONE Performed at [facility], [address] [place], [place] [address]    Culture >=100,000 COLONIES/mL PROTEUS MIRABILIS (A)  Final   Report Status 08/10/2019 FINAL  Final   Organism ID, Bacteria PROTEUS MIRABILIS (A)  Final      Susceptibility   Proteus mirabilis - MIC*    AMPICILLIN <=2 SENSITIVE Sensitive     CEFAZOLIN <=4 SENSITIVE Sensitive     CEFTRIAXONE <=1 SENSITIVE Sensitive     CIPROFLOXACIN <=0.25 SENSITIVE Sensitive     GENTAMICIN <=1 SENSITIVE Sensitive     IMIPENEM 2 SENSITIVE Sensitive     NITROFURANTOIN 128 RESISTANT Resistant     TRIMETH/SULFA <=20 SENSITIVE Sensitive     AMPICILLIN/SULBACTAM <=2 SENSITIVE Sensitive     PIP/TAZO <=4 SENSITIVE Sensitive     * >=100,000 COLONIES/mL PROTEUS MIRABILIS    [x]  Treated with Nitrofurantoin, organism resistant to prescribed antimicrobial []  Patient discharged originally without antimicrobial agent and treatment is now indicated  New antibiotic prescription: Keflex 500mg  po tid x 7 days  ED Provider: , PA   08/11/2019, 12:09 PM Clinical Pharmacist [phone]

## 2019-08-11 NOTE — Telephone Encounter (Signed)
Post ED Visit - Positive Culture Follow-up: Successful Patient Follow-Up  Culture assessed and recommendations reviewed by:  []  , Pharm.D. []  Enzo Bi, Pharm.D., BCPS AQ-ID []  , Pharm.D., BCPS []  Celedonio Miyamoto, Pharm.D., BCPS []  [place], Garvin Fila.D., BCPS, AAHIVP []  , Pharm.D., BCPS, AAHIVP []  Georgina Pillion, PharmD, BCPS []  , PharmD, BCPS []  [place], PharmD, BCPS [x]  [address], PharmD  Positive urine culture  []  Patient discharged without antimicrobial prescription and treatment is now indicated [x]  Organism is resistant to prescribed ED discharge antimicrobial []  Patient with positive blood cultures  Changes discussed with ED provider: PA New antibiotic prescription Keflex 500 mg PO TID x seven days Called to [facility] ([address]) [phone]  Contacted patient, date 08/11/2019, time 1600   Lisa Charles Lisa Charles 08/11/2019, 5:11 PM

## 2019-09-14 IMAGING — US US ABDOMEN LIMITED
1 series · 14 of 25 positions shown · non-contrast
Comparison: 01/12/2016

CLINICAL DATA: Right upper quadrant pain for 2 months

EXAM:
ULTRASOUND ABDOMEN LIMITED RIGHT UPPER QUADRANT

[Series 1: us abdomen limited · 0.28mm/px · 14 of 60 slices shown]
[im 1/60]
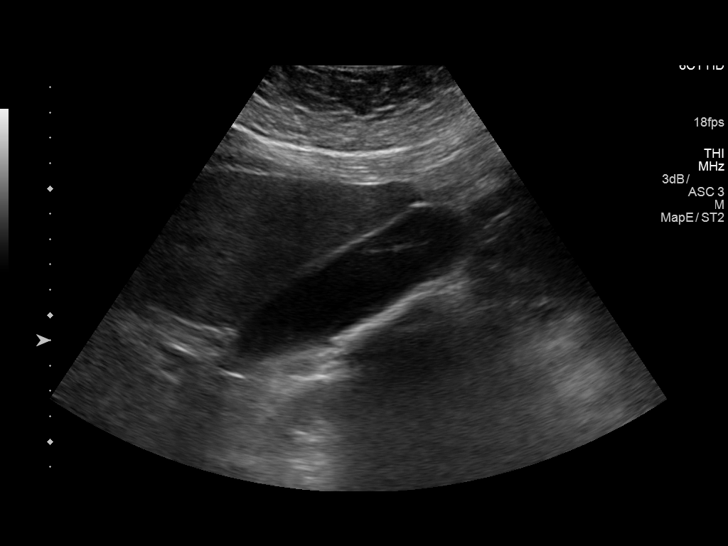
[im 5/60]
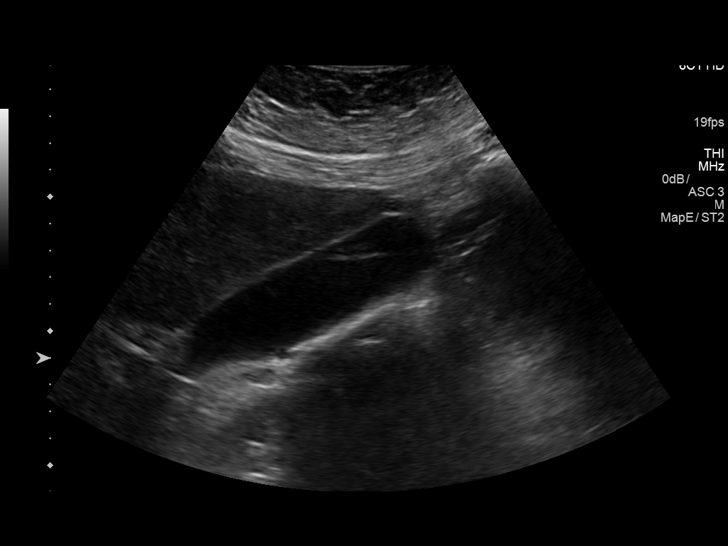
[im 10/60]
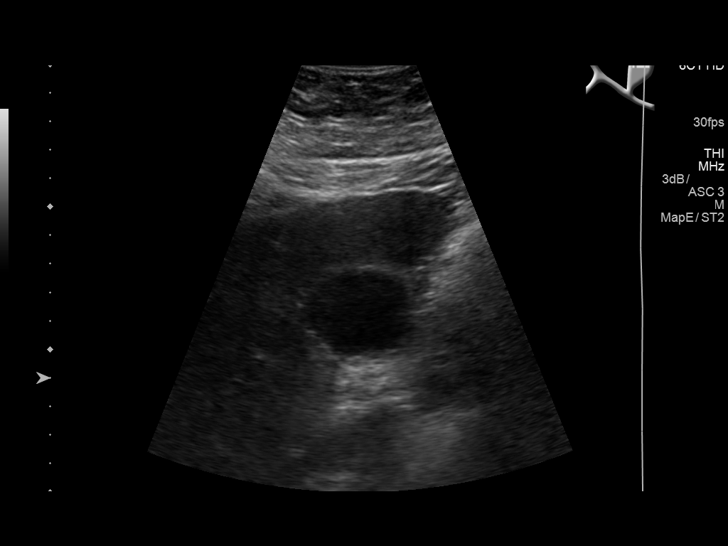
[im 15/60]
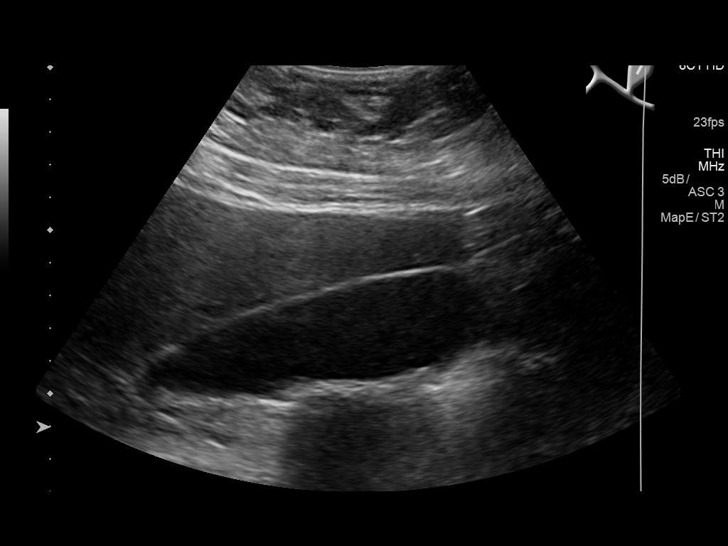
[im 20/60]
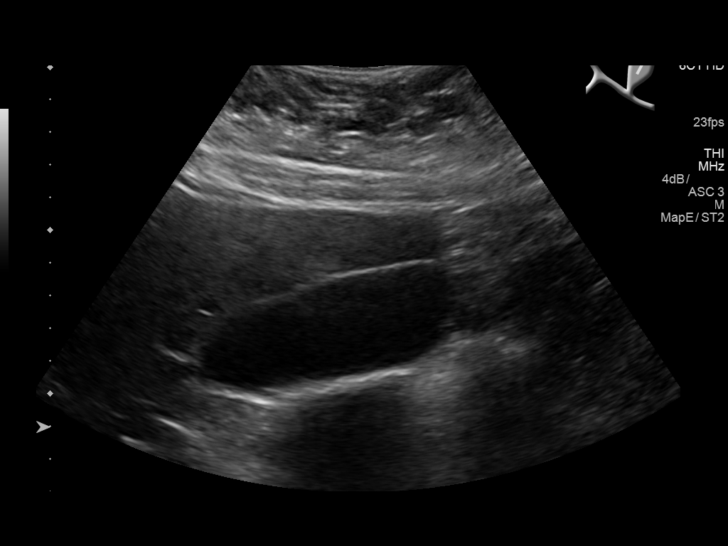
[im 23/60]
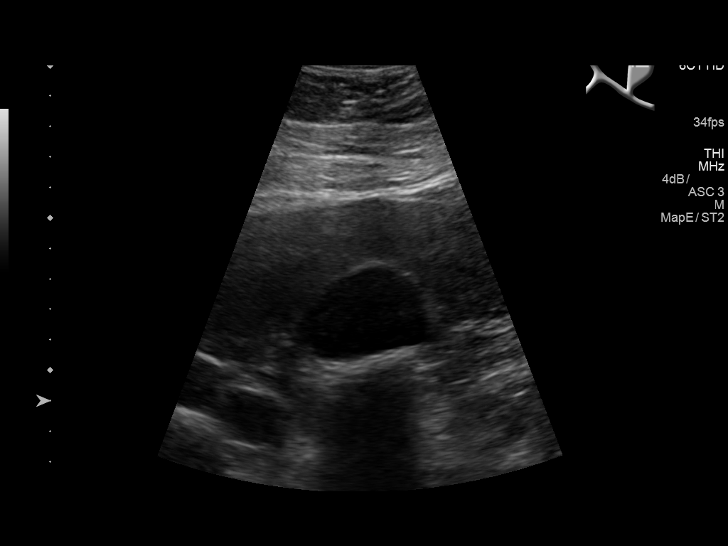
[im 28/60]
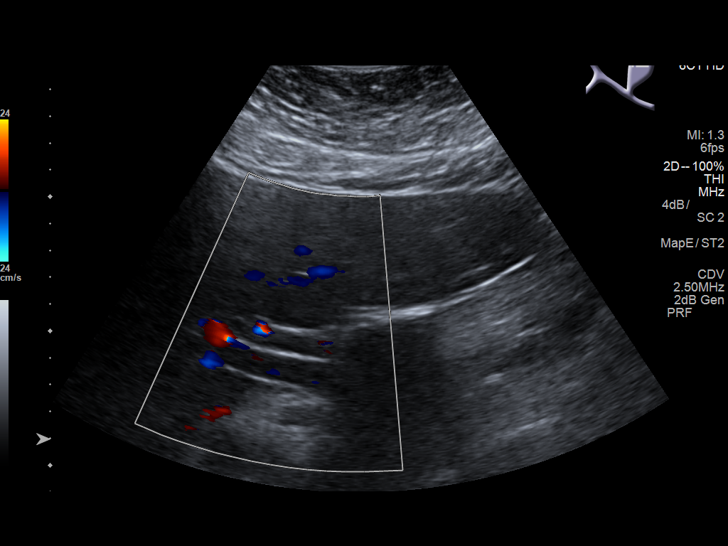
[im 32/60]
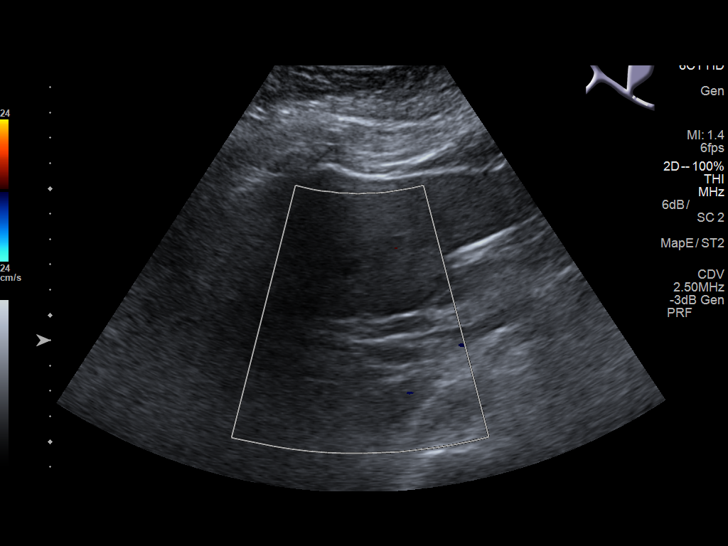
[im 37/60]
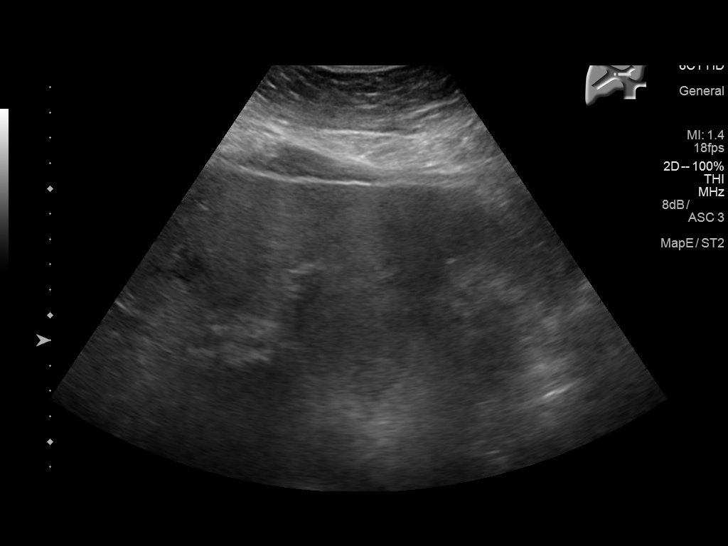
[im 40/60]
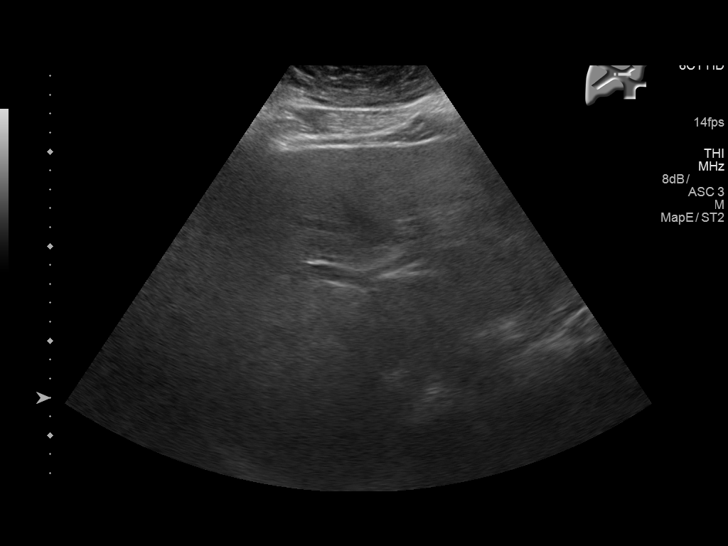
[im 45/60]
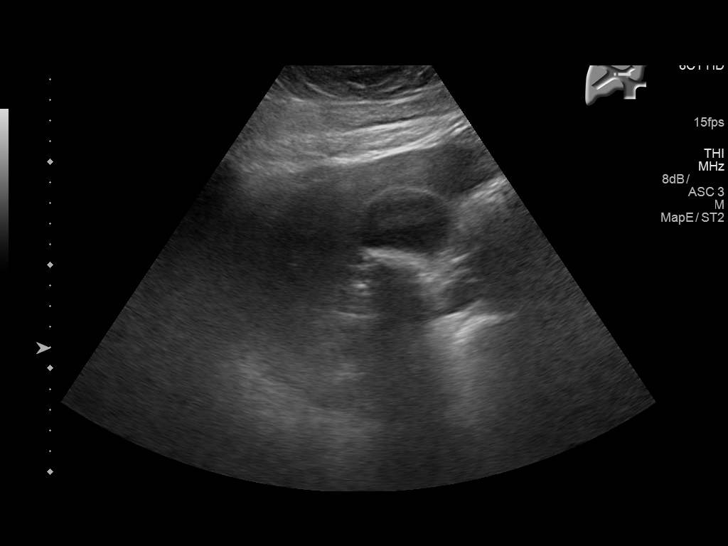
[im 50/60]
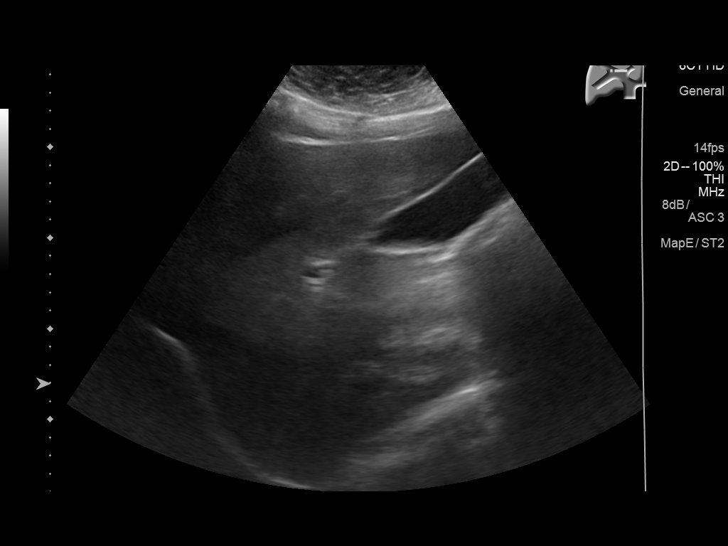
[im 55/60]
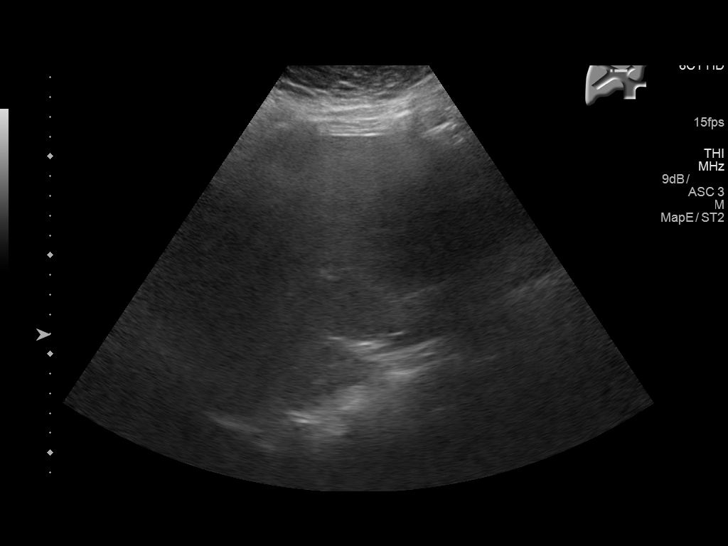
[im 60/60]
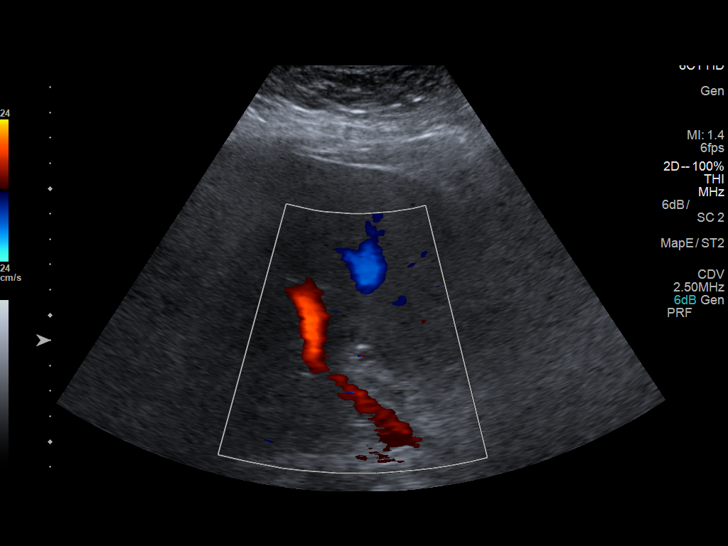

[14 of 25 positions shown; findings below may reference images not displayed]

FINDINGS: Gallbladder:

Multiple small calculi within the gallbladder lumen measuring up to
2.9 mm. No gallbladder mural thickening or pericholecystic fluid.
The patient was not tender to probe pressure over the gallbladder.

Common bile duct:

Diameter: Dilated, 7 mm.

Liver:

No focal lesion identified. Within normal limits in parenchymal
echogenicity. Portal vein is patent on color Doppler imaging with
normal direction of blood flow towards the liver.

There are study limitations due to patient body habitus.
IMPRESSION: Cholelithiasis. Mild dilatation of the common bile duct. Limited
study.

## 2020-03-17 ENCOUNTER — Other Ambulatory Visit: Payer: Self-pay

## 2020-03-17 DIAGNOSIS — Z20822 Contact with and (suspected) exposure to covid-19: Secondary | ICD-10-CM

## 2020-03-19 LAB — SARS-COV-2, NAA 2 DAY TAT

## 2020-03-19 LAB — NOVEL CORONAVIRUS, NAA: SARS-CoV-2, NAA: NOT DETECTED

## 2020-03-25 ENCOUNTER — Telehealth: Payer: Self-pay | Admitting: Family

## 2020-03-25 DIAGNOSIS — J069 Acute upper respiratory infection, unspecified: Secondary | ICD-10-CM

## 2020-03-25 MED ORDER — BENZONATATE 100 MG PO CAPS: 100.0000 mg | ORAL_CAPSULE | Freq: Three times a day (TID) | ORAL | 0 refills | Status: DC | PRN

## 2020-03-25 MED ORDER — FLUTICASONE PROPIONATE 50 MCG/ACT NA SUSP: 2.0000 | Freq: Every day | NASAL | 6 refills | Status: DC

## 2020-03-25 NOTE — Progress Notes (Signed)

## 2020-03-27 ENCOUNTER — Ambulatory Visit (HOSPITAL_COMMUNITY)
Admission: EM | Admit: 2020-03-27 | Discharge: 2020-03-27 | Disposition: A | Discharge status: Home / Self Care | Payer: Self-pay | Attending: Family Medicine | Admitting: Family Medicine

## 2020-03-27 ENCOUNTER — Encounter (HOSPITAL_COMMUNITY): Payer: Self-pay

## 2020-03-27 ENCOUNTER — Other Ambulatory Visit: Payer: Self-pay

## 2020-03-27 DIAGNOSIS — N939 Abnormal uterine and vaginal bleeding, unspecified: Secondary | ICD-10-CM | POA: Insufficient documentation

## 2020-03-27 LAB — CBC
HCT: 37.2 % (ref 36.0–46.0)
Hemoglobin: 12 g/dL (ref 12.0–15.0)
MCH: 26.5 pg (ref 26.0–34.0)
MCHC: 32.3 g/dL (ref 30.0–36.0)
MCV: 82.3 fL (ref 80.0–100.0)
Platelets: 339 10*3/uL (ref 150–400)
RBC: 4.52 MIL/uL (ref 3.87–5.11)
RDW: 15.6 % — ABNORMAL HIGH (ref 11.5–15.5)
WBC: 9.7 10*3/uL (ref 4.0–10.5)
nRBC: 0 % (ref 0.0–0.2)

## 2020-03-27 LAB — POCT URINALYSIS DIPSTICK, ED / UC
Bilirubin Urine: NEGATIVE
Glucose, UA: NEGATIVE mg/dL
Leukocytes,Ua: NEGATIVE
Nitrite: NEGATIVE
Protein, ur: NEGATIVE mg/dL
Specific Gravity, Urine: 1.02 (ref 1.005–1.030)
Urobilinogen, UA: 1 mg/dL (ref 0.0–1.0)
pH: 7.5 (ref 5.0–8.0)

## 2020-03-27 LAB — POC URINE PREG, ED: Preg Test, Ur: NEGATIVE

## 2020-03-27 LAB — IRON AND TIBC
Iron: 43 ug/dL (ref 28–170)
Saturation Ratios: 10 % — ABNORMAL LOW (ref 10.4–31.8)
TIBC: 434 ug/dL (ref 250–450)
UIBC: 391 ug/dL

## 2020-03-27 MED ORDER — NAPROXEN 375 MG PO TABS: 375.0000 mg | ORAL_TABLET | Freq: Two times a day (BID) | ORAL | 0 refills | Status: DC

## 2020-03-27 NOTE — ED Triage Notes (Signed)
Pt presents with abnormal vaginal bleeding for over 2 weeks.

## 2020-03-27 NOTE — Discharge Instructions (Addendum)
Your results will be updated to MyChart.  If any additional treatment is warranted you will be notified by member of our clinical team.  Start naproxen 375 twice daily this should help alleviate symptoms of bleeding.  Your urine analysis was normal and urine pregnancy is negative.  Your STD screening is pending and will result within 3 to 5 days.  We will notify you if any treatment is warranted. Keep follow-up scheduled with primary care provider.

## 2020-03-27 NOTE — ED Provider Notes (Signed)
[facility]    CSN: [id number] Arrival date & time: 03/27/20  1754      History   Chief Complaint Chief Complaint  Patient presents with  . Vaginal Bleeding    HPI Lisa Charles is a 35 y.o. female.   HPI  Patient presents today with 6 weeks of intermittent uterine bleeding.  She reports having a normal menstrual cycle during this period of time with a week off of bleeding and subsequently followed by light spotting.  She is currently not prescribed any oral contraceptives and has not recently changed any medications.  She reports a family history of fibroids but she has never been diagnosed as having any fibroid tumors.  She is not experiencing any lower abdominal pain nor does she experience any painful.'s.  Prior to the first week of December periods have been normal lasting for period of about 7 days.  She denies any recent contact with a new sexual partner or concern for STDs  Past Medical History:  Diagnosis Date  . Bronchitis   . Pre-eclampsia     Patient Active Problem List   Diagnosis Date Noted  . Chronic hypertension during pregnancy, antepartum 01/16/2017  . Gestational hypertension 01/16/2017    Past Surgical History:  Procedure Laterality Date  . NO PAST SURGERIES      OB History    Gravida  2   Para  2   Term  2   Preterm  0   AB  0   Living  2     SAB  0   IAB  0   Ectopic  0   Multiple  0   Live Births  2            Home Medications    Prior to Admission medications   Medication Sig Start Date End Date Taking? Authorizing Provider  benzonatate (TESSALON PERLES) 100 MG capsule Take 1 capsule (100 mg total) by mouth 3 (three) times daily as needed. 03/25/20   Junie Spencer, FNP  fluticasone (FLONASE) 50 MCG/ACT nasal spray Place 2 sprays into both nostrils daily. 03/25/20   Jannifer Rodney A, FNP  ibuprofen (ADVIL) 200 MG tablet Take 800 mg by mouth every 6 (six) hours as needed for mild pain or moderate pain.      [provider]  nitrofurantoin, macrocrystal-monohydrate, (MACROBID) 100 MG capsule Take 1 capsule (100 mg total) by mouth 2 (two) times daily. 08/08/19   Placido Sou, PA-C  ondansetron (ZOFRAN) 4 MG tablet Take 1 tablet (4 mg total) by mouth every 8 (eight) hours as needed for nausea or vomiting. Patient not taking: Reported on 08/08/2019 09/19/18   Mesner, Barbara Cower, MD  cetirizine (ZYRTEC) 5 MG tablet Take 1 tablet (5 mg total) by mouth daily. Patient not taking: Reported on 09/18/2018 01/16/18 09/19/18  Dietrich Pates, PA-C  hydrochlorothiazide (HYDRODIURIL) 25 MG tablet Take 1 tablet (25 mg total) by mouth daily. Patient not taking: Reported on 09/18/2018 01/25/17 09/19/18  Adam Phenix, MD  norethindrone (MICRONOR,CAMILA,ERRIN) 0.35 MG tablet Take 1 tablet (0.35 mg total) by mouth daily. Patient not taking: Reported on 09/18/2018 02/22/17 09/19/18  Roe Coombs, CNM  omeprazole (PRILOSEC) 20 MG capsule Take 1 capsule (20 mg total) by mouth daily. Patient not taking: Reported on 09/18/2018 04/24/18 09/19/18  Roxy Horseman, PA-C    Family History Family History  Problem Relation Age of Onset  . Diabetes Mother   . Hypertension Mother   . Hyperlipidemia Mother   .  Gout Father     Social History Social History   Tobacco Use  . Smoking status: Former Smoker    Quit date: 02/2016    Years since quitting: 4.1  . Smokeless tobacco: Former Clinical biochemist  . Vaping Use: Never used  Substance Use Topics  . Alcohol use: No  . Drug use: No     Allergies   Expectorant cough control [guaifenesin]   Review of Systems Review of Systems Pertinent negatives listed in HPI  Physical Exam Triage Vital Signs ED Triage Vitals  Enc Vitals Group     BP 03/27/20 1803 133/85     Pulse Rate 03/27/20 1803 73     Resp 03/27/20 1803 17     Temp 03/27/20 1803 97.8 F (36.6 C)     Temp Source 03/27/20 1803 Oral     SpO2 03/27/20 1803 98 %     Weight --      Height --       Head Circumference --      Peak Flow --      Pain Score 03/27/20 1802 0     Pain Loc --      Pain Edu? --      Excl. in GC? --    No data found.  Updated Vital Signs BP 133/85 (BP Location: Right Arm)   Pulse 73   Temp 97.8 F (36.6 C) (Oral)   Resp 17   LMP 03/16/2020   SpO2 98%   Visual Acuity Right Eye Distance:   Left Eye Distance:   Bilateral Distance:    Right Eye Near:   Left Eye Near:    Bilateral Near:     Physical Exam Constitutional:      Appearance: Normal appearance.  HENT:     Head: Normocephalic and atraumatic.  Cardiovascular:     Rate and Rhythm: Normal rate and regular rhythm.     Pulses: Normal pulses.  Pulmonary:     Effort: Pulmonary effort is normal.     Breath sounds: Normal breath sounds.  Abdominal:     General: Abdomen is flat. Bowel sounds are increased.     Tenderness: There is no abdominal tenderness.  Musculoskeletal:     Cervical back: Normal range of motion.  Psychiatric:        Attention and Perception: Attention and perception normal.        Mood and Affect: Mood normal.        Behavior: Behavior is cooperative.        Thought Content: Thought content normal.        Cognition and Memory: Cognition normal.     UC Treatments / Results  Labs (all labs ordered are listed, but only abnormal results are displayed) Labs Reviewed  POCT URINALYSIS DIPSTICK, ED / UC  POC URINE PREG, ED    EKG   Radiology No results found.  Procedures Procedures (including critical care time)  Medications Ordered in UC Medications - No data to display  Initial Impression / Assessment and Plan / UC Course  I have reviewed the triage vital signs and the nursing notes.  Pertinent labs & imaging results that were available during my care of the patient were reviewed by me and considered in my medical decision making (see chart for details).    Abnormal uterine vaginal bleeding ongoing for approximately 5 to 6 weeks.  Patient has a  follow-up scheduled with a new primary care provider the first week of  March however was concerned that she has been having some fatigue with the intermittent prolonged periods and was concern for low hemoglobin.  She has no history of anemia and is not soaking multiple pads per day.  She reports on average when she is spotting may be changing pads twice daily. She has maintained her regular course of menses however following a week of resolution she is spotting with painless bleeding.  Advised to trial Naprosyn to help management of abnormal bleeding.  CBC and iron pending advised we will notify her if we need to prescribe any therapy based on lab findings.  Collected a STD cytology panel this is also pending.  Patient is not having any STD related symptoms such as vaginitis or vaginal pain.  Urine pregnancy negative. Final Clinical Impressions(s) / UC Diagnoses   Final diagnoses:  Abnormal uterine and vaginal bleeding, unspecified     Discharge Instructions     Your results will be updated to MyChart.  If any additional treatment is warranted you will be notified by member of our clinical team.  Start naproxen 375 twice daily this should help alleviate symptoms of bleeding.  Your urine analysis was normal and urine pregnancy is negative.  Your STD screening is pending and will result within 3 to 5 days.  We will notify you if any treatment is warranted. Keep follow-up scheduled with primary care provider.    ED Prescriptions    Medication Sig Dispense Auth. Provider   naproxen (NAPROSYN) 375 MG tablet Take 1 tablet (375 mg total) by mouth 2 (two) times daily. 20 tablet Bing Neighbors, FNP     PDMP not reviewed this encounter.   Bing Neighbors, FNP 03/27/20 1904

## 2020-03-28 ENCOUNTER — Ambulatory Visit: Payer: Self-pay

## 2020-03-30 ENCOUNTER — Other Ambulatory Visit: Payer: Self-pay

## 2020-03-30 DIAGNOSIS — Z20822 Contact with and (suspected) exposure to covid-19: Secondary | ICD-10-CM

## 2020-03-30 LAB — CERVICOVAGINAL ANCILLARY ONLY
Bacterial Vaginitis (gardnerella): POSITIVE — AB
Candida Glabrata: NEGATIVE
Candida Vaginitis: POSITIVE — AB
Chlamydia: NEGATIVE
Comment: NEGATIVE
Comment: NEGATIVE
Comment: NEGATIVE
Comment: NEGATIVE
Comment: NEGATIVE
Comment: NORMAL
Neisseria Gonorrhea: NEGATIVE
Trichomonas: NEGATIVE

## 2020-03-31 ENCOUNTER — Telehealth (HOSPITAL_COMMUNITY): Payer: Self-pay | Admitting: Emergency Medicine

## 2020-03-31 LAB — SARS-COV-2, NAA 2 DAY TAT

## 2020-03-31 LAB — NOVEL CORONAVIRUS, NAA: SARS-CoV-2, NAA: NOT DETECTED

## 2020-03-31 MED ORDER — FLUCONAZOLE 150 MG PO TABS
150.0000 mg | ORAL_TABLET | Freq: Once | ORAL | 0 refills | Status: AC
Start: 2020-03-31 — End: 2020-03-31

## 2020-03-31 MED ORDER — METRONIDAZOLE 500 MG PO TABS: 500.0000 mg | ORAL_TABLET | Freq: Two times a day (BID) | ORAL | 0 refills | Status: DC

## 2020-05-22 ENCOUNTER — Ambulatory Visit: Payer: Self-pay | Admitting: Internal Medicine

## 2020-06-25 ENCOUNTER — Encounter: Payer: Self-pay | Admitting: Internal Medicine

## 2020-06-25 ENCOUNTER — Ambulatory Visit
Admission: EM | Admit: 2020-06-25 | Discharge: 2020-06-25 | Disposition: A | Discharge status: Home / Self Care | Payer: Self-pay | Attending: Emergency Medicine | Admitting: Emergency Medicine

## 2020-06-25 ENCOUNTER — Other Ambulatory Visit: Payer: Self-pay

## 2020-06-25 DIAGNOSIS — Z20822 Contact with and (suspected) exposure to covid-19: Secondary | ICD-10-CM

## 2020-06-25 DIAGNOSIS — J069 Acute upper respiratory infection, unspecified: Secondary | ICD-10-CM

## 2020-06-25 HISTORY — DX: Allergic rhinitis due to pollen: J30.1

## 2020-06-25 MED ORDER — FLUTICASONE PROPIONATE 50 MCG/ACT NA SUSP: 1.0000 | Freq: Every day | NASAL | 0 refills | Status: DC

## 2020-06-25 MED ORDER — IBUPROFEN 800 MG PO TABS
800.0000 mg | ORAL_TABLET | Freq: Three times a day (TID) | ORAL | 0 refills | Status: DC
Start: 2020-06-25 — End: 2021-04-03

## 2020-06-25 MED ORDER — CETIRIZINE HCL 10 MG PO CAPS
10.0000 mg | ORAL_CAPSULE | Freq: Every day | ORAL | 0 refills | Status: DC
Start: 2020-06-25 — End: 2021-05-12

## 2020-06-25 NOTE — ED Triage Notes (Signed)
Pt c/o facial pain, congestion, runny nose, fever tmax 102 since yesterday.

## 2020-06-25 NOTE — Discharge Instructions (Signed)
COVID test pending Daily cetirizine and Flonase for congestion and drainage Ibuprofen and Tylenol for headaches Rest and fluids Follow-up if not improving or worsening

## 2020-06-26 NOTE — ED Provider Notes (Signed)
[facility]    CSN: [id number] Arrival date & time: 06/25/20  1929      History   Chief Complaint Chief Complaint  Patient presents with  . Nasal Congestion  . Fever  . Facial Pain    HPI Lisa Charles is a 35 y.o. female presenting today for evaluation of URI symptoms.  Reports associated facial pain sinus congestion rhinorrhea beginning yesterday, but symptoms worsened today.  Fevers up to 102.  Denies any close sick contacts.  HPI  Past Medical History:  Diagnosis Date  . Bronchitis   . Hay fever   . Pre-eclampsia     Patient Active Problem List   Diagnosis Date Noted  . Chronic hypertension during pregnancy, antepartum 01/16/2017  . Gestational hypertension 01/16/2017    Past Surgical History:  Procedure Laterality Date  . NO PAST SURGERIES      OB History    Gravida  2   Para  2   Term  2   Preterm  0   AB  0   Living  2     SAB  0   IAB  0   Ectopic  0   Multiple  0   Live Births  2            Home Medications    Prior to Admission medications   Medication Sig Start Date End Date Taking? Authorizing Provider  benzonatate (TESSALON PERLES) 100 MG capsule Take 1 capsule (100 mg total) by mouth 3 (three) times daily as needed. 03/25/20  Yes Hawks, Christy A, FNP  Cetirizine HCl 10 MG CAPS Take 1 capsule (10 mg total) by mouth daily for 10 days. 06/25/20 07/05/20 Yes Arli Bree C, PA-C  fluticasone (FLONASE) 50 MCG/ACT nasal spray Place 1-2 sprays into both nostrils daily. 06/25/20  Yes Ariyon Mittleman C, PA-C  ibuprofen (ADVIL) 800 MG tablet Take 1 tablet (800 mg total) by mouth 3 (three) times daily. 06/25/20  Yes Jefferie Holston C, PA-C  hydrochlorothiazide (HYDRODIURIL) 25 MG tablet Take 1 tablet (25 mg total) by mouth daily. Patient not taking: Reported on 09/18/2018 01/25/17 09/19/18  Adam Phenix, MD  norethindrone (MICRONOR,CAMILA,ERRIN) 0.35 MG tablet Take 1 tablet (0.35 mg total) by mouth daily. Patient not  taking: Reported on 09/18/2018 02/22/17 09/19/18  Roe Coombs, CNM  omeprazole (PRILOSEC) 20 MG capsule Take 1 capsule (20 mg total) by mouth daily. Patient not taking: Reported on 09/18/2018 04/24/18 09/19/18  Roxy Horseman, PA-C    Family History Family History  Problem Relation Age of Onset  . Diabetes Mother   . Hypertension Mother   . Hyperlipidemia Mother   . Gout Father     Social History Social History   Tobacco Use  . Smoking status: Former Smoker    Quit date: 02/2016    Years since quitting: 4.3  . Smokeless tobacco: Former Clinical biochemist  . Vaping Use: Never used  Substance Use Topics  . Alcohol use: No  . Drug use: No     Allergies   Expectorant cough control [guaifenesin]   Review of Systems Review of Systems  Constitutional: Positive for fever. Negative for activity change, appetite change, chills and fatigue.  HENT: Positive for congestion, rhinorrhea, sinus pressure and sore throat. Negative for ear pain and trouble swallowing.   Eyes: Negative for discharge and redness.  Respiratory: Positive for cough. Negative for chest tightness and shortness of breath.   Cardiovascular: Negative for chest pain.  Gastrointestinal: Negative for abdominal pain, diarrhea, nausea and vomiting.  Musculoskeletal: Negative for myalgias.  Skin: Negative for rash.  Neurological: Negative for dizziness, light-headedness and headaches.     Physical Exam Triage Vital Signs ED Triage Vitals  Enc Vitals Group     BP 06/25/20 2021 137/82     Pulse Rate 06/25/20 2021 100     Resp 06/25/20 2021 18     Temp 06/25/20 2021 100 F (37.8 C)     Temp src --      SpO2 06/25/20 2021 97 %     Weight --      Height --      Head Circumference --      Peak Flow --      Pain Score 06/25/20 2018 8     Pain Loc --      Pain Edu? --      Excl. in GC? --    No data found.  Updated Vital Signs BP 137/82   Pulse 100   Temp 100 F (37.8 C)   Resp 18   LMP 05/26/2020    SpO2 97%   Visual Acuity Right Eye Distance:   Left Eye Distance:   Bilateral Distance:    Right Eye Near:   Left Eye Near:    Bilateral Near:     Physical Exam Vitals and nursing note reviewed.  Constitutional:      Appearance: She is well-developed.     Comments: No acute distress  HENT:     Head: Normocephalic and atraumatic.     Ears:     Comments: Bilateral ears without tenderness to palpation of external auricle, tragus and mastoid, EAC's without erythema or swelling, TM's with good bony landmarks and cone of light. Non erythematous.     Nose: Nose normal.     Mouth/Throat:     Comments: Oral mucosa pink and moist, no tonsillar enlargement or exudate. Posterior pharynx patent and nonerythematous, no uvula deviation or swelling. Normal phonation.  Eyes:     Conjunctiva/sclera: Conjunctivae normal.  Cardiovascular:     Rate and Rhythm: Normal rate and regular rhythm.  Pulmonary:     Effort: Pulmonary effort is normal. No respiratory distress.     Comments: Breathing comfortably at rest, CTABL, no wheezing, rales or other adventitious sounds auscultated  Abdominal:     General: There is no distension.  Musculoskeletal:        General: Normal range of motion.     Cervical back: Neck supple.  Skin:    General: Skin is warm and dry.  Neurological:     Mental Status: She is alert and oriented to person, place, and time.      UC Treatments / Results  Labs (all labs ordered are listed, but only abnormal results are displayed) Labs Reviewed  NOVEL CORONAVIRUS, NAA    EKG   Radiology No results found.  Procedures Procedures (including critical care time)  Medications Ordered in UC Medications - No data to display  Initial Impression / Assessment and Plan / UC Course  I have reviewed the triage vital signs and the nursing notes.  Pertinent labs & imaging results that were available during my care of the patient were reviewed by me and considered in my  medical decision making (see chart for details).     Viral URI with cough-COVID test pending, suspect viral etiology, exam reassuring, recommending symptomatic and supportive care rest and fluids with continued close monitoring.  Discussed strict  return precautions. Patient verbalized understanding and is agreeable with plan.  Final Clinical Impressions(s) / UC Diagnoses   Final diagnoses:  Encounter for laboratory testing for COVID-19 virus  Viral URI with cough     Discharge Instructions     COVID test pending Daily cetirizine and Flonase for congestion and drainage Ibuprofen and Tylenol for headaches Rest and fluids Follow-up if not improving or worsening   ED Prescriptions    Medication Sig Dispense Auth. Provider   fluticasone (FLONASE) 50 MCG/ACT nasal spray Place 1-2 sprays into both nostrils daily. 16 g Alizah Sills C, PA-C   ibuprofen (ADVIL) 800 MG tablet Take 1 tablet (800 mg total) by mouth 3 (three) times daily. 21 tablet Teasha Murrillo C, PA-C   Cetirizine HCl 10 MG CAPS Take 1 capsule (10 mg total) by mouth daily for 10 days. 10 capsule Kristyana Notte, [place] C, PA-C     PDMP not reviewed this encounter.   Lew Dawes, [place] 06/26/20 1221

## 2020-06-27 LAB — NOVEL CORONAVIRUS, NAA: SARS-CoV-2, NAA: NOT DETECTED

## 2020-06-27 LAB — SARS-COV-2, NAA 2 DAY TAT

## 2020-06-29 ENCOUNTER — Encounter: Payer: Self-pay | Admitting: Internal Medicine

## 2020-08-07 ENCOUNTER — Emergency Department (HOSPITAL_COMMUNITY)
Admission: EM | Admit: 2020-08-07 | Discharge: 2020-08-07 | Disposition: A | Discharge status: Home / Self Care | Payer: Self-pay | Attending: Emergency Medicine | Admitting: Emergency Medicine

## 2020-08-07 DIAGNOSIS — G43909 Migraine, unspecified, not intractable, without status migrainosus: Secondary | ICD-10-CM | POA: Insufficient documentation

## 2020-08-07 DIAGNOSIS — Z87891 Personal history of nicotine dependence: Secondary | ICD-10-CM | POA: Insufficient documentation

## 2020-08-07 LAB — CBC WITH DIFFERENTIAL/PLATELET
Abs Immature Granulocytes: 0.02 10*3/uL (ref 0.00–0.07)
Basophils Absolute: 0 10*3/uL (ref 0.0–0.1)
Basophils Relative: 0 %
Eosinophils Absolute: 0.1 10*3/uL (ref 0.0–0.5)
Eosinophils Relative: 2 %
HCT: 36.5 % (ref 36.0–46.0)
Hemoglobin: 11.1 g/dL — ABNORMAL LOW (ref 12.0–15.0)
Immature Granulocytes: 0 %
Lymphocytes Relative: 40 %
Lymphs Abs: 2.6 10*3/uL (ref 0.7–4.0)
MCH: 24.9 pg — ABNORMAL LOW (ref 26.0–34.0)
MCHC: 30.4 g/dL (ref 30.0–36.0)
MCV: 81.8 fL (ref 80.0–100.0)
Monocytes Absolute: 0.4 10*3/uL (ref 0.1–1.0)
Monocytes Relative: 6 %
Neutro Abs: 3.5 10*3/uL (ref 1.7–7.7)
Neutrophils Relative %: 52 %
Platelets: 330 10*3/uL (ref 150–400)
RBC: 4.46 MIL/uL (ref 3.87–5.11)
RDW: 16.4 % — ABNORMAL HIGH (ref 11.5–15.5)
WBC: 6.7 10*3/uL (ref 4.0–10.5)
nRBC: 0 % (ref 0.0–0.2)

## 2020-08-07 LAB — BASIC METABOLIC PANEL
Anion gap: 6 (ref 5–15)
BUN: 12 mg/dL (ref 6–20)
CO2: 26 mmol/L (ref 22–32)
Calcium: 9 mg/dL (ref 8.9–10.3)
Chloride: 105 mmol/L (ref 98–111)
Creatinine, Ser: 0.56 mg/dL (ref 0.44–1.00)
GFR, Estimated: >60 mL/min (ref >60)
Glucose, Bld: 104 mg/dL — ABNORMAL HIGH (ref 70–99)
Potassium: 3.8 mmol/L (ref 3.5–5.1)
Sodium: 137 mmol/L (ref 135–145)

## 2020-08-07 MED ORDER — DIPHENHYDRAMINE HCL 50 MG/ML IJ SOLN
12.5000 mg | Freq: Once | INTRAMUSCULAR | Status: AC
  Administered 2020-08-07: 12.5 mg via INTRAVENOUS
  Filled 2020-08-07: qty 1

## 2020-08-07 MED ORDER — SODIUM CHLORIDE 0.9 % IV BOLUS
1000.0000 mL | Freq: Once | INTRAVENOUS | Status: AC
  Administered 2020-08-07: 1000 mL via INTRAVENOUS

## 2020-08-07 MED ORDER — KETOROLAC TROMETHAMINE 30 MG/ML IJ SOLN
30.0000 mg | Freq: Once | INTRAMUSCULAR | Status: AC
  Administered 2020-08-07: 30 mg via INTRAVENOUS
  Filled 2020-08-07: qty 1

## 2020-08-07 MED ORDER — SUMATRIPTAN SUCCINATE 50 MG PO TABS: 50.0000 mg | ORAL_TABLET | Freq: Once | ORAL | 0 refills | Status: DC | PRN

## 2020-08-07 MED ORDER — METOCLOPRAMIDE HCL 5 MG/ML IJ SOLN
10.0000 mg | Freq: Once | INTRAMUSCULAR | Status: AC
  Administered 2020-08-07: 10 mg via INTRAVENOUS
  Filled 2020-08-07: qty 2

## 2020-08-07 NOTE — Discharge Instructions (Signed)
Return if any problems.

## 2020-08-07 NOTE — ED Provider Notes (Signed)
[facility]-EMERGENCY DEPT Provider Note   CSN: [id number] Arrival date & time: 08/07/20  0725     History Chief Complaint  Patient presents with  . Headache    Lisa Charles is a 35 y.o. female.  The history is provided by the patient. No language interpreter was used.  Headache Pain location:  Generalized Quality:  Stabbing Radiates to:  Does not radiate Timing:  Constant Progression:  Worsening      Past Medical History:  Diagnosis Date  . Bronchitis   . Hay fever   . Pre-eclampsia     Patient Active Problem List   Diagnosis Date Noted  . Chronic hypertension during pregnancy, antepartum 01/16/2017  . Gestational hypertension 01/16/2017    Past Surgical History:  Procedure Laterality Date  . NO PAST SURGERIES       OB History    Gravida  2   Para  2   Term  2   Preterm  0   AB  0   Living  2     SAB  0   IAB  0   Ectopic  0   Multiple  0   Live Births  2           Family History  Problem Relation Age of Onset  . Diabetes Mother   . Hypertension Mother   . Hyperlipidemia Mother   . Gout Father     Social History   Tobacco Use  . Smoking status: Former Smoker    Quit date: 02/2016    Years since quitting: 4.5  . Smokeless tobacco: Former Clinical biochemist  . Vaping Use: Never used  Substance Use Topics  . Alcohol use: No  . Drug use: No    Home Medications Prior to Admission medications   Medication Sig Start Date End Date Taking? Authorizing Provider  SUMAtriptan (IMITREX) 50 MG tablet Take 1 tablet (50 mg total) by mouth once as needed for migraine. May repeat in 2 hours if headache persists or recurs. 08/07/20 08/08/21 Yes Cheron Schaumann K, PA-C  benzonatate (TESSALON PERLES) 100 MG capsule Take 1 capsule (100 mg total) by mouth 3 (three) times daily as needed. 03/25/20   Jannifer Rodney A, FNP  Cetirizine HCl 10 MG CAPS Take 1 capsule (10 mg total) by mouth daily for 10 days. 06/25/20 07/05/20  Wieters,  Hallie C, PA-C  fluticasone (FLONASE) 50 MCG/ACT nasal spray Place 1-2 sprays into both nostrils daily. 06/25/20   Wieters, Hallie C, PA-C  ibuprofen (ADVIL) 800 MG tablet Take 1 tablet (800 mg total) by mouth 3 (three) times daily. 06/25/20   Wieters, Hallie C, PA-C  hydrochlorothiazide (HYDRODIURIL) 25 MG tablet Take 1 tablet (25 mg total) by mouth daily. Patient not taking: Reported on 09/18/2018 01/25/17 09/19/18  Adam Phenix, MD  norethindrone (MICRONOR,CAMILA,ERRIN) 0.35 MG tablet Take 1 tablet (0.35 mg total) by mouth daily. Patient not taking: Reported on 09/18/2018 02/22/17 09/19/18  Roe Coombs, CNM  omeprazole (PRILOSEC) 20 MG capsule Take 1 capsule (20 mg total) by mouth daily. Patient not taking: Reported on 09/18/2018 04/24/18 09/19/18  Roxy Horseman, PA-C    Allergies    Expectorant cough control [guaifenesin]  Review of Systems   Review of Systems  Neurological: Positive for headaches.  All other systems reviewed and are negative.   Physical Exam Updated Vital Signs BP (!) 143/88   Pulse (!) 59   Temp 97.7 F (36.5 C) (Oral)  Resp 18   Ht 5\' 10"  (1.778 m)   Wt (!) 149.7 kg   SpO2 99%   BMI 47.35 kg/m   Physical Exam Vitals and nursing note reviewed.  Constitutional:      Appearance: She is well-developed.  HENT:     Head: Normocephalic.     Mouth/Throat:     Mouth: Mucous membranes are moist.  Eyes:     Extraocular Movements: Extraocular movements intact.  Cardiovascular:     Rate and Rhythm: Normal rate and regular rhythm.  Pulmonary:     Effort: Pulmonary effort is normal.  Abdominal:     General: There is no distension.  Musculoskeletal:        General: Normal range of motion.     Cervical back: Normal range of motion.  Skin:    General: Skin is warm.  Neurological:     Mental Status: She is alert and oriented to person, place, and time.  Psychiatric:        Mood and Affect: Mood normal.     ED Results / Procedures / Treatments    Labs (all labs ordered are listed, but only abnormal results are displayed) Labs Reviewed  CBC WITH DIFFERENTIAL/PLATELET - Abnormal; Notable for the following components:      Result Value   Hemoglobin 11.1 (*)    MCH 24.9 (*)    RDW 16.4 (*)    All other components within normal limits  BASIC METABOLIC PANEL - Abnormal; Notable for the following components:   Glucose, Bld 104 (*)    All other components within normal limits    EKG None  Radiology No results found.  Procedures Procedures   Medications Ordered in ED Medications  sodium chloride 0.9 % bolus 1,000 mL (1,000 mLs Intravenous New Bag/Given 08/07/20 0830)  metoCLOPramide (REGLAN) injection 10 mg (10 mg Intravenous Given 08/07/20 0831)  diphenhydrAMINE (BENADRYL) injection 12.5 mg (12.5 mg Intravenous Given 08/07/20 0830)  ketorolac (TORADOL) 30 MG/ML injection 30 mg (30 mg Intravenous Given 08/07/20 0831)    ED Course  I have reviewed the triage vital signs and the nursing notes.  Pertinent labs & imaging results that were available during my care of the patient were reviewed by me and considered in my medical decision making (see chart for details).    MDM Rules/Calculators/A&P                          MDM:  Pt given iv fluids, reglan, benadryl and toradol.  Pt reevaluated.  Pt reports she feels much better.  Headache resolved.  I will try imitrex to help with symptoms  Final Clinical Impression(s) / ED Diagnoses Final diagnoses:  Migraine without status migrainosus, not intractable, unspecified migraine type    Rx / DC Orders ED Discharge Orders         Ordered    SUMAtriptan (IMITREX) 50 MG tablet  Once PRN        08/07/20 1034        An After Visit Summary was printed and given to the patient.    10/07/20 08/07/20 1043    10/07/20, MD 08/08/20 1027

## 2020-08-07 NOTE — ED Triage Notes (Signed)
Pt states she has had a headache for the past month that comes and goes.

## 2020-09-14 ENCOUNTER — Ambulatory Visit: Payer: Self-pay | Admitting: Internal Medicine

## 2020-09-21 ENCOUNTER — Ambulatory Visit: Payer: Self-pay | Admitting: Internal Medicine

## 2021-02-19 ENCOUNTER — Encounter (HOSPITAL_COMMUNITY): Payer: Self-pay

## 2021-02-19 ENCOUNTER — Other Ambulatory Visit: Payer: Self-pay

## 2021-02-19 ENCOUNTER — Emergency Department (HOSPITAL_COMMUNITY)
Admission: EM | Admit: 2021-02-19 | Discharge: 2021-02-19 | Disposition: A | Discharge status: Home / Self Care | Payer: 59 | Attending: Emergency Medicine | Admitting: Emergency Medicine

## 2021-02-19 DIAGNOSIS — Z87891 Personal history of nicotine dependence: Secondary | ICD-10-CM | POA: Insufficient documentation

## 2021-02-19 DIAGNOSIS — R110 Nausea: Secondary | ICD-10-CM | POA: Diagnosis not present

## 2021-02-19 DIAGNOSIS — R8271 Bacteriuria: Secondary | ICD-10-CM | POA: Insufficient documentation

## 2021-02-19 DIAGNOSIS — R519 Headache, unspecified: Secondary | ICD-10-CM | POA: Diagnosis not present

## 2021-02-19 DIAGNOSIS — O2331 Infections of other parts of urinary tract in pregnancy, first trimester: Secondary | ICD-10-CM | POA: Insufficient documentation

## 2021-02-19 DIAGNOSIS — N9489 Other specified conditions associated with female genital organs and menstrual cycle: Secondary | ICD-10-CM | POA: Diagnosis not present

## 2021-02-19 DIAGNOSIS — Z3A00 Weeks of gestation of pregnancy not specified: Secondary | ICD-10-CM | POA: Diagnosis not present

## 2021-02-19 DIAGNOSIS — Z3490 Encounter for supervision of normal pregnancy, unspecified, unspecified trimester: Secondary | ICD-10-CM

## 2021-02-19 DIAGNOSIS — Z20822 Contact with and (suspected) exposure to covid-19: Secondary | ICD-10-CM | POA: Diagnosis not present

## 2021-02-19 DIAGNOSIS — O99891 Other specified diseases and conditions complicating pregnancy: Secondary | ICD-10-CM

## 2021-02-19 LAB — COMPREHENSIVE METABOLIC PANEL
ALT: 15 U/L (ref 0–44)
AST: 16 U/L (ref 15–41)
Albumin: 4.1 g/dL (ref 3.5–5.0)
Alkaline Phosphatase: 64 U/L (ref 38–126)
Anion gap: 7 (ref 5–15)
BUN: 10 mg/dL (ref 6–20)
CO2: 25 mmol/L (ref 22–32)
Calcium: 9.2 mg/dL (ref 8.9–10.3)
Chloride: 101 mmol/L (ref 98–111)
Creatinine, Ser: 0.58 mg/dL (ref 0.44–1.00)
GFR, Estimated: >60 mL/min (ref >60)
Glucose, Bld: 92 mg/dL (ref 70–99)
Potassium: 3.6 mmol/L (ref 3.5–5.1)
Sodium: 133 mmol/L — ABNORMAL LOW (ref 135–145)
Total Bilirubin: 0.6 mg/dL (ref 0.3–1.2)
Total Protein: 8.5 g/dL — ABNORMAL HIGH (ref 6.5–8.1)

## 2021-02-19 LAB — URINALYSIS, ROUTINE W REFLEX MICROSCOPIC
Bilirubin Urine: NEGATIVE
Glucose, UA: NEGATIVE mg/dL
Hgb urine dipstick: NEGATIVE
Ketones, ur: NEGATIVE mg/dL
Nitrite: NEGATIVE
Protein, ur: NEGATIVE mg/dL
Specific Gravity, Urine: 1.02 (ref 1.005–1.030)
pH: 7 (ref 5.0–8.0)

## 2021-02-19 LAB — CBC WITH DIFFERENTIAL/PLATELET
Abs Immature Granulocytes: 0.03 10*3/uL (ref 0.00–0.07)
Basophils Absolute: 0 10*3/uL (ref 0.0–0.1)
Basophils Relative: 0 %
Eosinophils Absolute: 0.1 10*3/uL (ref 0.0–0.5)
Eosinophils Relative: 1 %
HCT: 38.7 % (ref 36.0–46.0)
Hemoglobin: 12.4 g/dL (ref 12.0–15.0)
Immature Granulocytes: 0 %
Lymphocytes Relative: 29 %
Lymphs Abs: 2.6 10*3/uL (ref 0.7–4.0)
MCH: 26.3 pg (ref 26.0–34.0)
MCHC: 32 g/dL (ref 30.0–36.0)
MCV: 82 fL (ref 80.0–100.0)
Monocytes Absolute: 0.5 10*3/uL (ref 0.1–1.0)
Monocytes Relative: 5 %
Neutro Abs: 5.8 10*3/uL (ref 1.7–7.7)
Neutrophils Relative %: 65 %
Platelets: 311 10*3/uL (ref 150–400)
RBC: 4.72 MIL/uL (ref 3.87–5.11)
RDW: 17.7 % — ABNORMAL HIGH (ref 11.5–15.5)
WBC: 9 10*3/uL (ref 4.0–10.5)
nRBC: 0 % (ref 0.0–0.2)

## 2021-02-19 LAB — I-STAT BETA HCG BLOOD, ED (MC, WL, AP ONLY): I-stat hCG, quantitative: >2000 m[IU]/mL — ABNORMAL HIGH (ref <5)

## 2021-02-19 LAB — RESP PANEL BY RT-PCR (FLU A&B, COVID) ARPGX2
Influenza A by PCR: NEGATIVE
Influenza B by PCR: NEGATIVE
SARS Coronavirus 2 by RT PCR: NEGATIVE

## 2021-02-19 LAB — LIPASE, BLOOD: Lipase: 27 U/L (ref 11–51)

## 2021-02-19 LAB — HCG, QUANTITATIVE, PREGNANCY: hCG, Beta Chain, Quant, S: 56443 m[IU]/mL — ABNORMAL HIGH (ref <5)

## 2021-02-19 MED ORDER — SODIUM CHLORIDE 0.9 % IV BOLUS
1000.0000 mL | Freq: Once | INTRAVENOUS | Status: AC
  Administered 2021-02-19: 1000 mL via INTRAVENOUS

## 2021-02-19 MED ORDER — DOXYLAMINE-PYRIDOXINE 10-10 MG PO TBEC: 1.0000 | DELAYED_RELEASE_TABLET | Freq: Three times a day (TID) | ORAL | 0 refills | Status: DC | PRN

## 2021-02-19 MED ORDER — CEPHALEXIN 500 MG PO CAPS: 500.0000 mg | ORAL_CAPSULE | Freq: Two times a day (BID) | ORAL | 0 refills | Status: AC

## 2021-02-19 NOTE — ED Triage Notes (Signed)
Patient c/o intermittent lower abdominal pain, nausea, and a headache x 2 weeks

## 2021-02-19 NOTE — Discharge Instructions (Addendum)
Lifestyle/Dietary modifications: ? Avoiding an empty stomach ? Eating small frequent meals ? eating crackers prior to getting out of bed ? Eating a high protein snack before bed ? Avoiding spicy or rich foods ? Avoiding smells that are nausea inducing  If the Diclegis is too expensive - You may take vitamin B6 10 mg along with doxylamine/Unisom 10 mg 3-4 times daily.  The unisome make you sleepy so would recommend taking this at nighttime  Also taken least 1 g daily of ginger and multiple divided doses.  Follow up with OBGYN

## 2021-02-19 NOTE — ED Provider Notes (Signed)
[facility]-EMERGENCY DEPT Provider Note   CSN: [id number] Arrival date & time: 02/19/21  0736     History Chief Complaint  Patient presents with   Abdominal Pain   Nausea   Headache    Lisa Charles is a 35 y.o. female.  This is a 35 y.o. female G2, P2 with significant medical history as below, including bronchitis who presents to the ED with complaint of nausea.  Patient ports intermittent nausea over the past 2 weeks.  No vomiting.  Mild abdominal cramping to her lower quadrants.  No changes urination or bowel function.  No abnormal vaginal bleeding or discharge.  No concern for STI exposure.  No fevers or chills.  No recent suspicious oral intake, diet or medication changes.  Patient ports similar symptoms in the past when she was pregnant.  Her last menstrual period was approximately 2 months ago but was less than her typical menses, spotty.  No medications prior to arrival.  Symptoms mildly worsened with oral intake.  Last oral intake was yesterday evening with hotdogs.  No pain to the upper part of her abdomen.  No prior abdominal surgeries reported  The history is provided by the patient. No language interpreter was used.  Abdominal Pain Associated symptoms: nausea   Associated symptoms: no chest pain, no chills, no cough, no dysuria, no fever, no hematuria, no shortness of breath and no vomiting   Headache Associated symptoms: abdominal pain and nausea   Associated symptoms: no cough, no dizziness, no fever, no numbness, no photophobia and no vomiting       Past Medical History:  Diagnosis Date   Bronchitis    Hay fever    Pre-eclampsia     Patient Active Problem List   Diagnosis Date Noted   Chronic hypertension during pregnancy, antepartum 01/16/2017   Gestational hypertension 01/16/2017    Past Surgical History:  Procedure Laterality Date   NO PAST SURGERIES       OB History     Gravida  2   Para  2   Term  2   Preterm  0    AB  0   Living  2      SAB  0   IAB  0   Ectopic  0   Multiple  0   Live Births  2           Family History  Problem Relation Age of Onset   Diabetes Mother    Hypertension Mother    Hyperlipidemia Mother    Gout Father     Social History   Tobacco Use   Smoking status: Former    Types: Cigarettes    Quit date: 02/2016    Years since quitting: 5.0   Smokeless tobacco: Former  Building services engineer Use: Never used  Substance Use Topics   Alcohol use: No   Drug use: No    Home Medications Prior to Admission medications   Medication Sig Start Date End Date Taking? Authorizing Provider  cephALEXin (KEFLEX) 500 MG capsule Take 1 capsule (500 mg total) by mouth 2 (two) times daily for 7 days. 02/19/21 02/26/21 Yes Tanda Rockers A, DO  Doxylamine-Pyridoxine 10-10 MG TBEC Take 1 tablet by mouth 3 (three) times daily as needed. 02/19/21  Yes Tanda Rockers A, DO  benzonatate (TESSALON PERLES) 100 MG capsule Take 1 capsule (100 mg total) by mouth 3 (three) times daily as needed. 03/25/20   Junie Spencer,  FNP  Cetirizine HCl 10 MG CAPS Take 1 capsule (10 mg total) by mouth daily for 10 days. 06/25/20 07/05/20  Wieters, Hallie C, PA-C  fluticasone (FLONASE) 50 MCG/ACT nasal spray Place 1-2 sprays into both nostrils daily. 06/25/20   Wieters, Hallie C, PA-C  ibuprofen (ADVIL) 800 MG tablet Take 1 tablet (800 mg total) by mouth 3 (three) times daily. 06/25/20   Wieters, Hallie C, PA-C  SUMAtriptan (IMITREX) 50 MG tablet Take 1 tablet (50 mg total) by mouth once as needed for migraine. May repeat in 2 hours if headache persists or recurs. 08/07/20 08/08/21  Fransico Meadow, PA-C  hydrochlorothiazide (HYDRODIURIL) 25 MG tablet Take 1 tablet (25 mg total) by mouth daily. Patient not taking: Reported on 09/18/2018 01/25/17 09/19/18  Woodroe Mode, MD  norethindrone (MICRONOR,CAMILA,ERRIN) 0.35 MG tablet Take 1 tablet (0.35 mg total) by mouth daily. Patient not taking: Reported on 09/18/2018  02/22/17 09/19/18  Morene Crocker, CNM  omeprazole (PRILOSEC) 20 MG capsule Take 1 capsule (20 mg total) by mouth daily. Patient not taking: Reported on 09/18/2018 04/24/18 09/19/18  Montine Circle, PA-C    Allergies    Expectorant cough control [guaifenesin]  Review of Systems   Review of Systems  Constitutional:  Negative for chills and fever.  HENT:  Negative for facial swelling and trouble swallowing.   Eyes:  Negative for photophobia and visual disturbance.  Respiratory:  Negative for cough and shortness of breath.   Cardiovascular:  Negative for chest pain and palpitations.  Gastrointestinal:  Positive for abdominal pain and nausea. Negative for anal bleeding and vomiting.  Endocrine: Negative for polydipsia and polyuria.  Genitourinary:  Negative for difficulty urinating, dysuria, flank pain and hematuria.  Musculoskeletal:  Negative for gait problem and joint swelling.  Skin:  Negative for pallor and rash.  Neurological:  Negative for dizziness, syncope and numbness.  Psychiatric/Behavioral:  Negative for agitation and confusion.    Physical Exam Updated Vital Signs BP (!) 144/69    Pulse 70    Temp 97.7 F (36.5 C) (Oral)    Resp 18    Ht 5\' 10"  (1.778 m)    Wt (!) 145.2 kg    LMP 12/30/2020 (Approximate)    SpO2 100%    BMI 45.92 kg/m   Physical Exam Vitals and nursing note reviewed.  Constitutional:      General: She is not in acute distress.    Appearance: Normal appearance. She is well-developed. She is obese.  HENT:     Head: Normocephalic and atraumatic.     Right Ear: External ear normal.     Left Ear: External ear normal.     Nose: Nose normal.     Mouth/Throat:     Mouth: Mucous membranes are moist.  Eyes:     General: No scleral icterus.       Right eye: No discharge.        Left eye: No discharge.  Cardiovascular:     Rate and Rhythm: Normal rate and regular rhythm.     Pulses: Normal pulses.     Heart sounds: Normal heart sounds.  Pulmonary:      Effort: Pulmonary effort is normal. No respiratory distress.     Breath sounds: Normal breath sounds.  Abdominal:     General: Abdomen is flat.     Tenderness: There is no abdominal tenderness.     Comments: Minimal discomfort to the lower quadrants, no discrete tenderness upon palpation.  Musculoskeletal:  General: Normal range of motion.     Cervical back: Normal range of motion.     Right lower leg: No edema.     Left lower leg: No edema.  Skin:    General: Skin is warm and dry.     Capillary Refill: Capillary refill takes less than 2 seconds.  Neurological:     Mental Status: She is alert.  Psychiatric:        Mood and Affect: Mood normal.        Behavior: Behavior normal.    ED Results / Procedures / Treatments   Labs (all labs ordered are listed, but only abnormal results are displayed) Labs Reviewed  COMPREHENSIVE METABOLIC PANEL - Abnormal; Notable for the following components:      Result Value   Sodium 133 (*)    Total Protein 8.5 (*)    All other components within normal limits  URINALYSIS, ROUTINE W REFLEX MICROSCOPIC - Abnormal; Notable for the following components:   Leukocytes,Ua TRACE (*)    Bacteria, UA RARE (*)    All other components within normal limits  CBC WITH DIFFERENTIAL/PLATELET - Abnormal; Notable for the following components:   RDW 17.7 (*)    All other components within normal limits  HCG, QUANTITATIVE, PREGNANCY - Abnormal; Notable for the following components:   hCG, Beta Chain, Quant, S [id number] (*)    All other components within normal limits  I-STAT BETA HCG BLOOD, ED ([facility], [facility], [facility] ONLY) - Abnormal; Notable for the following components:   I-stat hCG, quantitative >2,000.0 (*)    All other components within normal limits  RESP PANEL BY RT-PCR (FLU A&B, COVID) ARPGX2  URINE CULTURE  LIPASE, BLOOD    EKG None  Radiology No results found.  Procedures Procedures   Medications Ordered in ED Medications  sodium chloride 0.9 %  bolus 1,000 mL (1,000 mLs Intravenous New Bag/Given 02/19/21 4332)    ED Course  I have reviewed the triage vital signs and the nursing notes.  Pertinent labs & imaging results that were available during my care of the patient were reviewed by me and considered in my medical decision making (see chart for details).    MDM Rules/Calculators/A&P                          CC: Abdominal pain, nausea  This patient complains of above; this involves an extensive number of treatment options and is a complaint that carries with it a high risk of complications and morbidity. Vital signs were reviewed. Serious etiologies considered.  No vaginal bleeding  Record review:  Previous records obtained and reviewed   Work up as above, notable for:  Labs & imaging results that were available during my care of the patient were reviewed by me and considered in my medical decision making.   Management: Given IVF  Reassessment:  Patient is pregnant, likely around 5 weeks based on last menstrual period and beta-hCG.   Discussed dietary and lifestyle changes to help with nausea vomiting.  Diclegis.  Close follow-up with OB/GYN.  Strict return precautions were discussed  Patient with asymptomatic bacteriuria, urine culture sent.  Start Keflex.  No ketonuria  Abdomen soft, nontender, nonperitoneal.  There is no vaginal bleeding reported. She is A+ blood type  Presentation today likely secondary to morning sickness, nausea and vomiting pregnancy.  She is tolerant p.o. and stable for discharge.  The patient improved significantly and was discharged in stable  condition. Detailed discussions were had with the patient regarding current findings, and need for close f/u with PCP or on call doctor. The patient has been instructed to return immediately if the symptoms worsen in any way for re-evaluation. Patient verbalized understanding and is in agreement with current care plan. All questions answered prior to  discharge.        This chart was dictated using voice recognition software.  Despite best efforts to proofread,  errors can occur which can change the documentation meaning.    Final Clinical Impression(s) / ED Diagnoses Final diagnoses:  Nausea  Pregnancy, unspecified gestational age  Asymptomatic bacteriuria during pregnancy    Rx / DC Orders ED Discharge Orders          Ordered    Doxylamine-Pyridoxine 10-10 MG TBEC  3 times daily PRN        02/19/21 1111    cephALEXin (KEFLEX) 500 MG capsule  2 times daily        02/19/21 1112             Jeanell Sparrow, DO 02/19/21 1115

## 2021-02-20 LAB — URINE CULTURE: Culture: <10000 — AB

## 2021-04-03 ENCOUNTER — Encounter (HOSPITAL_COMMUNITY): Payer: Self-pay | Admitting: Obstetrics and Gynecology

## 2021-04-03 ENCOUNTER — Other Ambulatory Visit: Payer: Self-pay

## 2021-04-03 ENCOUNTER — Inpatient Hospital Stay (HOSPITAL_COMMUNITY)
Admission: AD | Admit: 2021-04-03 | Discharge: 2021-04-03 | Disposition: A | Discharge status: Home / Self Care | Payer: 59 | Attending: Obstetrics and Gynecology | Admitting: Obstetrics and Gynecology

## 2021-04-03 DIAGNOSIS — Z87891 Personal history of nicotine dependence: Secondary | ICD-10-CM | POA: Diagnosis not present

## 2021-04-03 DIAGNOSIS — R109 Unspecified abdominal pain: Secondary | ICD-10-CM | POA: Insufficient documentation

## 2021-04-03 DIAGNOSIS — O09292 Supervision of pregnancy with other poor reproductive or obstetric history, second trimester: Secondary | ICD-10-CM | POA: Insufficient documentation

## 2021-04-03 DIAGNOSIS — Z3A16 16 weeks gestation of pregnancy: Secondary | ICD-10-CM | POA: Insufficient documentation

## 2021-04-03 DIAGNOSIS — R519 Headache, unspecified: Secondary | ICD-10-CM | POA: Insufficient documentation

## 2021-04-03 DIAGNOSIS — O26892 Other specified pregnancy related conditions, second trimester: Secondary | ICD-10-CM | POA: Diagnosis present

## 2021-04-03 LAB — URINALYSIS, ROUTINE W REFLEX MICROSCOPIC
Bilirubin Urine: NEGATIVE
Glucose, UA: NEGATIVE mg/dL
Hgb urine dipstick: NEGATIVE
Ketones, ur: NEGATIVE mg/dL
Nitrite: NEGATIVE
Protein, ur: NEGATIVE mg/dL
Specific Gravity, Urine: 1.016 (ref 1.005–1.030)
pH: 8 (ref 5.0–8.0)

## 2021-04-03 MED ORDER — ACETAMINOPHEN 500 MG PO TABS
1000.0000 mg | ORAL_TABLET | Freq: Once | ORAL | Status: AC
  Administered 2021-04-03: 1000 mg via ORAL
  Filled 2021-04-03: qty 2

## 2021-04-03 MED ORDER — METOCLOPRAMIDE HCL 10 MG PO TABS
10.0000 mg | ORAL_TABLET | Freq: Once | ORAL | Status: AC
  Administered 2021-04-03: 10 mg via ORAL
  Filled 2021-04-03: qty 1

## 2021-04-03 MED ORDER — METOCLOPRAMIDE HCL 10 MG PO TABS: 10.0000 mg | ORAL_TABLET | Freq: Four times a day (QID) | ORAL | 0 refills | Status: DC

## 2021-04-03 NOTE — MAU Note (Signed)
Pt reports to mau with c/o a headache for the past 2-3 days.  Pt reports she has not taken any medication for pain.  Also reports some mild abd cramping.  Denies bleeding or LOF.

## 2021-04-03 NOTE — MAU Provider Note (Signed)
History     CSN: [id number]  Arrival date and time: 04/03/21 0705   Event Date/Time   First Provider Initiated Contact with Patient 04/03/21 [phone]      Chief Complaint  Patient presents with   Headache   Abdominal Pain   HPI Lisa Charles is a 36 y.o. G3P2002 at [redacted]w[redacted]d who presents with a headache. She reports this has been ongoing for 3-4 days. She rates the pain a 7/10 and has tried one tylenol and sleep with no relief. She was concerned this could be preeclampsia. She denies any abdominal pain, vaginal bleeding or discharge.   OB History     Gravida  3   Para  2   Term  2   Preterm  0   AB  0   Living  2      SAB  0   IAB  0   Ectopic  0   Multiple  0   Live Births  2           Past Medical History:  Diagnosis Date   Bronchitis    Hay fever    Pre-eclampsia     Past Surgical History:  Procedure Laterality Date   NO PAST SURGERIES      Family History  Problem Relation Age of Onset   Diabetes Mother    Hypertension Mother    Hyperlipidemia Mother    Gout Father     Social History   Tobacco Use   Smoking status: Former    Types: Cigarettes    Quit date: 02/2016    Years since quitting: 5.1   Smokeless tobacco: Former  Building services engineer Use: Never used  Substance Use Topics   Alcohol use: No   Drug use: No    Allergies:  Allergies  Allergen Reactions   Expectorant Cough Control [Guaifenesin] Other (See Comments)    Radiating pain around body and back. Pt states she can not take ay kind of expectorant.     Medications Prior to Admission  Medication Sig Dispense Refill Last Dose   benzonatate (TESSALON PERLES) 100 MG capsule Take 1 capsule (100 mg total) by mouth 3 (three) times daily as needed. 20 capsule 0    Cetirizine HCl 10 MG CAPS Take 1 capsule (10 mg total) by mouth daily for 10 days. 10 capsule 0    Doxylamine-Pyridoxine 10-10 MG TBEC Take 1 tablet by mouth 3 (three) times daily as needed. 60 tablet 0     fluticasone (FLONASE) 50 MCG/ACT nasal spray Place 1-2 sprays into both nostrils daily. 16 g 0    ibuprofen (ADVIL) 800 MG tablet Take 1 tablet (800 mg total) by mouth 3 (three) times daily. 21 tablet 0    SUMAtriptan (IMITREX) 50 MG tablet Take 1 tablet (50 mg total) by mouth once as needed for migraine. May repeat in 2 hours if headache persists or recurs. 20 tablet 0     Review of Systems  Constitutional: Negative.  Negative for fatigue and fever.  HENT: Negative.    Respiratory: Negative.  Negative for shortness of breath.   Cardiovascular: Negative.  Negative for chest pain.  Gastrointestinal: Negative.  Negative for abdominal pain, constipation, diarrhea, nausea and vomiting.  Genitourinary: Negative.  Negative for dysuria, vaginal bleeding and vaginal discharge.  Neurological:  Positive for headaches. Negative for dizziness.  Physical Exam   Blood pressure 120/73, pulse 79, temperature 97.9 F (36.6 C), temperature source Oral, resp. rate 17, last  menstrual period 12/07/2020, SpO2 98 %, currently breastfeeding.  Physical Exam Vitals and nursing note reviewed.  Constitutional:      General: She is not in acute distress.    Appearance: She is well-developed.  HENT:     Head: Normocephalic.  Eyes:     Pupils: Pupils are equal, round, and reactive to light.  Cardiovascular:     Rate and Rhythm: Normal rate and regular rhythm.     Heart sounds: Normal heart sounds.  Pulmonary:     Effort: Pulmonary effort is normal. No respiratory distress.     Breath sounds: Normal breath sounds.  Abdominal:     General: Bowel sounds are normal. There is no distension.     Palpations: Abdomen is soft.     Tenderness: There is no abdominal tenderness.  Skin:    General: Skin is warm and dry.  Neurological:     Mental Status: She is alert and oriented to person, place, and time.  Psychiatric:        Mood and Affect: Mood normal.        Behavior: Behavior normal.        Thought Content:  Thought content normal.        Judgment: Judgment normal.    MAU Course  Procedures Results for orders placed or performed during the hospital encounter of 04/03/21 (from the past 24 hour(s))  Urinalysis, Routine w reflex microscopic Urine, Clean Catch     Status: Abnormal   Collection Time: 04/03/21  7:31 AM  Result Value Ref Range   Color, Urine YELLOW YELLOW   APPearance HAZY (A) CLEAR   Specific Gravity, Urine 1.016 1.005 - 1.030   pH 8.0 5.0 - 8.0   Glucose, UA NEGATIVE NEGATIVE mg/dL   Hgb urine dipstick NEGATIVE NEGATIVE   Bilirubin Urine NEGATIVE NEGATIVE   Ketones, ur NEGATIVE NEGATIVE mg/dL   Protein, ur NEGATIVE NEGATIVE mg/dL   Nitrite NEGATIVE NEGATIVE   Leukocytes,Ua MODERATE (A) NEGATIVE   RBC / HPF 0-5 0 - 5 RBC/hpf   WBC, UA 0-5 0 - 5 WBC/hpf   Bacteria, UA RARE (A) NONE SEEN   Squamous Epithelial / LPF 21-50 0 - 5    MDM UA, UC Tylenol and Reglan PO- pain now a 2/10  Pt informed that the ultrasound is considered a limited OB ultrasound and is not intended to be a complete ultrasound exam.  Patient also informed that the ultrasound is not being completed with the intent of assessing for fetal or placental anomalies or any pelvic abnormalities.  Explained that the purpose of todays ultrasound is to assess for  viability.  Patient acknowledges the purpose of the exam and the limitations of the study.    Active fetus with FHR 134 bpm noted.  Assessment and Plan   1. Pregnancy headache in second trimester   2. [redacted] weeks gestation of pregnancy    -Discharge home in stable condition -Rx for reglan sent to patient's pharmacy -Second trimester precautions discussed -Patient advised to follow-up with OB as scheduled for prenatal care -Patient may return to MAU as needed or if her condition were to change or worsen   Rolm Bookbinder CNM 04/03/2021, 7:58 AM

## 2021-04-03 NOTE — Discharge Instructions (Signed)

## 2021-04-04 LAB — CULTURE, OB URINE: Culture: <10000 — AB

## 2021-04-06 ENCOUNTER — Telehealth: Payer: Self-pay | Admitting: *Deleted

## 2021-04-06 NOTE — Telephone Encounter (Signed)
[employer] faxed form for breast pump order. Form was signed and returned by fax today.

## 2021-04-14 ENCOUNTER — Encounter: Payer: Self-pay | Admitting: Obstetrics and Gynecology

## 2021-04-14 ENCOUNTER — Other Ambulatory Visit: Payer: Self-pay

## 2021-04-14 ENCOUNTER — Other Ambulatory Visit (HOSPITAL_COMMUNITY)
Admission: RE | Admit: 2021-04-14 | Discharge: 2021-04-14 | Disposition: A | Discharge status: Home / Self Care | Payer: 59 | Source: Ambulatory Visit | Attending: Obstetrics and Gynecology | Admitting: Obstetrics and Gynecology

## 2021-04-14 ENCOUNTER — Ambulatory Visit (INDEPENDENT_AMBULATORY_CARE_PROVIDER_SITE_OTHER): Payer: Medicaid Other | Admitting: Obstetrics and Gynecology

## 2021-04-14 VITALS — BP 133/75 | HR 80 | Wt 328.7 lb

## 2021-04-14 DIAGNOSIS — Z23 Encounter for immunization: Secondary | ICD-10-CM | POA: Diagnosis not present

## 2021-04-14 DIAGNOSIS — Z3482 Encounter for supervision of other normal pregnancy, second trimester: Secondary | ICD-10-CM

## 2021-04-14 DIAGNOSIS — Z3480 Encounter for supervision of other normal pregnancy, unspecified trimester: Secondary | ICD-10-CM | POA: Diagnosis present

## 2021-04-14 DIAGNOSIS — O09522 Supervision of elderly multigravida, second trimester: Secondary | ICD-10-CM

## 2021-04-14 DIAGNOSIS — O10912 Unspecified pre-existing hypertension complicating pregnancy, second trimester: Secondary | ICD-10-CM | POA: Diagnosis not present

## 2021-04-14 DIAGNOSIS — Z3A18 18 weeks gestation of pregnancy: Secondary | ICD-10-CM

## 2021-04-14 DIAGNOSIS — Z3009 Encounter for other general counseling and advice on contraception: Secondary | ICD-10-CM | POA: Insufficient documentation

## 2021-04-14 DIAGNOSIS — O10919 Unspecified pre-existing hypertension complicating pregnancy, unspecified trimester: Secondary | ICD-10-CM

## 2021-04-14 DIAGNOSIS — O09529 Supervision of elderly multigravida, unspecified trimester: Secondary | ICD-10-CM | POA: Insufficient documentation

## 2021-04-14 MED ORDER — ASPIRIN EC 81 MG PO TBEC: 81.0000 mg | DELAYED_RELEASE_TABLET | Freq: Every day | ORAL | 2 refills | Status: DC

## 2021-04-14 MED ORDER — BLOOD PRESSURE KIT DEVI: 1.0000 | 0 refills | Status: DC | PRN

## 2021-04-14 MED ORDER — GOJJI WEIGHT SCALE MISC: 1.0000 | 0 refills | Status: DC | PRN

## 2021-04-14 NOTE — Progress Notes (Signed)
NOB in office, reports fetal movement with some pressure.

## 2021-04-14 NOTE — Progress Notes (Signed)
Subjective:  Lisa Charles is a 36 y.o. G3P2002 at 76w2dbeing seen today for first OB visit. [facility]O [facility] x 2 without problems.  She is currently monitored for the following issues for this high-risk pregnancy and has Chronic hypertension during pregnancy, antepartum; Supervision of other normal pregnancy, antepartum; and AMA (advanced maternal age) multigravida 35+ on their problem list.  Patient reports no complaints.  Contractions: Not present. Vag. Bleeding: None.  Movement: Present. Denies leaking of fluid.   The following portions of the patient's history were reviewed and updated as appropriate: allergies, current medications, past family history, past medical history, past social history, past surgical history and problem list. Problem list updated.  Objective:   Vitals:   04/14/21 1510  BP: 133/75  Pulse: 80  Weight: (!) 328 lb 11.2 oz (149.1 kg)    Fetal Status: Fetal Heart Rate (bpm): 146   Movement: Present     General:  Alert, oriented and cooperative. Patient is in no acute distress.  Skin: Skin is warm and dry. No rash noted.   Cardiovascular: Normal heart rate noted  Respiratory: Normal respiratory effort, no problems with respiration noted  Abdomen: Soft, gravid, appropriate for gestational age. Pain/Pressure: Present     Pelvic:  Cervical exam performed        Extremities: Normal range of motion.  Edema: None  Mental Status: Normal mood and affect. Normal behavior. Normal judgment and thought content.   Urinalysis:      Assessment and Plan:  Pregnancy: G3P2002 at 173w2d1. Supervision of other normal pregnancy, antepartum Prenatal labs and care reviewed with pt. Genetic testing reviewed - Culture, OB Urine - Cytology - PAP( [place]) - CBC/D/Plt+RPR+Rh+ABO+RubIgG... - Genetic Screening - Cervicovaginal ancillary only( [place]) - Comp Met ([facility]ET) - Protein / creatinine ratio, urine - Hemoglobin A1c - [place] OB [place]  +14 WK; Future  2. Chronic hypertension during pregnancy, antepartum Reviewed with pt. No meds Serial growth scans and antenatal testing as per protocol - Comp Met ([facility]ET) - Protein / creatinine ratio, urine - [place] OB DET[place] +14 WK; Future  3. Multigravida of advanced maternal age in second trimester  - Genetic Screening - Hemoglobin A1c - [place] OB DETAIL +14 WK; Future   Unwanted fertility BTL papers at later visit Preterm labor symptoms and general obstetric precautions including but not limited to vaginal bleeding, contractions, leaking of fluid and fetal movement were reviewed in detail with the patient. Please refer to After Visit Summary for other counseling recommendations.  Return in about 4 weeks (around 05/12/2021) for OB visit, face to face, MD only.   ErChancy MilroyMD

## 2021-04-14 NOTE — Patient Instructions (Signed)

## 2021-04-14 NOTE — Addendum Note (Signed)
Addended by: Natale Milch D on: 04/14/2021 04:51 PM   Modules accepted: Orders

## 2021-04-15 LAB — COMPREHENSIVE METABOLIC PANEL
ALT: 27 IU/L (ref 0–32)
AST: 19 IU/L (ref 0–40)
Albumin/Globulin Ratio: 1.3 (ref 1.2–2.2)
Albumin: 4.4 g/dL (ref 3.8–4.8)
Alkaline Phosphatase: 90 IU/L (ref 44–121)
BUN/Creatinine Ratio: 15 (ref 9–23)
BUN: 8 mg/dL (ref 6–20)
Bilirubin Total: <0.2 mg/dL (ref 0.0–1.2)
CO2: 18 mmol/L — ABNORMAL LOW (ref 20–29)
Calcium: 10.1 mg/dL (ref 8.7–10.2)
Chloride: 106 mmol/L (ref 96–106)
Creatinine, Ser: 0.53 mg/dL — ABNORMAL LOW (ref 0.57–1.00)
Globulin, Total: 3.3 g/dL (ref 1.5–4.5)
Glucose: 93 mg/dL (ref 70–99)
Potassium: 4.1 mmol/L (ref 3.5–5.2)
Sodium: 139 mmol/L (ref 134–144)
Total Protein: 7.7 g/dL (ref 6.0–8.5)
eGFR: 124 mL/min/{1.73_m2} (ref >59)

## 2021-04-15 LAB — CERVICOVAGINAL ANCILLARY ONLY
Bacterial Vaginitis (gardnerella): POSITIVE — AB
Candida Glabrata: NEGATIVE
Candida Vaginitis: NEGATIVE
Chlamydia: NEGATIVE
Comment: NEGATIVE
Comment: NEGATIVE
Comment: NEGATIVE
Comment: NEGATIVE
Comment: NEGATIVE
Comment: NORMAL
Neisseria Gonorrhea: NEGATIVE
Trichomonas: NEGATIVE

## 2021-04-15 LAB — CBC/D/PLT+RPR+RH+ABO+RUBIGG...
Antibody Screen: NEGATIVE
Basophils Absolute: 0 10*3/uL (ref 0.0–0.2)
Basos: 0 %
EOS (ABSOLUTE): 0.1 10*3/uL (ref 0.0–0.4)
Eos: 1 %
HCV Ab: <0.1 s/co ratio (ref 0.0–0.9)
HIV Screen 4th Generation wRfx: NONREACTIVE
Hematocrit: 33.9 % — ABNORMAL LOW (ref 34.0–46.6)
Hemoglobin: 11.1 g/dL (ref 11.1–15.9)
Hepatitis B Surface Ag: NEGATIVE
Immature Grans (Abs): 0 10*3/uL (ref 0.0–0.1)
Immature Granulocytes: 0 %
Lymphocytes Absolute: 3.3 10*3/uL — ABNORMAL HIGH (ref 0.7–3.1)
Lymphs: 28 %
MCH: 25.9 pg — ABNORMAL LOW (ref 26.6–33.0)
MCHC: 32.7 g/dL (ref 31.5–35.7)
MCV: 79 fL (ref 79–97)
Monocytes Absolute: 0.7 10*3/uL (ref 0.1–0.9)
Monocytes: 6 %
Neutrophils Absolute: 7.7 10*3/uL — ABNORMAL HIGH (ref 1.4–7.0)
Neutrophils: 65 %
Platelets: 294 10*3/uL (ref 150–450)
RBC: 4.28 x10E6/uL (ref 3.77–5.28)
RDW: 16.4 % — ABNORMAL HIGH (ref 11.7–15.4)
RPR Ser Ql: NONREACTIVE
Rh Factor: POSITIVE
Rubella Antibodies, IGG: 7.31 index (ref >0.99)
WBC: 11.9 10*3/uL — ABNORMAL HIGH (ref 3.4–10.8)

## 2021-04-15 LAB — PROTEIN / CREATININE RATIO, URINE
Creatinine, Urine: 212.5 mg/dL
Protein, Ur: 14.6 mg/dL
Protein/Creat Ratio: 69 mg/g creat (ref 0–200)

## 2021-04-15 LAB — HEMOGLOBIN A1C
Est. average glucose Bld gHb Est-mCnc: 126 mg/dL
Hgb A1c MFr Bld: 6 % — ABNORMAL HIGH (ref 4.8–5.6)

## 2021-04-15 LAB — HCV INTERPRETATION

## 2021-04-16 ENCOUNTER — Other Ambulatory Visit: Payer: Self-pay

## 2021-04-16 LAB — AFP, SERUM, OPEN SPINA BIFIDA
AFP MoM: 0.83
AFP Value: 26.9 ng/mL
Gest. Age on Collection Date: 18 weeks
Maternal Age At EDD: 35.7 yr
OSBR Risk 1 IN: 10000
Test Results:: NEGATIVE
Weight: 328 [lb_av]

## 2021-04-16 LAB — CYTOLOGY - PAP
Comment: NEGATIVE
Diagnosis: NEGATIVE
High risk HPV: NEGATIVE

## 2021-04-16 LAB — CULTURE, OB URINE

## 2021-04-16 LAB — URINE CULTURE, OB REFLEX

## 2021-04-16 MED ORDER — METRONIDAZOLE 500 MG PO TABS: 500.0000 mg | ORAL_TABLET | Freq: Two times a day (BID) | ORAL | 0 refills | Status: DC

## 2021-04-26 ENCOUNTER — Encounter: Payer: Self-pay | Admitting: Obstetrics and Gynecology

## 2021-04-30 ENCOUNTER — Other Ambulatory Visit: Payer: Self-pay

## 2021-04-30 ENCOUNTER — Ambulatory Visit: Payer: Medicaid Other | Attending: Obstetrics and Gynecology

## 2021-04-30 ENCOUNTER — Ambulatory Visit: Payer: Medicaid Other | Admitting: *Deleted

## 2021-04-30 VITALS — BP 123/61 | HR 84

## 2021-04-30 DIAGNOSIS — Z3480 Encounter for supervision of other normal pregnancy, unspecified trimester: Secondary | ICD-10-CM | POA: Diagnosis not present

## 2021-04-30 DIAGNOSIS — O10912 Unspecified pre-existing hypertension complicating pregnancy, second trimester: Secondary | ICD-10-CM

## 2021-04-30 DIAGNOSIS — O99212 Obesity complicating pregnancy, second trimester: Secondary | ICD-10-CM | POA: Diagnosis not present

## 2021-04-30 DIAGNOSIS — O10012 Pre-existing essential hypertension complicating pregnancy, second trimester: Secondary | ICD-10-CM | POA: Diagnosis not present

## 2021-04-30 DIAGNOSIS — Z3A19 19 weeks gestation of pregnancy: Secondary | ICD-10-CM | POA: Insufficient documentation

## 2021-04-30 DIAGNOSIS — Z363 Encounter for antenatal screening for malformations: Secondary | ICD-10-CM | POA: Diagnosis present

## 2021-04-30 DIAGNOSIS — O10919 Unspecified pre-existing hypertension complicating pregnancy, unspecified trimester: Secondary | ICD-10-CM | POA: Diagnosis not present

## 2021-04-30 DIAGNOSIS — O09522 Supervision of elderly multigravida, second trimester: Secondary | ICD-10-CM | POA: Insufficient documentation

## 2021-05-03 ENCOUNTER — Other Ambulatory Visit: Payer: Self-pay | Admitting: *Deleted

## 2021-05-03 DIAGNOSIS — Z362 Encounter for other antenatal screening follow-up: Secondary | ICD-10-CM

## 2021-05-03 DIAGNOSIS — R638 Other symptoms and signs concerning food and fluid intake: Secondary | ICD-10-CM

## 2021-05-03 DIAGNOSIS — Z3689 Encounter for other specified antenatal screening: Secondary | ICD-10-CM

## 2021-05-12 ENCOUNTER — Other Ambulatory Visit: Payer: Self-pay

## 2021-05-12 ENCOUNTER — Ambulatory Visit (INDEPENDENT_AMBULATORY_CARE_PROVIDER_SITE_OTHER): Payer: Medicaid Other | Admitting: Obstetrics and Gynecology

## 2021-05-12 ENCOUNTER — Encounter: Payer: Medicaid Other | Admitting: Medical

## 2021-05-12 VITALS — BP 138/84 | HR 83 | Wt 331.8 lb

## 2021-05-12 DIAGNOSIS — Z3009 Encounter for other general counseling and advice on contraception: Secondary | ICD-10-CM

## 2021-05-12 DIAGNOSIS — Z3480 Encounter for supervision of other normal pregnancy, unspecified trimester: Secondary | ICD-10-CM

## 2021-05-12 DIAGNOSIS — O10919 Unspecified pre-existing hypertension complicating pregnancy, unspecified trimester: Secondary | ICD-10-CM

## 2021-05-12 NOTE — Progress Notes (Signed)
? ?  PRENATAL VISIT NOTE ? ?Subjective:  ?Lisa Charles is a 36 y.o. G3P2002 at [redacted]w[redacted]d being seen today for ongoing prenatal care.  She is currently monitored for the following issues for this high-risk pregnancy and has Chronic hypertension during pregnancy, antepartum; Supervision of other normal pregnancy, antepartum; AMA (advanced maternal age) multigravida 35+; and Unwanted fertility on their problem list. ? ?Patient reports no complaints.  Contractions: Not present. Vag. Bleeding: None.  Movement: Present. Denies leaking of fluid.  ? ?The following portions of the patient's history were reviewed and updated as appropriate: allergies, current medications, past family history, past medical history, past social history, past surgical history and problem list.  ? ?Objective:  ? ?Vitals:  ? 05/12/21 1318  ?BP: 138/84  ?Pulse: 83  ?Weight: (!) 331 lb 12.8 oz (150.5 kg)  ? ? ?Fetal Status: Fetal Heart Rate (bpm): 138   Movement: Present    ? ?General:  Alert, oriented and cooperative. Patient is in no acute distress.  ?Skin: Skin is warm and dry. No rash noted.   ?Cardiovascular: Normal heart rate noted  ?Respiratory: Normal respiratory effort, no problems with respiration noted  ?Abdomen: Soft, gravid, appropriate for gestational age.  Pain/Pressure: Absent     ?Pelvic: Cervical exam deferred        ?Extremities: Normal range of motion.  Edema: Trace  ?Mental Status: Normal mood and affect. Normal behavior. Normal judgment and thought content.  ? ?Assessment and Plan:  ?Pregnancy: [id number] at [redacted]w[redacted]d ?1. Supervision of other normal pregnancy, antepartum ? ?Doing well ?Feeling good and eating well, still has some food aversions.  ? ?2. Chronic hypertension during pregnancy, antepartum ? ?Continue BASA ?Reviewed CHTN and the diagnoses ?Reviewed s/s of Pre E ?Antenatal testing starts @ 32 weeks.  ? ?3. Unwanted fertility ? ?MD visit to sign consent.  ? ?Preterm labor symptoms and general obstetric precautions including but  not limited to vaginal bleeding, contractions, leaking of fluid and fetal movement were reviewed in detail with the patient. ?Please refer to After Visit Summary for other counseling recommendations.  ? ?Return in about 4 weeks (around 06/09/2021), or Please schedule with MD to sign tubal consent.. ? ?Future Appointments  ?Date Time Provider [place]  ?05/28/2021  3:30 PM [facility] NURSE [facility]  ?05/28/2021  3:45 PM [facility] US5 [facility]  ?06/10/2021  3:30 PM Constant, Vickii Chafe, MD [facility] None  ? ? ?Noni Saupe, NP  ?

## 2021-05-21 ENCOUNTER — Ambulatory Visit (INDEPENDENT_AMBULATORY_CARE_PROVIDER_SITE_OTHER): Payer: 59 | Admitting: Emergency Medicine

## 2021-05-21 ENCOUNTER — Other Ambulatory Visit: Payer: Self-pay

## 2021-05-21 DIAGNOSIS — O10919 Unspecified pre-existing hypertension complicating pregnancy, unspecified trimester: Secondary | ICD-10-CM

## 2021-05-21 MED ORDER — NIFEDIPINE ER OSMOTIC RELEASE 30 MG PO TB24: 30.0000 mg | ORAL_TABLET | Freq: Every day | ORAL | 2 refills | Status: DC

## 2021-05-21 NOTE — Progress Notes (Signed)
Pt presents for BP check today.  ? ?Pt sent MyChart message to clinic yesterday, stating that she had a series of high blood pressure readings at home. Also endorsed a headache yesterday that resolved on it's own. Denied vision changes.  ?Pt was instructed to hydrate, take tylenol and continue to monitor BP readings. ? ?Pt has CHTN, on BASA and is compliant. Today, has no c/o HA and blood pressure 133/74. Denies headache, vision changes, or chest pain at this time.  ? ?Reports appropriate fetal movement.  ? ?Per consult with Dr. Debroah Loop, Procardia 30 XL has been ordered, and sent to patient's pharmacy.  ? ?Patient counseled to take 1 a day, continue to monitor BP at home. Patient confirms understanding. Will return for f/u next week. ? ? ? ? ?

## 2021-05-25 NOTE — Progress Notes (Signed)
Patient was assessed and managed by nursing staff during this encounter. I have reviewed the chart and agree with the documentation and plan. I have also made any necessary editorial changes. ? ?Emeterio Reeve, MD ?05/25/2021 1:21 PM  ? ?

## 2021-05-28 ENCOUNTER — Ambulatory Visit: Payer: 59 | Admitting: *Deleted

## 2021-05-28 ENCOUNTER — Ambulatory Visit (INDEPENDENT_AMBULATORY_CARE_PROVIDER_SITE_OTHER): Payer: 59

## 2021-05-28 ENCOUNTER — Other Ambulatory Visit: Payer: Self-pay

## 2021-05-28 ENCOUNTER — Ambulatory Visit: Payer: 59 | Attending: Maternal & Fetal Medicine

## 2021-05-28 VITALS — BP 134/52 | HR 93

## 2021-05-28 DIAGNOSIS — R638 Other symptoms and signs concerning food and fluid intake: Secondary | ICD-10-CM | POA: Insufficient documentation

## 2021-05-28 DIAGNOSIS — O10919 Unspecified pre-existing hypertension complicating pregnancy, unspecified trimester: Secondary | ICD-10-CM

## 2021-05-28 DIAGNOSIS — O99212 Obesity complicating pregnancy, second trimester: Secondary | ICD-10-CM

## 2021-05-28 DIAGNOSIS — O10012 Pre-existing essential hypertension complicating pregnancy, second trimester: Secondary | ICD-10-CM

## 2021-05-28 DIAGNOSIS — Z3689 Encounter for other specified antenatal screening: Secondary | ICD-10-CM | POA: Diagnosis not present

## 2021-05-28 DIAGNOSIS — O09522 Supervision of elderly multigravida, second trimester: Secondary | ICD-10-CM | POA: Insufficient documentation

## 2021-05-28 DIAGNOSIS — Z3A23 23 weeks gestation of pregnancy: Secondary | ICD-10-CM | POA: Diagnosis not present

## 2021-05-28 DIAGNOSIS — Z362 Encounter for other antenatal screening follow-up: Secondary | ICD-10-CM | POA: Diagnosis present

## 2021-05-28 NOTE — Progress Notes (Signed)
Subjective:  ?Lisa Charles is a 36 y.o. female here for BP check.  ? ?Hypertension ROS: taking medications as instructed, no medication side effects noted, no TIA's, no chest pain on exertion, no dyspnea on exertion, and no swelling of ankles.  ? ? ?Objective:  ?BP 128/74   Pulse 84   Ht 5\' 10"  (1.778 m)   Wt (!) 329 lb (149.2 kg)   LMP 12/07/2020 (Approximate)   BMI 47.21 kg/m?   ?Appearance alert, well appearing, and in no distress, oriented to person, place, and time, and overweight. ?General exam BP noted to be well controlled today in office.  ? ? ?Assessment:   ?Blood Pressure well controlled.  ? ?Plan:  ?Current treatment plan is effective, no change in therapy. Keep taking BP, medications and monitoring of BP at home. Go to MAU if sx gets worse or fails to improve.  ?

## 2021-05-31 ENCOUNTER — Other Ambulatory Visit: Payer: Self-pay | Admitting: *Deleted

## 2021-05-31 DIAGNOSIS — O10912 Unspecified pre-existing hypertension complicating pregnancy, second trimester: Secondary | ICD-10-CM

## 2021-05-31 DIAGNOSIS — Z362 Encounter for other antenatal screening follow-up: Secondary | ICD-10-CM

## 2021-05-31 DIAGNOSIS — O09522 Supervision of elderly multigravida, second trimester: Secondary | ICD-10-CM

## 2021-06-10 ENCOUNTER — Encounter: Payer: Self-pay | Admitting: Obstetrics and Gynecology

## 2021-06-10 ENCOUNTER — Ambulatory Visit (INDEPENDENT_AMBULATORY_CARE_PROVIDER_SITE_OTHER): Payer: 59 | Admitting: Obstetrics and Gynecology

## 2021-06-10 VITALS — BP 139/82 | HR 89 | Wt 333.0 lb

## 2021-06-10 DIAGNOSIS — O10919 Unspecified pre-existing hypertension complicating pregnancy, unspecified trimester: Secondary | ICD-10-CM

## 2021-06-10 DIAGNOSIS — Z3480 Encounter for supervision of other normal pregnancy, unspecified trimester: Secondary | ICD-10-CM

## 2021-06-10 DIAGNOSIS — O09522 Supervision of elderly multigravida, second trimester: Secondary | ICD-10-CM

## 2021-06-10 DIAGNOSIS — Z3009 Encounter for other general counseling and advice on contraception: Secondary | ICD-10-CM

## 2021-06-10 NOTE — Progress Notes (Signed)
? ?  PRENATAL VISIT NOTE ? ?Subjective:  ?Lisa Charles is a 36 y.o. G3P2002 at [redacted]w[redacted]d being seen today for ongoing prenatal care.  She is currently monitored for the following issues for this high-risk pregnancy and has Chronic hypertension during pregnancy, antepartum; Supervision of other normal pregnancy, antepartum; AMA (advanced maternal age) multigravida 35+; and Unwanted fertility on their problem list. ? ?Patient reports no complaints.  Contractions: Not present. Vag. Bleeding: None.  Movement: Present. Denies leaking of fluid.  ? ?The following portions of the patient's history were reviewed and updated as appropriate: allergies, current medications, past family history, past medical history, past social history, past surgical history and problem list.  ? ?Objective:  ? ?Vitals:  ? 06/10/21 1529  ?BP: 139/82  ?Pulse: 89  ?Weight: (!) 333 lb (151 kg)  ? ? ?Fetal Status: Fetal Heart Rate (bpm): 143 Fundal Height: 26 cm Movement: Present    ? ?General:  Alert, oriented and cooperative. Patient is in no acute distress.  ?Skin: Skin is warm and dry. No rash noted.   ?Cardiovascular: Normal heart rate noted  ?Respiratory: Normal respiratory effort, no problems with respiration noted  ?Abdomen: Soft, gravid, appropriate for gestational age.  Pain/Pressure: Present     ?Pelvic: Cervical exam deferred        ?Extremities: Normal range of motion.  Edema: Trace  ?Mental Status: Normal mood and affect. Normal behavior. Normal judgment and thought content.  ? ?Assessment and Plan:  ?Pregnancy: [id number] at [redacted]w[redacted]d ?1. Supervision of other normal pregnancy, antepartum ?Patient is doing well without complaints ?Third trimester labs and glucola next visit ? ?2. Chronic hypertension during pregnancy, antepartum ?BP stable on procardia 30 mg XL ?Continue ASA ?Follow up growth ultrasound per MFM schedule ?Patient advised to bring BP cuff for comparison as she reports higher readings at home as high as 160/90. Patient remains  asymptomatic ? ?3. Multigravida of advanced maternal age in second trimester ? ? ?4. Unwanted fertility ?BTL form signed today ? ?Preterm labor symptoms and general obstetric precautions including but not limited to vaginal bleeding, contractions, leaking of fluid and fetal movement were reviewed in detail with the patient. ?Please refer to After Visit Summary for other counseling recommendations.  ? ?Return in about 3 weeks (around 07/01/2021) for in person, ROB, High risk, 2 hr glucola next visit. ? ?Future Appointments  ?Date Time Provider [place]  ?06/30/2021  3:15 PM [facility] NURSE [facility]  ?06/30/2021  3:30 PM [facility] US2 [facility] [place]  ? ? ?Mora Bellman, MD ? ?

## 2021-06-30 ENCOUNTER — Ambulatory Visit: Payer: 59 | Admitting: *Deleted

## 2021-06-30 ENCOUNTER — Other Ambulatory Visit: Payer: Self-pay | Admitting: *Deleted

## 2021-06-30 ENCOUNTER — Ambulatory Visit: Payer: 59 | Attending: Obstetrics

## 2021-06-30 VITALS — BP 125/60 | HR 83

## 2021-06-30 DIAGNOSIS — O09522 Supervision of elderly multigravida, second trimester: Secondary | ICD-10-CM

## 2021-06-30 DIAGNOSIS — O10912 Unspecified pre-existing hypertension complicating pregnancy, second trimester: Secondary | ICD-10-CM

## 2021-06-30 DIAGNOSIS — Z362 Encounter for other antenatal screening follow-up: Secondary | ICD-10-CM | POA: Diagnosis present

## 2021-06-30 DIAGNOSIS — O10913 Unspecified pre-existing hypertension complicating pregnancy, third trimester: Secondary | ICD-10-CM

## 2021-06-30 DIAGNOSIS — O99213 Obesity complicating pregnancy, third trimester: Secondary | ICD-10-CM

## 2021-06-30 DIAGNOSIS — O09523 Supervision of elderly multigravida, third trimester: Secondary | ICD-10-CM | POA: Diagnosis present

## 2021-06-30 DIAGNOSIS — Z3A28 28 weeks gestation of pregnancy: Secondary | ICD-10-CM

## 2021-06-30 DIAGNOSIS — O10013 Pre-existing essential hypertension complicating pregnancy, third trimester: Secondary | ICD-10-CM

## 2021-06-30 DIAGNOSIS — E669 Obesity, unspecified: Secondary | ICD-10-CM

## 2021-07-01 ENCOUNTER — Ambulatory Visit (INDEPENDENT_AMBULATORY_CARE_PROVIDER_SITE_OTHER): Payer: 59 | Admitting: Obstetrics and Gynecology

## 2021-07-01 ENCOUNTER — Other Ambulatory Visit: Payer: 59

## 2021-07-01 ENCOUNTER — Encounter: Payer: Self-pay | Admitting: Obstetrics and Gynecology

## 2021-07-01 VITALS — BP 124/77 | HR 84 | Wt 335.0 lb

## 2021-07-01 DIAGNOSIS — O09523 Supervision of elderly multigravida, third trimester: Secondary | ICD-10-CM

## 2021-07-01 DIAGNOSIS — Z23 Encounter for immunization: Secondary | ICD-10-CM | POA: Diagnosis not present

## 2021-07-01 DIAGNOSIS — O10919 Unspecified pre-existing hypertension complicating pregnancy, unspecified trimester: Secondary | ICD-10-CM

## 2021-07-01 DIAGNOSIS — Z3009 Encounter for other general counseling and advice on contraception: Secondary | ICD-10-CM

## 2021-07-01 DIAGNOSIS — O99210 Obesity complicating pregnancy, unspecified trimester: Secondary | ICD-10-CM | POA: Insufficient documentation

## 2021-07-01 DIAGNOSIS — Z3480 Encounter for supervision of other normal pregnancy, unspecified trimester: Secondary | ICD-10-CM

## 2021-07-01 NOTE — Progress Notes (Signed)
? ?  PRENATAL VISIT NOTE ? ?Subjective:  ?Lisa Charles is a 36 y.o. G3P2002 at [redacted]w[redacted]d being seen today for ongoing prenatal care.  She is currently monitored for the following issues for this high-risk pregnancy and has Chronic hypertension during pregnancy, antepartum; Supervision of other normal pregnancy, antepartum; AMA (advanced maternal age) multigravida 67+; Unwanted fertility; and Maternal obesity affecting pregnancy, antepartum on their problem list. ? ?Patient reports no complaints.  Contractions: Not present. Vag. Bleeding: None.  Movement: Present. Denies leaking of fluid.  ? ?The following portions of the patient's history were reviewed and updated as appropriate: allergies, current medications, past family history, past medical history, past social history, past surgical history and problem list.  ? ?Objective:  ? ?Vitals:  ? 07/01/21 0825  ?BP: 124/77  ?Pulse: 84  ?Weight: (!) 335 lb (152 kg)  ? ? ?Fetal Status: Fetal Heart Rate (bpm): 140 Fundal Height: 28 cm Movement: Present    ? ?General:  Alert, oriented and cooperative. Patient is in no acute distress.  ?Skin: Skin is warm and dry. No rash noted.   ?Cardiovascular: Normal heart rate noted  ?Respiratory: Normal respiratory effort, no problems with respiration noted  ?Abdomen: Soft, gravid, appropriate for gestational age.  Pain/Pressure: Present     ?Pelvic: Cervical exam deferred        ?Extremities: Normal range of motion.     ?Mental Status: Normal mood and affect. Normal behavior. Normal judgment and thought content.  ? ?Assessment and Plan:  ?Pregnancy: G3P2002 at [redacted]w[redacted]d ?1. Supervision of other normal pregnancy, antepartum ?Patient is doing well without complaints ?Third trimester labs, glucola and tdap today ? ?2. Chronic hypertension during pregnancy, antepartum ?Continue procardia and ASA ?Stable without symptoms ?Follow up growth ultrasound and antenatal testing starting at 32 weeks ? ?3. Multigravida of advanced maternal age in third  trimester ? ? ?4. Unwanted fertility ?BTL form previously signed ? ?5. Maternal obesity affecting pregnancy, antepartum ? ? ?Preterm labor symptoms and general obstetric precautions including but not limited to vaginal bleeding, contractions, leaking of fluid and fetal movement were reviewed in detail with the patient. ?Please refer to After Visit Summary for other counseling recommendations.  ? ?Return in about 2 weeks (around 07/15/2021) for in person, ROB, High risk. ? ?Future Appointments  ?Date Time Provider [place]  ?07/28/2021  3:15 PM [facility] NURSE [facility]  ?07/28/2021  3:30 PM [facility] US3 [facility] [place]  ?08/04/2021  2:15 PM [facility] NURSE [facility]  ?08/04/2021  2:30 PM [facility] US3 [facility] [place]  ?08/11/2021  2:15 PM [facility] NURSE [facility]  ?08/11/2021  2:30 PM [facility] US3 [facility]  ?08/18/2021  2:15 PM [facility] NURSE [facility]  ?08/18/2021  2:30 PM [facility] US3 [facility]  ? ? ?Mora Bellman, MD ? ?

## 2021-07-02 LAB — CBC
Hematocrit: 32.1 % — ABNORMAL LOW (ref 34.0–46.6)
Hemoglobin: 10.7 g/dL — ABNORMAL LOW (ref 11.1–15.9)
MCH: 26.5 pg — ABNORMAL LOW (ref 26.6–33.0)
MCHC: 33.3 g/dL (ref 31.5–35.7)
MCV: 80 fL (ref 79–97)
Platelets: 295 10*3/uL (ref 150–450)
RBC: 4.04 x10E6/uL (ref 3.77–5.28)
RDW: 15.6 % — ABNORMAL HIGH (ref 11.7–15.4)
WBC: 11.6 10*3/uL — ABNORMAL HIGH (ref 3.4–10.8)

## 2021-07-02 LAB — GLUCOSE TOLERANCE, 2 HOURS W/ 1HR
Glucose, 1 hour: 131 mg/dL (ref 70–179)
Glucose, 2 hour: 94 mg/dL (ref 70–152)
Glucose, Fasting: 87 mg/dL (ref 70–91)

## 2021-07-02 LAB — RPR: RPR Ser Ql: NONREACTIVE

## 2021-07-02 LAB — HIV ANTIBODY (ROUTINE TESTING W REFLEX): HIV Screen 4th Generation wRfx: NONREACTIVE

## 2021-07-14 NOTE — Progress Notes (Signed)
? ?  PRENATAL VISIT NOTE ? ?Subjective:  ?Lisa Charles is a 36 y.o. G3P2002 at [redacted]w[redacted]d being seen today for ongoing prenatal care.  She is currently monitored for the following issues for this high-risk pregnancy and has Chronic hypertension during pregnancy, antepartum; Supervision of other normal pregnancy, antepartum; AMA (advanced maternal age) multigravida 17+; Unwanted fertility; and Maternal obesity affecting pregnancy, antepartum on their problem list. ? ?Patient reports  no complaints .  Contractions: Not present. Vag. Bleeding: None.  Movement: Present. Denies leaking of fluid.  ? ?The following portions of the patient's history were reviewed and updated as appropriate: allergies, current medications, past family history, past medical history, past social history, past surgical history and problem list.  ? ?Objective:  ? ?Vitals:  ? 07/15/21 1558  ?BP: 133/87  ?Pulse: 84  ?Weight: (!) 335 lb (152 kg)  ? ? ?Fetal Status: Fetal Heart Rate (bpm): 149 Fundal Height: 34 cm Movement: Present    ? ?General:  Alert, oriented and cooperative. Patient is in no acute distress.  ?Skin: Skin is warm and dry. No rash noted.   ?Cardiovascular: Normal heart rate noted  ?Respiratory: Normal respiratory effort, no problems with respiration noted  ?Abdomen: Soft, gravid, appropriate for gestational age.  Pain/Pressure: Present     ?Pelvic: Cervical exam deferred        ?Extremities: Normal range of motion.  Edema: Trace  ?Mental Status: Normal mood and affect. Normal behavior. Normal judgment and thought content.  ? ?Assessment and Plan:  ?Pregnancy: [id number] at [redacted]w[redacted]d ?1. Maternal obesity affecting pregnancy, antepartum   ?2. [redacted] weeks gestation of pregnancy   ?-keep taking baby ASA and Nifedipine ?-patient is doing well otherwise; reviewed signs of pre-e ? ?Preterm labor symptoms and general obstetric precautions including but not limited to vaginal bleeding, contractions, leaking of fluid and fetal movement were reviewed in  detail with the patient. ?Please refer to After Visit Summary for other counseling recommendations.  ? ?Return in about 2 weeks (around 07/29/2021), or HROB. ? ?Future Appointments  ?Date Time Provider [place]  ?07/28/2021  3:15 PM [facility] NURSE [facility]  ?07/28/2021  3:30 PM [facility] US3 [facility] [place]  ?07/29/2021  4:10 PM Woodroe Mode, MD [facility] None  ?08/04/2021  2:15 PM [facility] NURSE [facility]  ?08/04/2021  2:30 PM [facility] US3 [facility] [place]  ?08/11/2021  2:15 PM [facility] NURSE [facility]  ?08/11/2021  2:30 PM [facility] US3 [facility]  ?08/18/2021  2:15 PM [facility] NURSE [facility]  ?08/18/2021  2:30 PM [facility] US3 [facility]  ? ? ?Starr [place], CNM ? ?

## 2021-07-15 ENCOUNTER — Ambulatory Visit (INDEPENDENT_AMBULATORY_CARE_PROVIDER_SITE_OTHER): Payer: 59 | Admitting: Student

## 2021-07-15 VITALS — BP 133/87 | HR 84 | Wt 335.0 lb

## 2021-07-15 DIAGNOSIS — O99210 Obesity complicating pregnancy, unspecified trimester: Secondary | ICD-10-CM

## 2021-07-15 DIAGNOSIS — Z3A30 30 weeks gestation of pregnancy: Secondary | ICD-10-CM

## 2021-07-15 DIAGNOSIS — Z3480 Encounter for supervision of other normal pregnancy, unspecified trimester: Secondary | ICD-10-CM

## 2021-07-15 NOTE — Progress Notes (Signed)
Pt presents for ROB. Has pelvic pressure, interested in maternity belt.  ?

## 2021-07-28 ENCOUNTER — Ambulatory Visit: Payer: 59 | Attending: Maternal & Fetal Medicine

## 2021-07-28 ENCOUNTER — Ambulatory Visit: Payer: 59 | Admitting: *Deleted

## 2021-07-28 VITALS — BP 129/53 | HR 76

## 2021-07-28 DIAGNOSIS — E669 Obesity, unspecified: Secondary | ICD-10-CM

## 2021-07-28 DIAGNOSIS — O10013 Pre-existing essential hypertension complicating pregnancy, third trimester: Secondary | ICD-10-CM

## 2021-07-28 DIAGNOSIS — O10913 Unspecified pre-existing hypertension complicating pregnancy, third trimester: Secondary | ICD-10-CM | POA: Diagnosis present

## 2021-07-28 DIAGNOSIS — O99210 Obesity complicating pregnancy, unspecified trimester: Secondary | ICD-10-CM | POA: Diagnosis present

## 2021-07-28 DIAGNOSIS — O99213 Obesity complicating pregnancy, third trimester: Secondary | ICD-10-CM

## 2021-07-28 DIAGNOSIS — O09523 Supervision of elderly multigravida, third trimester: Secondary | ICD-10-CM | POA: Diagnosis not present

## 2021-07-28 DIAGNOSIS — Z3A32 32 weeks gestation of pregnancy: Secondary | ICD-10-CM

## 2021-07-29 ENCOUNTER — Encounter: Payer: Self-pay | Admitting: Obstetrics & Gynecology

## 2021-07-29 ENCOUNTER — Ambulatory Visit (INDEPENDENT_AMBULATORY_CARE_PROVIDER_SITE_OTHER): Payer: 59 | Admitting: Obstetrics & Gynecology

## 2021-07-29 VITALS — BP 135/81 | HR 87 | Wt 343.5 lb

## 2021-07-29 DIAGNOSIS — Z3480 Encounter for supervision of other normal pregnancy, unspecified trimester: Secondary | ICD-10-CM

## 2021-07-29 DIAGNOSIS — Z3A32 32 weeks gestation of pregnancy: Secondary | ICD-10-CM

## 2021-07-29 DIAGNOSIS — O99210 Obesity complicating pregnancy, unspecified trimester: Secondary | ICD-10-CM

## 2021-07-29 DIAGNOSIS — O10919 Unspecified pre-existing hypertension complicating pregnancy, unspecified trimester: Secondary | ICD-10-CM

## 2021-07-29 NOTE — Progress Notes (Signed)
   PRENATAL VISIT NOTE  Subjective:  Lisa Charles is a 36 y.o. G3P2002 at [redacted]w[redacted]d being seen today for ongoing prenatal care.  She is currently monitored for the following issues for this high-risk pregnancy and has Chronic hypertension during pregnancy, antepartum; Supervision of other normal pregnancy, antepartum; AMA (advanced maternal age) multigravida 6+; Unwanted fertility; and Maternal obesity affecting pregnancy, antepartum on their problem list.  Patient reports no complaints.  Contractions: Not present. Vag. Bleeding: None.  Movement: Present. Denies leaking of fluid.   The following portions of the patient's history were reviewed and updated as appropriate: allergies, current medications, past family history, past medical history, past social history, past surgical history and problem list.   Objective:   Vitals:   07/29/21 1602  BP: 135/81  Pulse: 87  Weight: (!) 343 lb 8 oz (155.8 kg)    Fetal Status:     Movement: Present     General:  Alert, oriented and cooperative. Patient is in no acute distress.  Skin: Skin is warm and dry. No rash noted.   Cardiovascular: Normal heart rate noted  Respiratory: Normal respiratory effort, no problems with respiration noted  Abdomen: Soft, gravid, appropriate for gestational age.  Pain/Pressure: Present     Pelvic: Cervical exam deferred        Extremities: Normal range of motion.  Edema: Trace  Mental Status: Normal mood and affect. Normal behavior. Normal judgment and thought content.   Assessment and Plan:  Pregnancy: G3P2002 at [redacted]w[redacted]d 1. Supervision of other normal pregnancy, antepartum F/u with weekly BPP  2. Maternal obesity affecting pregnancy, antepartum Body mass index is 49.29 kg/m. 3. CHTN well controlled  Preterm labor symptoms and general obstetric precautions including but not limited to vaginal bleeding, contractions, leaking of fluid and fetal movement were reviewed in detail with the patient. Please refer to  After Visit Summary for other counseling recommendations.   Return in about 2 weeks (around 08/12/2021).  Future Appointments  Date Time Provider [place]  08/04/2021  2:15 PM [facility] NURSE [facility]  08/04/2021  2:30 PM [facility] US3 [facility]  08/11/2021  2:15 PM [facility] NURSE [facility]  08/11/2021  2:30 PM [facility]3 [facility]  08/18/2021  2:15 PM [facility] NURSE [facility]  08/18/2021  2:30 PM [facility] US3 [facility]    Emeterio Reeve, MD

## 2021-08-04 ENCOUNTER — Ambulatory Visit: Payer: 59 | Admitting: *Deleted

## 2021-08-04 ENCOUNTER — Ambulatory Visit: Payer: 59 | Attending: Maternal & Fetal Medicine

## 2021-08-04 VITALS — BP 132/47 | HR 87

## 2021-08-04 DIAGNOSIS — Z3A33 33 weeks gestation of pregnancy: Secondary | ICD-10-CM

## 2021-08-04 DIAGNOSIS — E669 Obesity, unspecified: Secondary | ICD-10-CM

## 2021-08-04 DIAGNOSIS — O09523 Supervision of elderly multigravida, third trimester: Secondary | ICD-10-CM | POA: Insufficient documentation

## 2021-08-04 DIAGNOSIS — O10913 Unspecified pre-existing hypertension complicating pregnancy, third trimester: Secondary | ICD-10-CM | POA: Diagnosis present

## 2021-08-04 DIAGNOSIS — O10013 Pre-existing essential hypertension complicating pregnancy, third trimester: Secondary | ICD-10-CM

## 2021-08-04 DIAGNOSIS — O99210 Obesity complicating pregnancy, unspecified trimester: Secondary | ICD-10-CM

## 2021-08-04 DIAGNOSIS — O99213 Obesity complicating pregnancy, third trimester: Secondary | ICD-10-CM | POA: Diagnosis present

## 2021-08-05 ENCOUNTER — Other Ambulatory Visit: Payer: Self-pay | Admitting: *Deleted

## 2021-08-05 DIAGNOSIS — O09523 Supervision of elderly multigravida, third trimester: Secondary | ICD-10-CM

## 2021-08-05 DIAGNOSIS — O10913 Unspecified pre-existing hypertension complicating pregnancy, third trimester: Secondary | ICD-10-CM

## 2021-08-05 DIAGNOSIS — O99213 Obesity complicating pregnancy, third trimester: Secondary | ICD-10-CM

## 2021-08-11 ENCOUNTER — Ambulatory Visit: Payer: 59 | Admitting: *Deleted

## 2021-08-11 ENCOUNTER — Other Ambulatory Visit: Payer: Self-pay | Admitting: *Deleted

## 2021-08-11 ENCOUNTER — Encounter: Payer: Self-pay | Admitting: Obstetrics and Gynecology

## 2021-08-11 ENCOUNTER — Ambulatory Visit: Payer: 59 | Attending: Maternal & Fetal Medicine

## 2021-08-11 VITALS — BP 134/51 | HR 80

## 2021-08-11 DIAGNOSIS — E669 Obesity, unspecified: Secondary | ICD-10-CM

## 2021-08-11 DIAGNOSIS — O99210 Obesity complicating pregnancy, unspecified trimester: Secondary | ICD-10-CM

## 2021-08-11 DIAGNOSIS — O10913 Unspecified pre-existing hypertension complicating pregnancy, third trimester: Secondary | ICD-10-CM

## 2021-08-11 DIAGNOSIS — O09523 Supervision of elderly multigravida, third trimester: Secondary | ICD-10-CM

## 2021-08-11 DIAGNOSIS — O99213 Obesity complicating pregnancy, third trimester: Secondary | ICD-10-CM

## 2021-08-11 DIAGNOSIS — Z3A34 34 weeks gestation of pregnancy: Secondary | ICD-10-CM

## 2021-08-11 DIAGNOSIS — O10013 Pre-existing essential hypertension complicating pregnancy, third trimester: Secondary | ICD-10-CM

## 2021-08-12 ENCOUNTER — Other Ambulatory Visit: Payer: Self-pay | Admitting: *Deleted

## 2021-08-12 DIAGNOSIS — O10913 Unspecified pre-existing hypertension complicating pregnancy, third trimester: Secondary | ICD-10-CM

## 2021-08-13 ENCOUNTER — Encounter: Payer: Self-pay | Admitting: Obstetrics and Gynecology

## 2021-08-13 ENCOUNTER — Ambulatory Visit (INDEPENDENT_AMBULATORY_CARE_PROVIDER_SITE_OTHER): Payer: 59 | Admitting: Obstetrics and Gynecology

## 2021-08-13 VITALS — BP 133/82 | HR 80 | Wt 343.0 lb

## 2021-08-13 DIAGNOSIS — Z3480 Encounter for supervision of other normal pregnancy, unspecified trimester: Secondary | ICD-10-CM

## 2021-08-13 DIAGNOSIS — O10919 Unspecified pre-existing hypertension complicating pregnancy, unspecified trimester: Secondary | ICD-10-CM

## 2021-08-13 DIAGNOSIS — Z3009 Encounter for other general counseling and advice on contraception: Secondary | ICD-10-CM

## 2021-08-13 DIAGNOSIS — O09523 Supervision of elderly multigravida, third trimester: Secondary | ICD-10-CM

## 2021-08-13 NOTE — Patient Instructions (Signed)

## 2021-08-13 NOTE — Progress Notes (Signed)
Pt presents for ROB without concerns today. Next BPP and growth 08/18/21

## 2021-08-13 NOTE — Progress Notes (Signed)
Subjective:  Lisa Charles is a 36 y.o. G3P2002 at [redacted]w[redacted]d being seen today for ongoing prenatal care.  She is currently monitored for the following issues for this high-risk pregnancy and has Chronic hypertension during pregnancy, antepartum; Supervision of other normal pregnancy, antepartum; AMA (advanced maternal age) multigravida 20+; Unwanted fertility; and Maternal obesity affecting pregnancy, antepartum on their problem list.  Patient reports general discomforts of pregnancy.  Contractions: Not present. Vag. Bleeding: None.  Movement: Present. Denies leaking of fluid.   The following portions of the patient's history were reviewed and updated as appropriate: allergies, current medications, past family history, past medical history, past social history, past surgical history and problem list. Problem list updated.  Objective:   Vitals:   08/13/21 0958  BP: 133/82  Pulse: 80  Weight: (!) 343 lb (155.6 kg)    Fetal Status: Fetal Heart Rate (bpm): 141    Movement: Present     General:  Alert, oriented and cooperative. Patient is in no acute distress.  Skin: Skin is warm and dry. No rash noted.   Cardiovascular: Normal heart rate noted  Respiratory: Normal respiratory effort, no problems with respiration noted  Abdomen: Soft, gravid, appropriate for gestational age. Pain/Pressure: Present     Pelvic:  Cervical exam deferred        Extremities: Normal range of motion.  Edema: Trace  Mental Status: Normal mood and affect. Normal behavior. Normal judgment and thought content.   Urinalysis:      Assessment and Plan:  Pregnancy: G3P2002 at [redacted]w[redacted]d  1. Supervision of other normal pregnancy, antepartum Stable GBS next visit  2. Chronic hypertension during pregnancy, antepartum BP stable Continue with current management Continue with serial growth scans and antenatal testing as per MFM IOL at 39 weeks, scheduled  3. Multigravida of advanced maternal age in third trimester Nl  genetics  4. Unwanted fertility BTL papers signed  Preterm labor symptoms and general obstetric precautions including but not limited to vaginal bleeding, contractions, leaking of fluid and fetal movement were reviewed in detail with the patient. Please refer to After Visit Summary for other counseling recommendations.  Return in about 2 weeks (around 08/27/2021) for OB visit, face to face, MD only.   Chancy Milroy, MD

## 2021-08-18 ENCOUNTER — Ambulatory Visit: Payer: 59 | Admitting: *Deleted

## 2021-08-18 ENCOUNTER — Ambulatory Visit: Payer: 59 | Attending: Maternal & Fetal Medicine

## 2021-08-18 VITALS — BP 124/56 | HR 84

## 2021-08-18 DIAGNOSIS — O99213 Obesity complicating pregnancy, third trimester: Secondary | ICD-10-CM | POA: Diagnosis present

## 2021-08-18 DIAGNOSIS — E669 Obesity, unspecified: Secondary | ICD-10-CM

## 2021-08-18 DIAGNOSIS — O99210 Obesity complicating pregnancy, unspecified trimester: Secondary | ICD-10-CM | POA: Insufficient documentation

## 2021-08-18 DIAGNOSIS — Z3A35 35 weeks gestation of pregnancy: Secondary | ICD-10-CM

## 2021-08-18 DIAGNOSIS — O09523 Supervision of elderly multigravida, third trimester: Secondary | ICD-10-CM | POA: Insufficient documentation

## 2021-08-18 DIAGNOSIS — O10013 Pre-existing essential hypertension complicating pregnancy, third trimester: Secondary | ICD-10-CM | POA: Diagnosis not present

## 2021-08-18 DIAGNOSIS — O10913 Unspecified pre-existing hypertension complicating pregnancy, third trimester: Secondary | ICD-10-CM | POA: Diagnosis present

## 2021-08-24 ENCOUNTER — Encounter (HOSPITAL_COMMUNITY): Payer: Self-pay | Admitting: *Deleted

## 2021-08-24 ENCOUNTER — Telehealth (HOSPITAL_COMMUNITY): Payer: Self-pay | Admitting: *Deleted

## 2021-08-24 NOTE — Telephone Encounter (Signed)
Preadmission screen  

## 2021-08-25 ENCOUNTER — Ambulatory Visit: Payer: 59 | Attending: Maternal & Fetal Medicine

## 2021-08-25 ENCOUNTER — Ambulatory Visit: Payer: 59 | Admitting: *Deleted

## 2021-08-25 ENCOUNTER — Other Ambulatory Visit: Payer: Self-pay | Admitting: Maternal & Fetal Medicine

## 2021-08-25 VITALS — BP 129/49 | HR 77

## 2021-08-25 DIAGNOSIS — Z3A36 36 weeks gestation of pregnancy: Secondary | ICD-10-CM

## 2021-08-25 DIAGNOSIS — O99210 Obesity complicating pregnancy, unspecified trimester: Secondary | ICD-10-CM | POA: Diagnosis present

## 2021-08-25 DIAGNOSIS — O99213 Obesity complicating pregnancy, third trimester: Secondary | ICD-10-CM | POA: Insufficient documentation

## 2021-08-25 DIAGNOSIS — O10013 Pre-existing essential hypertension complicating pregnancy, third trimester: Secondary | ICD-10-CM | POA: Diagnosis not present

## 2021-08-25 DIAGNOSIS — O09523 Supervision of elderly multigravida, third trimester: Secondary | ICD-10-CM | POA: Insufficient documentation

## 2021-08-25 DIAGNOSIS — O10913 Unspecified pre-existing hypertension complicating pregnancy, third trimester: Secondary | ICD-10-CM

## 2021-08-25 DIAGNOSIS — E669 Obesity, unspecified: Secondary | ICD-10-CM

## 2021-08-25 NOTE — Procedures (Signed)
Lisa Charles Nov 07, 1985 [redacted]w[redacted]d  Fetus A Non-Stress Test Interpretation for 08/25/21  Indication: Chronic Hypertenstion  Fetal Heart Rate A Mode: External Baseline Rate (A): 125 bpm Variability: Moderate Accelerations: 15 x 15 Decelerations: None Multiple birth?: No  Uterine Activity Mode: Toco Contraction Frequency (min): rare Contraction Duration (sec): 70 Contraction Quality: Mild Resting Tone Palpated: Relaxed  Interpretation (Fetal Testing) Nonstress Test Interpretation: Reactive Overall Impression: Reassuring for gestational age Comments: tracing reviewed by Dr. Judeth Cornfield

## 2021-08-26 ENCOUNTER — Other Ambulatory Visit (HOSPITAL_COMMUNITY)
Admission: RE | Admit: 2021-08-26 | Discharge: 2021-08-26 | Disposition: A | Discharge status: Home / Self Care | Payer: 59 | Source: Ambulatory Visit | Attending: Obstetrics and Gynecology | Admitting: Obstetrics and Gynecology

## 2021-08-26 ENCOUNTER — Ambulatory Visit (INDEPENDENT_AMBULATORY_CARE_PROVIDER_SITE_OTHER): Payer: Medicaid Other | Admitting: Obstetrics and Gynecology

## 2021-08-26 ENCOUNTER — Encounter: Payer: Self-pay | Admitting: Obstetrics and Gynecology

## 2021-08-26 VITALS — BP 131/78 | HR 76 | Wt 349.0 lb

## 2021-08-26 DIAGNOSIS — Z3480 Encounter for supervision of other normal pregnancy, unspecified trimester: Secondary | ICD-10-CM | POA: Diagnosis present

## 2021-08-26 DIAGNOSIS — O10919 Unspecified pre-existing hypertension complicating pregnancy, unspecified trimester: Secondary | ICD-10-CM

## 2021-08-26 DIAGNOSIS — O99210 Obesity complicating pregnancy, unspecified trimester: Secondary | ICD-10-CM

## 2021-08-26 DIAGNOSIS — Z3A36 36 weeks gestation of pregnancy: Secondary | ICD-10-CM

## 2021-08-26 DIAGNOSIS — Z6841 Body Mass Index (BMI) 40.0 and over, adult: Secondary | ICD-10-CM | POA: Insufficient documentation

## 2021-08-26 DIAGNOSIS — O09523 Supervision of elderly multigravida, third trimester: Secondary | ICD-10-CM

## 2021-08-26 NOTE — Progress Notes (Signed)
Pt presents for ROB, GBS, and GC/CT. No complaints per pt

## 2021-08-27 LAB — CERVICOVAGINAL ANCILLARY ONLY
Chlamydia: NEGATIVE
Comment: NEGATIVE
Comment: NORMAL
Neisseria Gonorrhea: NEGATIVE

## 2021-08-28 LAB — STREP GP B NAA: Strep Gp B NAA: POSITIVE — AB

## 2021-09-01 ENCOUNTER — Ambulatory Visit: Payer: 59 | Admitting: *Deleted

## 2021-09-01 ENCOUNTER — Ambulatory Visit: Payer: 59 | Attending: Maternal & Fetal Medicine

## 2021-09-01 VITALS — BP 129/65 | HR 78

## 2021-09-01 DIAGNOSIS — O09523 Supervision of elderly multigravida, third trimester: Secondary | ICD-10-CM | POA: Insufficient documentation

## 2021-09-01 DIAGNOSIS — O10913 Unspecified pre-existing hypertension complicating pregnancy, third trimester: Secondary | ICD-10-CM | POA: Insufficient documentation

## 2021-09-01 DIAGNOSIS — O99210 Obesity complicating pregnancy, unspecified trimester: Secondary | ICD-10-CM | POA: Diagnosis present

## 2021-09-01 DIAGNOSIS — Z3A37 37 weeks gestation of pregnancy: Secondary | ICD-10-CM

## 2021-09-01 DIAGNOSIS — E669 Obesity, unspecified: Secondary | ICD-10-CM | POA: Diagnosis not present

## 2021-09-01 DIAGNOSIS — O10013 Pre-existing essential hypertension complicating pregnancy, third trimester: Secondary | ICD-10-CM

## 2021-09-01 DIAGNOSIS — O99213 Obesity complicating pregnancy, third trimester: Secondary | ICD-10-CM | POA: Diagnosis not present

## 2021-09-02 ENCOUNTER — Encounter: Payer: Self-pay | Admitting: Obstetrics and Gynecology

## 2021-09-02 ENCOUNTER — Ambulatory Visit (INDEPENDENT_AMBULATORY_CARE_PROVIDER_SITE_OTHER): Payer: Medicaid Other | Admitting: Obstetrics and Gynecology

## 2021-09-02 VITALS — BP 138/79 | HR 88 | Wt 343.0 lb

## 2021-09-02 DIAGNOSIS — Z3009 Encounter for other general counseling and advice on contraception: Secondary | ICD-10-CM

## 2021-09-02 DIAGNOSIS — Z3480 Encounter for supervision of other normal pregnancy, unspecified trimester: Secondary | ICD-10-CM

## 2021-09-02 DIAGNOSIS — O09523 Supervision of elderly multigravida, third trimester: Secondary | ICD-10-CM

## 2021-09-02 DIAGNOSIS — O10919 Unspecified pre-existing hypertension complicating pregnancy, unspecified trimester: Secondary | ICD-10-CM

## 2021-09-02 DIAGNOSIS — O99820 Streptococcus B carrier state complicating pregnancy: Secondary | ICD-10-CM

## 2021-09-02 DIAGNOSIS — O99210 Obesity complicating pregnancy, unspecified trimester: Secondary | ICD-10-CM

## 2021-09-02 NOTE — Progress Notes (Signed)
Subjective:  Lisa Charles is a 36 y.o. G3P2002 at [redacted]w[redacted]d being seen today for ongoing prenatal care.  She is currently monitored for the following issues for this high-risk pregnancy and has Chronic hypertension during pregnancy, antepartum; GBS (group B Streptococcus carrier), +RV culture, currently pregnant; Supervision of other normal pregnancy, antepartum; AMA (advanced maternal age) multigravida 35+; Unwanted fertility; Maternal obesity affecting pregnancy, antepartum; and BMI 50.0-59.9, adult (HCC) on their problem list.  Patient reports general discomforts of pregnancy.  Contractions: Not present. Vag. Bleeding: None.  Movement: Present. Denies leaking of fluid.   The following portions of the patient's history were reviewed and updated as appropriate: allergies, current medications, past family history, past medical history, past social history, past surgical history and problem list. Problem list updated.  Objective:   Vitals:   09/02/21 1528  BP: 138/79  Pulse: 88  Weight: (!) 343 lb (155.6 kg)    Fetal Status: Fetal Heart Rate (bpm): 146   Movement: Present     General:  Alert, oriented and cooperative. Patient is in no acute distress.  Skin: Skin is warm and dry. No rash noted.   Cardiovascular: Normal heart rate noted  Respiratory: Normal respiratory effort, no problems with respiration noted  Abdomen: Soft, gravid, appropriate for gestational age. Pain/Pressure: Present     Pelvic:  Cervical exam deferred        Extremities: Normal range of motion.  Edema: Trace  Mental Status: Normal mood and affect. Normal behavior. Normal judgment and thought content.   Urinalysis:      Assessment and Plan:  Pregnancy: G3P2002 at [redacted]w[redacted]d  1. Maternal obesity affecting pregnancy, antepartum Stable  2. Supervision of other normal pregnancy, antepartum Stable Labor precautions   3. Chronic hypertension during pregnancy, antepartum BP stable No S/Sx of PEC Continue with current  management Serial growth scans and antenatal testing as per MFM IOL scheduled  4. Multigravida of advanced maternal age in third trimester Stable  5. Unwanted fertility BTL papers isgned  6. GBS (group B Streptococcus carrier), +RV culture, currently pregnant Tx while in labor  Term labor symptoms and general obstetric precautions including but not limited to vaginal bleeding, contractions, leaking of fluid and fetal movement were reviewed in detail with the patient. Please refer to After Visit Summary for other counseling recommendations.  Return in about 1 week (around 09/09/2021) for OB visit, face to face, MD only.   Hermina Staggers, MD

## 2021-09-02 NOTE — Progress Notes (Signed)
Pt presents for ROB visit. No concerns at this time.  

## 2021-09-02 NOTE — Patient Instructions (Signed)
Vaginal Delivery  Vaginal delivery means that you give birth by pushing your baby out of your birth canal (vagina). Your health care team will help you before, during, and after vaginal delivery. Birth experiences are unique for every woman and every pregnancy, and birth experiences vary depending on where you choose to give birth. What are the risks and benefits? Generally, this is safe. However, problems may occur, including: Bleeding. Infection. Damage to other structures such as vaginal tearing. Allergic reactions to medicines. Despite the risks, benefits of vaginal delivery include less risk of bleeding and infection and a shorter recovery time compared to a Cesarean delivery. Cesarean delivery, or C-section, is the surgical delivery of a baby. What happens when I arrive at the birth center or hospital? Once you are in labor and have been admitted into the hospital or birth center, your health care team may: Review your pregnancy history and any concerns that you have. Talk with you about your birth plan and discuss pain control options. Check your blood pressure, breathing, and heartbeat. Assess your baby's heartbeat. Monitor your uterus for contractions. Check whether your bag of water (amniotic sac) has broken (ruptured). Insert an IV into one of your veins. This may be used to give you fluids and medicines. Monitoring Your health care team may assess your contractions (uterine monitoring) and your baby's heart rate (fetal monitoring). You may need to be monitored: Often, but not continuously (intermittently). All the time or for long periods at a time (continuously). Continuous monitoring may be needed if: You are taking certain medicines, such as medicine to relieve pain or make your contractions stronger. You have pregnancy or labor complications. Monitoring may be done by: Placing a special stethoscope or a handheld monitoring device on your abdomen to check your baby's  heartbeat and to check for contractions. Placing monitors on your abdomen (external monitors) to record your baby's heartbeat and the frequency and length of contractions. Placing monitors inside your uterus through your vagina (internal monitors) to record your baby's heartbeat and the frequency, length, and strength of your contractions. Depending on the type of monitor, it may remain in your uterus or on your baby's head until birth. Telemetry. This is a type of continuous monitoring that can be done with external or internal monitors. Instead of having to stay in bed, you are able to move around. Physical exam Your health care team may perform frequent physical exams. This may include: Checking how and where your baby is positioned in your uterus. Checking your cervix to determine: Whether it is thinning out (effacing). Whether it is opening up (dilating). What happens during labor and delivery?  Normal labor and delivery is divided into the following three stages: Stage 1 This is the longest stage of labor. Throughout this stage, you will feel contractions. Contractions generally feel mild, infrequent, and irregular at first. They get stronger, more frequent, and more regular as you move through this stage. You may have contractions about every 2-3 minutes. This stage ends when your cervix is completely dilated to 4 inches (10 cm) and completely effaced. Stage 2 This stage starts once your cervix is completely effaced and dilated and lasts until the delivery of your baby. This is the stage where you will feel an urge to push your baby out of your vagina. You may feel stretching and burning pain, especially when the widest part of your baby's head passes through the vaginal opening (crowning). Once your baby is delivered, the umbilical cord will be   clamped and cut. Timing of cutting the cord will depend on your wishes, your baby's health, and your health care provider's practices. Your baby  will be placed on your bare chest (skin-to-skin contact) in an upright position and covered with a warm blanket. If you are choosing to breastfeed, watch your baby for feeding cues, like rooting or sucking, and help the baby to your breast for his or her first feeding. Stage 3 This stage starts immediately after the birth of your baby and ends after you deliver the placenta. This stage may take anywhere from 5 to 30 minutes. After your baby has been delivered, you will feel contractions as your body expels the placenta. These contractions also help your uterus get smaller and reduce bleeding. What can I expect after labor and delivery? After labor is over, you and your baby will be assessed closely until you are ready to go home. Your health care team will teach you how to care for yourself and your baby. You and your baby may be encouraged to stay in the same room (rooming in) during your hospital stay. This will help promote early bonding and successful breastfeeding. Your uterus will be checked and massaged regularly (fundal massage). You may continue to receive fluids and medicines through an IV. You will have some soreness and pain in your abdomen, vagina, and the area of skin between your vaginal opening and your anus (perineum). If an incision was made near your vagina (episiotomy) or if you had some vaginal tearing during delivery, cold compresses may be placed on your episiotomy or your tear. This helps to reduce pain and swelling. It is normal to have vaginal bleeding after delivery. Wear a sanitary pad for vaginal bleeding and discharge. Summary Vaginal delivery means that you will give birth by pushing your baby out of your birth canal (vagina). Your health care team will monitor you and your baby throughout the stages of labor. After you deliver your baby, your health care team will continue to assess you and your baby to ensure you are both recovering as expected after delivery. This  information is not intended to replace advice given to you by your health care provider. Make sure you discuss any questions you have with your health care provider. Document Revised: 01/20/2020 Document Reviewed: 01/20/2020 Elsevier Patient Education  2023 Elsevier Inc.  

## 2021-09-08 ENCOUNTER — Encounter: Payer: Self-pay | Admitting: Emergency Medicine

## 2021-09-09 ENCOUNTER — Other Ambulatory Visit: Payer: Self-pay | Admitting: Advanced Practice Midwife

## 2021-09-09 ENCOUNTER — Other Ambulatory Visit: Payer: Self-pay

## 2021-09-09 ENCOUNTER — Encounter (HOSPITAL_COMMUNITY): Payer: Self-pay | Admitting: Obstetrics & Gynecology

## 2021-09-09 ENCOUNTER — Inpatient Hospital Stay (HOSPITAL_COMMUNITY)
Admission: AD | Admit: 2021-09-09 | Discharge: 2021-09-09 | Disposition: A | Discharge status: Home / Self Care | Payer: 59 | Attending: Obstetrics & Gynecology | Admitting: Obstetrics & Gynecology

## 2021-09-09 ENCOUNTER — Encounter: Payer: Self-pay | Admitting: Obstetrics and Gynecology

## 2021-09-09 ENCOUNTER — Inpatient Hospital Stay (HOSPITAL_BASED_OUTPATIENT_CLINIC_OR_DEPARTMENT_OTHER): Payer: 59

## 2021-09-09 ENCOUNTER — Ambulatory Visit (INDEPENDENT_AMBULATORY_CARE_PROVIDER_SITE_OTHER): Payer: 59 | Admitting: Obstetrics and Gynecology

## 2021-09-09 VITALS — BP 131/76 | HR 77 | Wt 344.0 lb

## 2021-09-09 DIAGNOSIS — Z3A38 38 weeks gestation of pregnancy: Secondary | ICD-10-CM

## 2021-09-09 DIAGNOSIS — Z79899 Other long term (current) drug therapy: Secondary | ICD-10-CM | POA: Insufficient documentation

## 2021-09-09 DIAGNOSIS — O99820 Streptococcus B carrier state complicating pregnancy: Secondary | ICD-10-CM

## 2021-09-09 DIAGNOSIS — O10913 Unspecified pre-existing hypertension complicating pregnancy, third trimester: Secondary | ICD-10-CM | POA: Insufficient documentation

## 2021-09-09 DIAGNOSIS — Z3483 Encounter for supervision of other normal pregnancy, third trimester: Secondary | ICD-10-CM | POA: Diagnosis not present

## 2021-09-09 DIAGNOSIS — Z6841 Body Mass Index (BMI) 40.0 and over, adult: Secondary | ICD-10-CM

## 2021-09-09 DIAGNOSIS — O09523 Supervision of elderly multigravida, third trimester: Secondary | ICD-10-CM

## 2021-09-09 DIAGNOSIS — O288 Other abnormal findings on antenatal screening of mother: Secondary | ICD-10-CM

## 2021-09-09 DIAGNOSIS — O99210 Obesity complicating pregnancy, unspecified trimester: Secondary | ICD-10-CM

## 2021-09-09 DIAGNOSIS — O10919 Unspecified pre-existing hypertension complicating pregnancy, unspecified trimester: Secondary | ICD-10-CM

## 2021-09-09 DIAGNOSIS — Z3689 Encounter for other specified antenatal screening: Secondary | ICD-10-CM | POA: Diagnosis not present

## 2021-09-09 DIAGNOSIS — Z3480 Encounter for supervision of other normal pregnancy, unspecified trimester: Secondary | ICD-10-CM

## 2021-09-09 NOTE — Progress Notes (Signed)
   PRENATAL VISIT NOTE  Subjective:  Lisa Charles is a 36 y.o. G3P2002 at [redacted]w[redacted]d being seen today for ongoing prenatal care.  She is currently monitored for the following issues for this high-risk pregnancy and has Chronic hypertension during pregnancy, antepartum; GBS (group B Streptococcus carrier), +RV culture, currently pregnant; Supervision of other normal pregnancy, antepartum; AMA (advanced maternal age) multigravida 35+; Unwanted fertility; Maternal obesity affecting pregnancy, antepartum; and BMI 50.0-59.9, adult (HCC) on their problem list.  Patient doing well with no acute concerns today. She reports no complaints.  Contractions: Not present. Vag. Bleeding: None.  Movement: Present. Denies leaking of fluid.   The following portions of the patient's history were reviewed and updated as appropriate: allergies, current medications, past family history, past medical history, past social history, past surgical history and problem list. Problem list updated.  Objective:   Vitals:   09/09/21 1513  BP: 131/76  Pulse: 77  Weight: (!) 344 lb (156 kg)    Fetal Status: Fetal Heart Rate (bpm): 134 Fundal Height: 40 cm Movement: Present     General:  Alert, oriented and cooperative. Patient is in no acute distress.  Skin: Skin is warm and dry. No rash noted.   Cardiovascular: Normal heart rate noted  Respiratory: Normal respiratory effort, no problems with respiration noted  Abdomen: Soft, gravid, appropriate for gestational age.  Pain/Pressure: Present     Pelvic: Cervical exam deferred        Extremities: Normal range of motion.  Edema: Trace  Mental Status:  Normal mood and affect. Normal behavior. Normal judgment and thought content.  Baseline 135, accels noted, no decels, category 1, reassuring but not reactive Assessment and Plan:  Pregnancy: G3P2002 at [redacted]w[redacted]d  1. Maternal obesity affecting pregnancy, antepartum   2. [redacted] weeks gestation of pregnancy   3. Chronic hypertension  during pregnancy, antepartum Good BP control, IOL scheduled for 09/14/21 NST reassuring but not reactive, category 1 strip, pt to MAU for extended monitoring and possible BPP  4. GBS (group B Streptococcus carrier), +RV culture, currently pregnant Treat in labor  5. Supervision of other normal pregnancy, antepartum Pt scheduled for IOL  6. BMI 50.0-59.9, adult (HCC)   7. Multigravida of advanced maternal age in third trimester   Term labor symptoms and general obstetric precautions including but not limited to vaginal bleeding, contractions, leaking of fluid and fetal movement were reviewed in detail with the patient.  Please refer to After Visit Summary for other counseling recommendations.   No follow-ups on file.   Mariel Aloe, MD Faculty Attending Center for [facility]

## 2021-09-09 NOTE — MAU Note (Signed)
.  Lisa Charles is a 36 y.o. at [redacted]w[redacted]d here in MAU reporting: sent from office for non reactive NST. Needs further monitoring for chronic HTN. Good fetal movement reported.denies any vag bleeding or leaking at this time  Onset of complaint: today Pain score: 3 There were no vitals filed for this visit.   FHT:130 Lab orders placed from triage:  none

## 2021-09-09 NOTE — MAU Provider Note (Signed)
History     CSN: [id number]  Arrival date and time: 09/09/21 1718   Event Date/Time   First Provider Initiated Contact with Patient 09/09/21 1749      Chief Complaint  Patient presents with   Non-stress Test   HPI Lisa Charles is a 36 y.o. G3P2002 at 34w2dwho presents to MAU for prolonged monitoring and possible [facility]PP [facility] She denies vaginal bleeding, leaking of fluid, decreased fetal movement, fever, falls, or recent illness.   Patient's pregnancy is c/b CHTN on Procardia XL 30 mg. Patient denies concerns related to her CHTN.  OB History     Gravida  3   Para  2   Term  2   Preterm  0   AB  0   Living  2      SAB  0   IAB  0   Ectopic  0   Multiple  0   Live Births  2           Past Medical History:  Diagnosis Date   Bronchitis    Hay fever    Pre-eclampsia     Past Surgical History:  Procedure Laterality Date   NO PAST SURGERIES      Family History  Problem Relation Age of Onset   Diabetes Mother    Hypertension Mother    Hyperlipidemia Mother    Gout Father    Diabetes Maternal Grandmother    Hypertension Paternal Grandmother    Diabetes Paternal Grandmother     Social History   Tobacco Use   Smoking status: Former    Types: Cigarettes    Quit date: 02/2016    Years since quitting: 5.5   Smokeless tobacco: Former  VScientific laboratory technicianUse: Never used  Substance Use Topics   Alcohol use: No   Drug use: No    Allergies:  Allergies  Allergen Reactions   Expectorant Cough Control [Guaifenesin] Other (See Comments)    Radiating pain around body and back. Pt states she can not take ay kind of expectorant.     Medications Prior to Admission  Medication Sig Dispense Refill Last Dose   aspirin EC 81 MG tablet Take 1 tablet (81 mg total) by mouth daily. Take after 12 weeks for prevention of preeclampsia later in pregnancy 300 tablet 2 09/09/2021   NIFEdipine (PROCARDIA-XL/NIFEDICAL-XL) 30 MG 24  hr tablet Take 1 tablet (30 mg total) by mouth daily. Can increase to twice a day as needed for symptomatic contractions 30 tablet 2 09/09/2021   Prenatal Vit-Fe Fumarate-FA (MULTIVITAMIN-PRENATAL) 27-0.8 MG TABS tablet Take 1 tablet by mouth daily at 12 noon.   09/09/2021   Blood Pressure Monitoring (BLOOD PRESSURE KIT) DEVI 1 kit by Does not apply route as needed. Large cuff 1 each 0    Misc. Devices (GOJJI WEIGHT SCALE) MISC 1 Device by Does not apply route as needed. 1 each 0     Review of Systems  All other systems reviewed and are negative.  Physical Exam   Blood pressure 139/70, pulse 86, temperature 98.6 F (37 C), resp. rate 18, last menstrual period 12/07/2020, currently breastfeeding.  Physical Exam Vitals and nursing note reviewed. Exam conducted with a chaperone present.  Constitutional:      Appearance: Normal appearance.  Cardiovascular:     Rate and Rhythm: Normal rate.     Pulses: Normal pulses.  Pulmonary:     Effort: Pulmonary effort is normal.  Abdominal:     Comments: Gravid  Neurological:     Mental Status: She is alert.     MAU Course  Procedures  MDM --Tracing reactive after PO intake of apple juice and potato chips --Baseline 125, mod var, + accels, no decels --Toco: quiet  Patient Vitals for the past 24 hrs:  BP Temp Pulse Resp  09/09/21 1910 (!) 141/66 -- 83 --  09/09/21 1737 139/70 98.6 F (37 C) 86 18    Assessment and Plan  --36 y.o. G3P2002 at [redacted]w[redacted]d --CHTN on Procardia and asymptomatic --Reactive tracing --BPP 8/8 --Discharge home in stable condition  F/U: Patient is scheduled for IOL on 09/14/2021  SDarlina Rumpf CNM 09/09/2021, 7:44 PM

## 2021-09-14 ENCOUNTER — Encounter (HOSPITAL_COMMUNITY): Payer: Self-pay | Admitting: Obstetrics and Gynecology

## 2021-09-14 ENCOUNTER — Other Ambulatory Visit: Payer: Self-pay

## 2021-09-14 ENCOUNTER — Inpatient Hospital Stay (HOSPITAL_COMMUNITY)
Admission: AD | Admit: 2021-09-14 | Discharge: 2021-09-17 | DRG: 785 | Disposition: A | Discharge status: Home / Self Care | Payer: 59 | Attending: Obstetrics & Gynecology | Admitting: Obstetrics & Gynecology

## 2021-09-14 ENCOUNTER — Inpatient Hospital Stay (HOSPITAL_COMMUNITY): Payer: 59

## 2021-09-14 DIAGNOSIS — Z3480 Encounter for supervision of other normal pregnancy, unspecified trimester: Secondary | ICD-10-CM

## 2021-09-14 DIAGNOSIS — O1092 Unspecified pre-existing hypertension complicating childbirth: Secondary | ICD-10-CM | POA: Diagnosis not present

## 2021-09-14 DIAGNOSIS — O99824 Streptococcus B carrier state complicating childbirth: Secondary | ICD-10-CM | POA: Diagnosis present

## 2021-09-14 DIAGNOSIS — O1002 Pre-existing essential hypertension complicating childbirth: Principal | ICD-10-CM | POA: Diagnosis present

## 2021-09-14 DIAGNOSIS — O99820 Streptococcus B carrier state complicating pregnancy: Secondary | ICD-10-CM | POA: Diagnosis not present

## 2021-09-14 DIAGNOSIS — O10919 Unspecified pre-existing hypertension complicating pregnancy, unspecified trimester: Secondary | ICD-10-CM | POA: Diagnosis present

## 2021-09-14 DIAGNOSIS — Z87891 Personal history of nicotine dependence: Secondary | ICD-10-CM | POA: Diagnosis not present

## 2021-09-14 DIAGNOSIS — Z302 Encounter for sterilization: Secondary | ICD-10-CM | POA: Diagnosis not present

## 2021-09-14 DIAGNOSIS — O99214 Obesity complicating childbirth: Secondary | ICD-10-CM | POA: Diagnosis present

## 2021-09-14 DIAGNOSIS — Z3A39 39 weeks gestation of pregnancy: Secondary | ICD-10-CM

## 2021-09-14 LAB — COMPREHENSIVE METABOLIC PANEL
ALT: 19 U/L (ref 0–44)
AST: 24 U/L (ref 15–41)
Albumin: 3.3 g/dL — ABNORMAL LOW (ref 3.5–5.0)
Alkaline Phosphatase: 151 U/L — ABNORMAL HIGH (ref 38–126)
Anion gap: 13 (ref 5–15)
BUN: 7 mg/dL (ref 6–20)
CO2: 19 mmol/L — ABNORMAL LOW (ref 22–32)
Calcium: 9.7 mg/dL (ref 8.9–10.3)
Chloride: 103 mmol/L (ref 98–111)
Creatinine, Ser: 0.55 mg/dL (ref 0.44–1.00)
GFR, Estimated: >60 mL/min (ref >60)
Glucose, Bld: 86 mg/dL (ref 70–99)
Potassium: 3.7 mmol/L (ref 3.5–5.1)
Sodium: 135 mmol/L (ref 135–145)
Total Bilirubin: 0.5 mg/dL (ref 0.3–1.2)
Total Protein: 7.3 g/dL (ref 6.5–8.1)

## 2021-09-14 LAB — CBC
HCT: 34.1 % — ABNORMAL LOW (ref 36.0–46.0)
Hemoglobin: 10.9 g/dL — ABNORMAL LOW (ref 12.0–15.0)
MCH: 24.8 pg — ABNORMAL LOW (ref 26.0–34.0)
MCHC: 32 g/dL (ref 30.0–36.0)
MCV: 77.7 fL — ABNORMAL LOW (ref 80.0–100.0)
Platelets: 234 10*3/uL (ref 150–400)
RBC: 4.39 MIL/uL (ref 3.87–5.11)
RDW: 16.7 % — ABNORMAL HIGH (ref 11.5–15.5)
WBC: 9.3 10*3/uL (ref 4.0–10.5)
nRBC: 0 % (ref 0.0–0.2)

## 2021-09-14 LAB — TYPE AND SCREEN
ABO/RH(D): A POS
Antibody Screen: NEGATIVE

## 2021-09-14 LAB — PROTEIN / CREATININE RATIO, URINE
Creatinine, Urine: 115 mg/dL
Protein Creatinine Ratio: 0.1 mg/mg{Cre} (ref 0.00–0.15)
Total Protein, Urine: 11 mg/dL

## 2021-09-14 MED ORDER — LACTATED RINGERS IV SOLN: INTRAVENOUS | Status: DC

## 2021-09-14 MED ORDER — PENICILLIN G POT IN DEXTROSE 60000 UNIT/ML IV SOLN
3.0000 10*6.[IU] | INTRAVENOUS | Status: DC
  Administered 2021-09-15 (×5): 3 10*6.[IU] via INTRAVENOUS
  Filled 2021-09-14 (×5): qty 50

## 2021-09-14 MED ORDER — OXYCODONE-ACETAMINOPHEN 5-325 MG PO TABS: 2.0000 | ORAL_TABLET | ORAL | Status: DC | PRN

## 2021-09-14 MED ORDER — OXYTOCIN-SODIUM CHLORIDE 30-0.9 UT/500ML-% IV SOLN
1.0000 m[IU]/min | INTRAVENOUS | Status: DC
  Administered 2021-09-15: 2 m[IU]/min via INTRAVENOUS
  Filled 2021-09-14: qty 500

## 2021-09-14 MED ORDER — LIDOCAINE HCL (PF) 1 % IJ SOLN: 30.0000 mL | INTRAMUSCULAR | Status: DC | PRN

## 2021-09-14 MED ORDER — OXYTOCIN-SODIUM CHLORIDE 30-0.9 UT/500ML-% IV SOLN: 2.5000 [IU]/h | INTRAVENOUS | Status: DC

## 2021-09-14 MED ORDER — ONDANSETRON HCL 4 MG/2ML IJ SOLN
4.0000 mg | Freq: Four times a day (QID) | INTRAMUSCULAR | Status: DC | PRN
  Administered 2021-09-15: 4 mg via INTRAVENOUS
  Filled 2021-09-14: qty 2

## 2021-09-14 MED ORDER — FENTANYL-BUPIVACAINE-NACL 0.5-0.125-0.9 MG/250ML-% EP SOLN
12.0000 mL/h | EPIDURAL | Status: DC | PRN
  Administered 2021-09-15: 12.5 mL/h via EPIDURAL
  Filled 2021-09-14: qty 250

## 2021-09-14 MED ORDER — SODIUM CHLORIDE 0.9 % IV SOLN
5.0000 10*6.[IU] | Freq: Once | INTRAVENOUS | Status: AC
  Administered 2021-09-14: 5 10*6.[IU] via INTRAVENOUS
  Filled 2021-09-14: qty 5

## 2021-09-14 MED ORDER — LACTATED RINGERS IV SOLN: 500.0000 mL | INTRAVENOUS | Status: DC | PRN

## 2021-09-14 MED ORDER — EPHEDRINE 5 MG/ML INJ: 10.0000 mg | INTRAVENOUS | Status: DC | PRN

## 2021-09-14 MED ORDER — LACTATED RINGERS IV SOLN: 500.0000 mL | Freq: Once | INTRAVENOUS | Status: DC

## 2021-09-14 MED ORDER — DIPHENHYDRAMINE HCL 50 MG/ML IJ SOLN: 12.5000 mg | INTRAMUSCULAR | Status: DC | PRN

## 2021-09-14 MED ORDER — TERBUTALINE SULFATE 1 MG/ML IJ SOLN
0.2500 mg | Freq: Once | INTRAMUSCULAR | Status: AC | PRN
  Administered 2021-09-15: 0.25 mg via SUBCUTANEOUS
  Filled 2021-09-14: qty 1

## 2021-09-14 MED ORDER — OXYTOCIN BOLUS FROM INFUSION: 333.0000 mL | Freq: Once | INTRAVENOUS | Status: DC

## 2021-09-14 MED ORDER — OXYCODONE-ACETAMINOPHEN 5-325 MG PO TABS: 1.0000 | ORAL_TABLET | ORAL | Status: DC | PRN

## 2021-09-14 MED ORDER — MISOPROSTOL 50MCG HALF TABLET
50.0000 ug | ORAL_TABLET | ORAL | Status: DC | PRN
  Administered 2021-09-14 (×2): 50 ug via BUCCAL
  Filled 2021-09-14 (×2): qty 1

## 2021-09-14 MED ORDER — ACETAMINOPHEN 325 MG PO TABS: 650.0000 mg | ORAL_TABLET | ORAL | Status: DC | PRN

## 2021-09-14 MED ORDER — PHENYLEPHRINE 80 MCG/ML (10ML) SYRINGE FOR IV PUSH (FOR BLOOD PRESSURE SUPPORT): 80.0000 ug | PREFILLED_SYRINGE | INTRAVENOUS | Status: DC | PRN

## 2021-09-14 MED ORDER — FENTANYL CITRATE (PF) 100 MCG/2ML IJ SOLN: 50.0000 ug | INTRAMUSCULAR | Status: DC | PRN

## 2021-09-14 MED ORDER — NIFEDIPINE ER OSMOTIC RELEASE 30 MG PO TB24
30.0000 mg | ORAL_TABLET | Freq: Every day | ORAL | Status: DC
  Administered 2021-09-15: 30 mg via ORAL
  Filled 2021-09-14 (×2): qty 1

## 2021-09-14 MED ORDER — SOD CITRATE-CITRIC ACID 500-334 MG/5ML PO SOLN
30.0000 mL | ORAL | Status: DC | PRN
  Administered 2021-09-15: 30 mL via ORAL
  Filled 2021-09-14: qty 30

## 2021-09-14 NOTE — Progress Notes (Shared)
Labor Progress Note Lisa Charles is a 35 y.o. G3P2002 at [redacted]w[redacted]d presented for *** S:   O:  BP 127/60   Pulse 88   Temp 97.9 F (36.6 C) (Oral)   Resp 19   Ht 5\' 10"  (1.778 m)   Wt (!) 155.7 kg   LMP 12/07/2020 (Approximate)   BMI 49.26 kg/m  EFM: ***/***/***  CVE: Dilation: Closed (outer oss 1.5 cm) Effacement (%): Thick Cervical Position: Posterior Station: -2 Presentation: Vertex Exam by:: Dr. [email or web address]   A&P: 36 y.o. 31 [redacted]w[redacted]d *** #Labor: Progressing well. *** #Pain:  #FWB: *** #GBS {pos/neg/not [redacted]w[redacted]d *** (chronic/other problems)  EXMD:[id number]}, MD 11:59 PM

## 2021-09-14 NOTE — Progress Notes (Signed)
Labor Progress Note Lisa Charles is a 36 y.o. G3P2002 at [redacted]w[redacted]d presented for IOL due to cHTN on medications  Went to meet patient and introduce myself.  S:  Doing well. Feels a little cramping but not too bad. Went to meet patient and introduce myself.  O:  BP 123/62   Pulse 79   Temp 97.9 F (36.6 C) (Oral)   Resp 17   Ht 5\' 10"  (1.778 m)   Wt (!) 155.7 kg   LMP 12/07/2020 (Approximate)   BMI 49.26 kg/m  EFM: 130/moderate/+accels/no decels  CVE: Dilation: Fingertip (outer oss 1 cm) Effacement (%): Thick Cervical Position: Posterior Station: -3 Presentation: Vertex Exam by:: [email or web address], RN   A&P: 36 y.o. 31 [redacted]w[redacted]d  at [redacted]w[redacted]d presented for IOL due to cHTN on medications #Labor: S/p Cytotec x1 at 44. Next check due 2330 and will assess for FB at that time #FWB: Cat I #GBS positive>PCN  #cHTN On procardia 30mg  XL. Patient reports did take Procardia today. BP here normotensive to mild range in 120s-130s. Will continue to monitor and Procardia dose ordered for AM  2331, MD, MPH OB Fellow, Faculty Practice

## 2021-09-14 NOTE — H&P (Addendum)
Lisa Charles is a 36 y.o. female G3P2002 at [redacted]w[redacted]d by LMP and U/S, presenting for IOL due to [facility]. She is currently on procardia XL 30 mg. BP at presentation stable 132/63.  Other problems include GBS, +RV culture, BMI 50-59.9. She reports no other problems with her previous pregnancies. She has been induced before and is open to augmentation.   OB History     Gravida  3   Para  2   Term  2   Preterm  0   AB  0   Living  2      SAB  0   IAB  0   Ectopic  0   Multiple  0   Live Births  2          Past Medical History:  Diagnosis Date   Bronchitis    Hay fever    Pre-eclampsia    Past Surgical History:  Procedure Laterality Date   NO PAST SURGERIES     Family History: family history includes Diabetes in her maternal grandmother, mother, and paternal grandmother; Gout in her father; Hyperlipidemia in her mother; Hypertension in her mother and paternal grandmother. Social History:  reports that she quit smoking about 5 years ago. Her smoking use included cigarettes. She has quit using smokeless tobacco. She reports that she does not drink alcohol and does not use drugs.    Nursing Staff Provider  Office Location [facility] Dating  LMP  [facility] Model [ X] Traditional [ ]  Centering [ ]  Mom-Baby Dyad    Language  English Anatomy   normal; has follow up for growth and BPPs  Flu Vaccine  Given 04/14/21 Genetic/Carrier Screen  NIPS:  LR female   AFP:   negative Horizon:  TDaP Vaccine   Given 4/27 Hgb A1C or  GTT Early  Third trimester nl 2 hour  COVID Vaccine Completed   LAB RESULTS   Rhogam  NA Blood Type A/Positive/-- (02/08 1557) A positive  Baby Feeding Plan Breast/Bottle Antibody Negative (02/08 1557)negative  Contraception BTL Rubella 7.31 (02/08 1557)immune  Circumcision Yes if boy RPR Non Reactive (04/27 0946) non reactive  Pediatrician  [facility] HBsAg Negative (02/08 1557) negative  Support Person mom HCVAb  negative  Prenatal Classes  HIV  Non Reactive (04/27 0946)   negative  BTL Consent 06/10/2021 GBS    positive    VBAC Consent  Pap  2/23 normal       DME Rx [x ] BP cuff [x ] Weight Scale Waterbirth  [ ]  Class [ ]  Consent [ ]  CNM visit  PHQ9 & GAD7 [ x ] new OB [ x ] 28 weeks  [x  ] 36 weeks Induction  [ ]  Orders Entered [ ] Foley Y/N    Review of Systems  All other systems reviewed and are negative.  Maternal Medical History:  Reason for admission: Induction   Prenatal complications: PIH.   Prenatal Complications - Diabetes: none.     Blood pressure 132/63, pulse 81, temperature 97.9 F (36.6 C), temperature source Oral, resp. rate 18, height 5\' 10"  (1.778 m), weight (!) 155.7 kg, last menstrual period 12/07/2020, currently breastfeeding. Maternal Exam:  Uterine Assessment: Contraction frequency is irregular.  Abdomen: Patient reports no abdominal tenderness. Introitus: not evaluated.   Cervix: not evaluated.   Physical Exam Constitutional:      Appearance: Normal appearance.  Pulmonary:     Effort: Pulmonary effort is normal.  Abdominal:  Tenderness: There is no abdominal tenderness.  Neurological:     General: No focal deficit present.     Mental Status: She is alert and oriented to person, place, and time. Mental status is at baseline.     Prenatal labs: ABO, Rh: --/--/PENDING (07/11 1700) Antibody: PENDING (07/11 1700) Rubella: 7.31 (02/08 1557) RPR: Non Reactive (04/27 0946)  HBsAg: Negative (02/08 1557)  HIV: Non Reactive (04/27 0946)  GBS: Positive/-- (06/22 1633)   Assessment/Plan:  Labor: cytotec with ambulation, patient amendable to further augmentation when appropriate.  Pain: unsure, open to IV pain medication, would like to try and labor without the epidural. Has had epidurals with other two deliveries.   Planning to breastfeed  BTL paperwork signed     Glendale Chard 09/14/2021, 5:47 PM   Attestation of Supervision of Resident:  I confirm that I have verified the  information documented in the  resident's  note and that I have also personally reperformed the history, physical exam and all medical decision making activities.  I have verified that all services and findings are accurately documented in this student's note; and I agree with management and plan as outlined in the documentation. I have also made any necessary editorial changes.  Thressa Sheller DNP, CNM  09/14/21  7:04 PM

## 2021-09-15 ENCOUNTER — Inpatient Hospital Stay (HOSPITAL_COMMUNITY): Payer: 59 | Admitting: Anesthesiology

## 2021-09-15 ENCOUNTER — Encounter (HOSPITAL_COMMUNITY)
Admission: AD | Disposition: A | Discharge status: Home / Self Care | Payer: Self-pay | Source: Home / Self Care | Attending: Obstetrics & Gynecology

## 2021-09-15 ENCOUNTER — Other Ambulatory Visit: Payer: Self-pay

## 2021-09-15 ENCOUNTER — Encounter (HOSPITAL_COMMUNITY): Payer: Self-pay | Admitting: Obstetrics & Gynecology

## 2021-09-15 DIAGNOSIS — Z302 Encounter for sterilization: Secondary | ICD-10-CM

## 2021-09-15 DIAGNOSIS — Z3A39 39 weeks gestation of pregnancy: Secondary | ICD-10-CM

## 2021-09-15 DIAGNOSIS — O1092 Unspecified pre-existing hypertension complicating childbirth: Secondary | ICD-10-CM

## 2021-09-15 DIAGNOSIS — O1002 Pre-existing essential hypertension complicating childbirth: Secondary | ICD-10-CM

## 2021-09-15 DIAGNOSIS — O99820 Streptococcus B carrier state complicating pregnancy: Secondary | ICD-10-CM

## 2021-09-15 DIAGNOSIS — O99214 Obesity complicating childbirth: Secondary | ICD-10-CM

## 2021-09-15 LAB — CBC
HCT: 31.7 % — ABNORMAL LOW (ref 36.0–46.0)
HCT: 32.8 % — ABNORMAL LOW (ref 36.0–46.0)
Hemoglobin: 10.2 g/dL — ABNORMAL LOW (ref 12.0–15.0)
Hemoglobin: 10.3 g/dL — ABNORMAL LOW (ref 12.0–15.0)
MCH: 25.1 pg — ABNORMAL LOW (ref 26.0–34.0)
MCH: 24.9 pg — ABNORMAL LOW (ref 26.0–34.0)
MCHC: 32.2 g/dL (ref 30.0–36.0)
MCHC: 31.4 g/dL (ref 30.0–36.0)
MCV: 77.9 fL — ABNORMAL LOW (ref 80.0–100.0)
MCV: 79.4 fL — ABNORMAL LOW (ref 80.0–100.0)
Platelets: 236 10*3/uL (ref 150–400)
Platelets: 243 10*3/uL (ref 150–400)
RBC: 4.07 MIL/uL (ref 3.87–5.11)
RBC: 4.13 MIL/uL (ref 3.87–5.11)
RDW: 17 % — ABNORMAL HIGH (ref 11.5–15.5)
RDW: 16.9 % — ABNORMAL HIGH (ref 11.5–15.5)
WBC: 10.5 10*3/uL (ref 4.0–10.5)
WBC: 21.8 10*3/uL — ABNORMAL HIGH (ref 4.0–10.5)
nRBC: 0 % (ref 0.0–0.2)
nRBC: 0 % (ref 0.0–0.2)

## 2021-09-15 LAB — RPR: RPR Ser Ql: NONREACTIVE

## 2021-09-15 SURGERY — Surgical Case
Anesthesia: Epidural

## 2021-09-15 MED ORDER — EPHEDRINE 5 MG/ML INJ: 10.0000 mg | INTRAVENOUS | Status: DC | PRN

## 2021-09-15 MED ORDER — OXYCODONE HCL 5 MG PO TABS: 5.0000 mg | ORAL_TABLET | ORAL | Status: DC | PRN

## 2021-09-15 MED ORDER — CEFAZOLIN IN SODIUM CHLORIDE 3-0.9 GM/100ML-% IV SOLN
INTRAVENOUS | Status: AC
  Filled 2021-09-15: qty 100

## 2021-09-15 MED ORDER — OXYTOCIN-SODIUM CHLORIDE 30-0.9 UT/500ML-% IV SOLN: 2.5000 [IU]/h | INTRAVENOUS | Status: AC

## 2021-09-15 MED ORDER — KETOROLAC TROMETHAMINE 30 MG/ML IJ SOLN: 30.0000 mg | Freq: Once | INTRAMUSCULAR | Status: DC | PRN

## 2021-09-15 MED ORDER — MEPERIDINE HCL 25 MG/ML IJ SOLN: 6.2500 mg | INTRAMUSCULAR | Status: DC | PRN

## 2021-09-15 MED ORDER — DIPHENHYDRAMINE HCL 50 MG/ML IJ SOLN: 12.5000 mg | INTRAMUSCULAR | Status: DC | PRN

## 2021-09-15 MED ORDER — NALOXONE HCL 0.4 MG/ML IJ SOLN: 0.4000 mg | INTRAMUSCULAR | Status: DC | PRN

## 2021-09-15 MED ORDER — FENTANYL CITRATE (PF) 100 MCG/2ML IJ SOLN
INTRAMUSCULAR | Status: AC
  Filled 2021-09-15: qty 2

## 2021-09-15 MED ORDER — ACETAMINOPHEN 500 MG PO TABS
1000.0000 mg | ORAL_TABLET | Freq: Four times a day (QID) | ORAL | Status: AC
  Administered 2021-09-15 – 2021-09-16 (×4): 1000 mg via ORAL
  Filled 2021-09-15 (×4): qty 2

## 2021-09-15 MED ORDER — LACTATED RINGERS IV SOLN: 500.0000 mL | Freq: Once | INTRAVENOUS | Status: DC

## 2021-09-15 MED ORDER — SENNOSIDES-DOCUSATE SODIUM 8.6-50 MG PO TABS
2.0000 | ORAL_TABLET | Freq: Every day | ORAL | Status: DC
  Administered 2021-09-16 – 2021-09-17 (×2): 2 via ORAL
  Filled 2021-09-15 (×2): qty 2

## 2021-09-15 MED ORDER — FENTANYL CITRATE (PF) 100 MCG/2ML IJ SOLN
INTRAMUSCULAR | Status: DC | PRN
Start: 2021-09-15 — End: 2021-09-15
  Administered 2021-09-15: 100 ug via EPIDURAL

## 2021-09-15 MED ORDER — TRANEXAMIC ACID-NACL 1000-0.7 MG/100ML-% IV SOLN
INTRAVENOUS | Status: DC | PRN
  Administered 2021-09-15: 1000 mg via INTRAVENOUS

## 2021-09-15 MED ORDER — OXYCODONE HCL 5 MG PO TABS: 5.0000 mg | ORAL_TABLET | Freq: Four times a day (QID) | ORAL | Status: DC | PRN

## 2021-09-15 MED ORDER — HYDROMORPHONE HCL 1 MG/ML IJ SOLN: 0.2500 mg | INTRAMUSCULAR | Status: DC | PRN

## 2021-09-15 MED ORDER — CEFAZOLIN SODIUM-DEXTROSE 2-3 GM-%(50ML) IV SOLR
INTRAVENOUS | Status: DC | PRN
  Administered 2021-09-15: 3 g via INTRAVENOUS

## 2021-09-15 MED ORDER — SIMETHICONE 80 MG PO CHEW
80.0000 mg | CHEWABLE_TABLET | Freq: Three times a day (TID) | ORAL | Status: DC
  Administered 2021-09-16 – 2021-09-17 (×4): 80 mg via ORAL
  Filled 2021-09-15 (×4): qty 1

## 2021-09-15 MED ORDER — SODIUM CHLORIDE 0.9 % IV SOLN
INTRAVENOUS | Status: AC
  Filled 2021-09-15: qty 5

## 2021-09-15 MED ORDER — MORPHINE SULFATE (PF) 0.5 MG/ML IJ SOLN
INTRAMUSCULAR | Status: DC | PRN
  Administered 2021-09-15: 3 mg via EPIDURAL

## 2021-09-15 MED ORDER — KETOROLAC TROMETHAMINE 30 MG/ML IJ SOLN: 30.0000 mg | Freq: Four times a day (QID) | INTRAMUSCULAR | Status: AC | PRN

## 2021-09-15 MED ORDER — LACTATED RINGERS AMNIOINFUSION: INTRAVENOUS | Status: DC

## 2021-09-15 MED ORDER — FENTANYL-BUPIVACAINE-NACL 0.5-0.125-0.9 MG/250ML-% EP SOLN: 12.0000 mL/h | EPIDURAL | Status: DC | PRN

## 2021-09-15 MED ORDER — SCOPOLAMINE 1 MG/3DAYS TD PT72
MEDICATED_PATCH | TRANSDERMAL | Status: AC
  Filled 2021-09-15: qty 1

## 2021-09-15 MED ORDER — IBUPROFEN 600 MG PO TABS
600.0000 mg | ORAL_TABLET | Freq: Four times a day (QID) | ORAL | Status: DC
  Administered 2021-09-17 (×3): 600 mg via ORAL
  Filled 2021-09-15 (×3): qty 1

## 2021-09-15 MED ORDER — ONDANSETRON HCL 4 MG/2ML IJ SOLN
INTRAMUSCULAR | Status: DC | PRN
  Administered 2021-09-15: 4 mg via INTRAVENOUS

## 2021-09-15 MED ORDER — DEXAMETHASONE SODIUM PHOSPHATE 10 MG/ML IJ SOLN
INTRAMUSCULAR | Status: DC | PRN
  Administered 2021-09-15: 10 mg via INTRAVENOUS

## 2021-09-15 MED ORDER — PHENYLEPHRINE 80 MCG/ML (10ML) SYRINGE FOR IV PUSH (FOR BLOOD PRESSURE SUPPORT)
PREFILLED_SYRINGE | INTRAVENOUS | Status: AC
  Filled 2021-09-15: qty 10

## 2021-09-15 MED ORDER — SODIUM BICARBONATE 8.4 % IV SOLN
INTRAVENOUS | Status: DC | PRN
  Administered 2021-09-15: 10 mL via EPIDURAL
  Administered 2021-09-15: 5 mL via EPIDURAL

## 2021-09-15 MED ORDER — PHENYLEPHRINE 80 MCG/ML (10ML) SYRINGE FOR IV PUSH (FOR BLOOD PRESSURE SUPPORT): 80.0000 ug | PREFILLED_SYRINGE | INTRAVENOUS | Status: DC | PRN

## 2021-09-15 MED ORDER — KETOROLAC TROMETHAMINE 30 MG/ML IJ SOLN
30.0000 mg | Freq: Four times a day (QID) | INTRAMUSCULAR | Status: AC
  Administered 2021-09-16 (×3): 30 mg via INTRAVENOUS
  Filled 2021-09-15 (×3): qty 1

## 2021-09-15 MED ORDER — KETOROLAC TROMETHAMINE 30 MG/ML IJ SOLN
30.0000 mg | Freq: Four times a day (QID) | INTRAMUSCULAR | Status: AC | PRN
  Administered 2021-09-15: 30 mg via INTRAMUSCULAR

## 2021-09-15 MED ORDER — OXYTOCIN-SODIUM CHLORIDE 30-0.9 UT/500ML-% IV SOLN
INTRAVENOUS | Status: DC | PRN
  Administered 2021-09-15: 30 [IU] via INTRAVENOUS

## 2021-09-15 MED ORDER — ONDANSETRON HCL 4 MG/2ML IJ SOLN: 4.0000 mg | Freq: Three times a day (TID) | INTRAMUSCULAR | Status: DC | PRN

## 2021-09-15 MED ORDER — TRANEXAMIC ACID-NACL 1000-0.7 MG/100ML-% IV SOLN
INTRAVENOUS | Status: AC
  Filled 2021-09-15: qty 100

## 2021-09-15 MED ORDER — LIDOCAINE HCL (PF) 1 % IJ SOLN
INTRAMUSCULAR | Status: DC | PRN
  Administered 2021-09-15 (×2): 6 mL via EPIDURAL

## 2021-09-15 MED ORDER — LACTATED RINGERS IV SOLN: INTRAVENOUS | Status: DC

## 2021-09-15 MED ORDER — MEASLES, MUMPS & RUBELLA VAC IJ SOLR: 0.5000 mL | Freq: Once | INTRAMUSCULAR | Status: DC

## 2021-09-15 MED ORDER — SIMETHICONE 80 MG PO CHEW: 80.0000 mg | CHEWABLE_TABLET | ORAL | Status: DC | PRN

## 2021-09-15 MED ORDER — ACETAMINOPHEN 325 MG PO TABS: 650.0000 mg | ORAL_TABLET | ORAL | Status: DC | PRN

## 2021-09-15 MED ORDER — DEXAMETHASONE SODIUM PHOSPHATE 10 MG/ML IJ SOLN
INTRAMUSCULAR | Status: AC
  Filled 2021-09-15: qty 1

## 2021-09-15 MED ORDER — ENOXAPARIN SODIUM 80 MG/0.8ML IJ SOSY
80.0000 mg | PREFILLED_SYRINGE | INTRAMUSCULAR | Status: DC
  Administered 2021-09-16: 80 mg via SUBCUTANEOUS
  Filled 2021-09-15 (×2): qty 0.8

## 2021-09-15 MED ORDER — ONDANSETRON HCL 4 MG/2ML IJ SOLN
INTRAMUSCULAR | Status: AC
  Filled 2021-09-15: qty 2

## 2021-09-15 MED ORDER — ERYTHROMYCIN 5 MG/GM OP OINT
TOPICAL_OINTMENT | OPHTHALMIC | Status: AC
  Filled 2021-09-15: qty 1

## 2021-09-15 MED ORDER — ONDANSETRON HCL 4 MG/2ML IJ SOLN: 4.0000 mg | Freq: Once | INTRAMUSCULAR | Status: DC | PRN

## 2021-09-15 MED ORDER — PRENATAL MULTIVITAMIN CH
1.0000 | ORAL_TABLET | Freq: Every day | ORAL | Status: DC
  Administered 2021-09-17: 1 via ORAL
  Filled 2021-09-15: qty 1

## 2021-09-15 MED ORDER — SODIUM CHLORIDE 0.9 % IV SOLN
INTRAVENOUS | Status: DC | PRN
  Administered 2021-09-15: 500 mg via INTRAVENOUS

## 2021-09-15 MED ORDER — NALOXONE HCL 4 MG/10ML IJ SOLN: 1.0000 ug/kg/h | INTRAVENOUS | Status: DC | PRN

## 2021-09-15 MED ORDER — LIDOCAINE-EPINEPHRINE (PF) 2 %-1:200000 IJ SOLN
INTRAMUSCULAR | Status: AC
  Filled 2021-09-15: qty 20

## 2021-09-15 MED ORDER — SCOPOLAMINE 1 MG/3DAYS TD PT72
1.0000 | MEDICATED_PATCH | Freq: Once | TRANSDERMAL | Status: DC
  Administered 2021-09-15: 1.5 mg via TRANSDERMAL

## 2021-09-15 MED ORDER — PHENYLEPHRINE 80 MCG/ML (10ML) SYRINGE FOR IV PUSH (FOR BLOOD PRESSURE SUPPORT)
PREFILLED_SYRINGE | INTRAVENOUS | Status: DC | PRN
  Administered 2021-09-15: 240 ug via INTRAVENOUS
  Administered 2021-09-15 (×3): 160 ug via INTRAVENOUS

## 2021-09-15 MED ORDER — COCONUT OIL OIL
1.0000 | TOPICAL_OIL | Status: DC | PRN
Start: 2021-09-15 — End: 2021-09-17

## 2021-09-15 MED ORDER — LACTATED RINGERS IV SOLN: INTRAVENOUS | Status: DC | PRN

## 2021-09-15 MED ORDER — DIPHENHYDRAMINE HCL 25 MG PO CAPS: 25.0000 mg | ORAL_CAPSULE | ORAL | Status: DC | PRN

## 2021-09-15 MED ORDER — DIBUCAINE (PERIANAL) 1 % EX OINT: 1.0000 | TOPICAL_OINTMENT | CUTANEOUS | Status: DC | PRN

## 2021-09-15 MED ORDER — MENTHOL 3 MG MT LOZG: 1.0000 | LOZENGE | OROMUCOSAL | Status: DC | PRN

## 2021-09-15 MED ORDER — WITCH HAZEL-GLYCERIN EX PADS: 1.0000 | MEDICATED_PAD | CUTANEOUS | Status: DC | PRN

## 2021-09-15 MED ORDER — DIPHENHYDRAMINE HCL 25 MG PO CAPS: 25.0000 mg | ORAL_CAPSULE | Freq: Four times a day (QID) | ORAL | Status: DC | PRN

## 2021-09-15 MED ORDER — MORPHINE SULFATE (PF) 0.5 MG/ML IJ SOLN
INTRAMUSCULAR | Status: AC
  Filled 2021-09-15: qty 10

## 2021-09-15 MED ORDER — SODIUM CHLORIDE 0.9% FLUSH: 3.0000 mL | INTRAVENOUS | Status: DC | PRN

## 2021-09-15 MED ORDER — KETOROLAC TROMETHAMINE 30 MG/ML IJ SOLN
INTRAMUSCULAR | Status: AC
  Filled 2021-09-15: qty 1

## 2021-09-15 MED ORDER — TETANUS-DIPHTH-ACELL PERTUSSIS 5-2.5-18.5 LF-MCG/0.5 IM SUSY: 0.5000 mL | PREFILLED_SYRINGE | Freq: Once | INTRAMUSCULAR | Status: DC

## 2021-09-15 MED ORDER — VITAMIN K1 1 MG/0.5ML IJ SOLN
INTRAMUSCULAR | Status: AC
  Filled 2021-09-15: qty 0.5

## 2021-09-15 SURGICAL SUPPLY — 37 items
ADH SKN CLS APL DERMABOND .7 (GAUZE/BANDAGES/DRESSINGS)
APL SKNCLS STERI-STRIP NONHPOA (GAUZE/BANDAGES/DRESSINGS) ×1
BENZOIN TINCTURE PRP APPL 2/3 (GAUZE/BANDAGES/DRESSINGS) ×2 IMPLANT
CHLORAPREP W/TINT 26ML (MISCELLANEOUS) ×4 IMPLANT
CLAMP CORD UMBIL (MISCELLANEOUS) ×2 IMPLANT
CLOTH BEACON ORANGE TIMEOUT ST (SAFETY) ×2 IMPLANT
DERMABOND ADVANCED (GAUZE/BANDAGES/DRESSINGS)
DERMABOND ADVANCED .7 DNX12 (GAUZE/BANDAGES/DRESSINGS) IMPLANT
DRSG OPSITE POSTOP 4X10 (GAUZE/BANDAGES/DRESSINGS) ×2 IMPLANT
ELECT REM PT RETURN 9FT ADLT (ELECTROSURGICAL) ×2
ELECTRODE REM PT RTRN 9FT ADLT (ELECTROSURGICAL) ×1 IMPLANT
EXTRACTOR VACUUM KIWI (MISCELLANEOUS) IMPLANT
GLOVE BIOGEL PI IND STRL 7.0 (GLOVE) ×3 IMPLANT
GLOVE BIOGEL PI INDICATOR 7.0 (GLOVE) ×3
GLOVE ECLIPSE 6.5 STRL STRAW (GLOVE) ×2 IMPLANT
GOWN STRL REUS W/TWL LRG LVL3 (GOWN DISPOSABLE) ×6 IMPLANT
KIT ABG SYR 3ML LUER SLIP (SYRINGE) IMPLANT
NDL HYPO 25X5/8 SAFETYGLIDE (NEEDLE) IMPLANT
NEEDLE HYPO 25X5/8 SAFETYGLIDE (NEEDLE) IMPLANT
NS IRRIG 1000ML POUR BTL (IV SOLUTION) ×2 IMPLANT
PACK C SECTION WH (CUSTOM PROCEDURE TRAY) ×2 IMPLANT
PAD ABD 7.5X8 STRL (GAUZE/BANDAGES/DRESSINGS) ×2 IMPLANT
PAD OB MATERNITY 4.3X12.25 (PERSONAL CARE ITEMS) ×2 IMPLANT
RTRCTR C-SECT PINK 25CM LRG (MISCELLANEOUS) ×2 IMPLANT
STRIP CLOSURE SKIN 1/2X4 (GAUZE/BANDAGES/DRESSINGS) ×2 IMPLANT
SUT PLAIN 0 NONE (SUTURE) IMPLANT
SUT PLAIN 2 0 XLH (SUTURE) IMPLANT
SUT VIC AB 0 CT1 27 (SUTURE) ×4
SUT VIC AB 0 CT1 27XBRD ANBCTR (SUTURE) ×2 IMPLANT
SUT VIC AB 0 CTX 36 (SUTURE) ×6
SUT VIC AB 0 CTX36XBRD ANBCTRL (SUTURE) ×3 IMPLANT
SUT VIC AB 2-0 CT1 27 (SUTURE) ×2
SUT VIC AB 2-0 CT1 TAPERPNT 27 (SUTURE) ×1 IMPLANT
SUT VIC AB 4-0 KS 27 (SUTURE) ×2 IMPLANT
TOWEL OR 17X24 6PK STRL BLUE (TOWEL DISPOSABLE) ×2 IMPLANT
TRAY FOLEY W/BAG SLVR 14FR LF (SET/KITS/TRAYS/PACK) IMPLANT
WATER STERILE IRR 1000ML POUR (IV SOLUTION) ×2 IMPLANT

## 2021-09-15 NOTE — Progress Notes (Signed)
Called to bedside for decels Deep variable decels into 50s with prolonged x3 min Pitocin off Pt repositioned to left side IVF bolus started FSE placed by me Return to baseline of 130s then with recurrent variables again Pt repositioned to h&k Amnioinfusion bolus running Dr. Debroah Loop updated and at bedside

## 2021-09-15 NOTE — Anesthesia Procedure Notes (Signed)
Epidural Patient location during procedure: OB Start time: 09/15/2021 9:14 AM End time: 09/15/2021 9:24 AM  Staffing Anesthesiologist: Mal Amabile, MD Performed: anesthesiologist   Preanesthetic Checklist Completed: patient identified, IV checked, site marked, risks and benefits discussed, surgical consent, monitors and equipment checked, pre-op evaluation and timeout performed  Epidural Patient position: sitting Prep: DuraPrep and site prepped and draped Patient monitoring: continuous pulse ox and blood pressure Approach: midline Location: L3-L4 Injection technique: LOR air  Needle:  Needle type: Tuohy  Needle gauge: 17 G Needle length: 9 cm and 9 Needle insertion depth: 8 cm Catheter type: closed end flexible Catheter size: 19 Gauge Catheter at skin depth: 14 cm Test dose: negative and Other  Assessment Events: blood not aspirated, injection not painful, no injection resistance, no paresthesia and negative IV test  Additional Notes Patient identified. Risks and benefits discussed including failed block, incomplete  Pain control, post dural puncture headache, nerve damage, paralysis, blood pressure Changes, nausea, vomiting, reactions to medications-both toxic and allergic and post Partum back pain. All questions were answered. Patient expressed understanding and wished to proceed. Sterile technique was used throughout procedure. Epidural site was Dressed with sterile barrier dressing. No paresthesias, signs of intravascular injection Or signs of intrathecal spread were encountered.  Patient was more comfortable after the epidural was dosed. Please see RN's note for documentation of vital signs and FHR which are stable. Reason for block:procedure for pain

## 2021-09-15 NOTE — Progress Notes (Signed)
Labor Progress Note Lisa Charles is a 36 y.o. G3P2002 at [redacted]w[redacted]d presented for IOL for cHTN on meds S:  Feels well. Ok with another check. Feels some cramping but rates 0/10  O:  BP 131/63   Pulse 79   Temp 98.1 F (36.7 C) (Oral)   Resp 18   Ht 5\' 10"  (1.778 m)   Wt (!) 155.7 kg   LMP 12/07/2020 (Approximate)   BMI 49.26 kg/m  EFM: 130s-140s/moderate variability/+accels/ at times having variables that resolved with repositioning  CVE: Dilation: 2.5 Effacement (%): 60 Cervical Position: Posterior Station: -3 Presentation: Vertex Exam by:: Dr. [email or web address]   A&P: 36 y.o.  31 at [redacted]w[redacted]d presented for IOL for cHTN on meds #Labor: Progressing well. Now s/p cytotec x2. Cooks balloon placed with current check and tolerated well by fetus and patient. Discussed starting pitocin and patient would like to hold off as she is hoping to avoid getting an epidural and last pregnancy when she got on pitocin she needed epidural.  We discussed that it is reasonable to hold off for now but pitocin would likely help with fetal head descent as still very posterior. Patient expresses understanding and upon shared decision making made plan to assess contraction pattern in 1-2 hours and start pitocin if spaced out. #Pain: trying to be unmedicated, open to epidural if needed #FWB: Cat I (at times cat II with variable decels but improve with position changes). Also with baseline changes at times and prolonged accels to 150s #GBS positive>PCN  #cHTN BP in 120s-130s/50s-60s. Did not end up getting procardia as BP improved. Will continue to monitor  [redacted]w[redacted]d, MD, MPH OB Fellow, Faculty Practice

## 2021-09-15 NOTE — Op Note (Signed)
PreOp Diagnosis: 1) Intrauterine pregnancy @ [redacted]w[redacted]d 2) Fetal intolerance to labor 3) Chronic HTN 4) Morbid Obesity 5) Desires sterilization  PostOp Diagnosis: same Procedure: Primary C-section and bilateral salpingectomy Surgeon: Dr. Myna Hidalgo Anesthesia: epidural Complications: none EBL: 570cc UOP: 100cc Fluids: 800cc  Findings: Female infant from vertex presentation- asynclitic with tight nuchal cord.  Normal uterus, tubes and ovaries bilaterally  PROCEDURE:  Informed consent was obtained from the patient with risks, benefits, complications, treatment options, and expected outcomes discussed with the patient.  The patient concurred with the proposed plan, giving informed consent with form signed.   The patient was taken to Operating Room, and identified with the procedure verified as C-Section Delivery with Time Out. With induction of anesthesia, the patient was prepped and draped in the usual sterile fashion. A Pfannenstiel incision was made and carried down through the subcutaneous tissue to the fascia. The fascia was incised in the midline and extended transversely. The superior aspect of the fascial incision was grasped with Kochers elevated and the underlying muscle dissected off. The inferior aspect of the facial incision was in similar fashion, grasped elevated and rectus muscles dissected off. The peritoneum was identified and entered. Peritoneal incision was extended longitudinally. Alexis retractor was placed.  A low transverse uterine incision was made and the infants head delivered atraumatically. After the umbilical cord was clamped and cut cord blood was obtained for evaluation.   The placenta was removed intact and appeared normal. The uterine outline, tubes and ovaries appeared normal. The uterine incision was closed with running locked sutures of 0 Vicryl and a second layer of the same stitch was used in an imbricating fashion.    Attention was then turned to the left  fallopian tube. Kelly forceps were placed on the mesosalpinx underneath most of the tube.  This pedicle was double suture ligated with 2-0 Vicryl, and the tube including the fimbriated end was excised.  The right fallopian tube was then identified, doubly ligated, and was excised in a similar fashion allowing for bilateral tubal sterilization via bilateral salpingectomy.  Good hemostasis was noted overall.   Alexis retractor was removed.  Peritoneum was closed in a running fashion. The fascia was then reapproximated with running sutures of 0 Vicryl. The subcutaneous tissue was reapproximated with 2-0 plain gut suture.  The skin was closed with 4-0 vicryl in a subcuticular fashion.  Instrument, sponge, and needle counts were correct prior the abdominal closure and at the conclusion of the case. The patient was taken to recovery in stable condition.   Myna Hidalgo, DO Attending Obstetrician & Gynecologist, [facility] for [employer], [facility]

## 2021-09-15 NOTE — Anesthesia Preprocedure Evaluation (Addendum)
Anesthesia Evaluation  Patient identified by MRN, date of birth, ID band Patient awake    Reviewed: Allergy & Precautions, Patient's Chart, lab work & pertinent test results  Airway Mallampati: III       Dental no notable dental hx.    Pulmonary former smoker,    Pulmonary exam normal        Cardiovascular hypertension, Pt. on medications Normal cardiovascular exam     Neuro/Psych negative neurological ROS  negative psych ROS   GI/Hepatic Neg liver ROS, GERD  ,  Endo/Other  Morbid obesity  Renal/GU negative Renal ROS  negative genitourinary   Musculoskeletal negative musculoskeletal ROS (+)   Abdominal (+) + obese,   Peds  Hematology  (+) Blood dyscrasia, anemia ,   Anesthesia Other Findings   Reproductive/Obstetrics (+) Pregnancy Pre eclampsia                            Anesthesia Physical Anesthesia Plan  ASA: 3  Anesthesia Plan: Epidural   Post-op Pain Management:    Induction:   PONV Risk Score and Plan:   Airway Management Planned: Natural Airway  Additional Equipment:   Intra-op Plan:   Post-operative Plan:   Informed Consent: I have reviewed the patients History and Physical, chart, labs and discussed the procedure including the risks, benefits and alternatives for the proposed anesthesia with the patient or authorized representative who has indicated his/her understanding and acceptance.       Plan Discussed with: Anesthesiologist  Anesthesia Plan Comments:         Anesthesia Quick Evaluation

## 2021-09-15 NOTE — Progress Notes (Signed)
Labor Progress Note MEKISHA BITTEL is a 36 y.o. G3P2002 at [redacted]w[redacted]d presented for IOL for CHTN  S:  Comfortable with epidural. No c/o.   O:  BP 122/79   Pulse (!) 107   Temp 97.8 F (36.6 C) (Oral)   Resp 17   Ht 5\' 10"  (1.778 m)   Wt (!) 155.7 kg   LMP 12/07/2020 (Approximate)   SpO2 98%   BMI 49.26 kg/m  EFM: baseline 130 bpm/ mod variability/ + accels/ variable decels  Toco/IUPC: 2-5/130 MVU SVE: Dilation: 8 Effacement (%): 80 Cervical Position: Posterior Station: Plus 1 Presentation: Vertex Exam by:: [email or web address]   A/P: 36 y.o. G3P2002 [redacted]w[redacted]d  1. Labor: active, protracted 2. FWB: Cat II 3. Pain: epidural 4. CHTN: stable  Dr. [redacted]w[redacted]d and myself at bedside for reassessment. Inadequate ctx. Will restart Pitocin at low dose. Anticipate labor progress and SVD.  Debroah Loop, CNM 2:19 PM

## 2021-09-15 NOTE — Lactation Note (Signed)
This note was copied from a baby's chart. Lactation Consultation Note Mom holding baby STS. Mom stated she latched the baby to the breast for a little bit. Mom BF her now 36 yr old for 1 1/2 yrs. Mom stated her now 1 yr old would not latch at all. Mom stated her nipples has never been this big before. LC didn't think they were to large for baby. Mom denied painful latch. Mom is breast/formula feeding. Encouraged to breast feed first before giving formula. Hand express ion reviewed. Noted colostrum dots. Mom has no questions at this time. Mom encouraged to feed baby 8-12 times/24 hours and with feeding cues.   Encouraged to call for questions or assistance.  Patient Name: Lisa Charles [id number] Date: 09/15/2021 Reason for consult: Initial assessment;Term Age:10 hours  Maternal Data Has patient been taught Hand Expression?: Yes Does the patient have breastfeeding experience prior to this delivery?: Yes How long did the patient breastfeed?: 1 1/2 yrs to her 1st child  Feeding    LATCH Score Latch: Repeated attempts needed to sustain latch, nipple held in mouth throughout feeding, stimulation needed to elicit sucking reflex.  Audible Swallowing: None  Type of Nipple: Everted at rest and after stimulation  Comfort (Breast/Nipple): Soft / non-tender  Hold (Positioning): Full assist, staff holds infant at breast  LATCH Score: 5   Lactation Tools Discussed/Used    Interventions Interventions: Breast feeding basics reviewed;Skin to skin;Hand express;LC Services brochure  Discharge    Consult Status Consult Status: Follow-up Date: 09/16/21 Follow-up type: In-patient    Charyl Dancer 09/15/2021, 11:25 PM

## 2021-09-15 NOTE — Progress Notes (Signed)
Lisa Charles is a 36 y.o. G3P2002 at [redacted]w[redacted]d by LMP admitted for induction of labor due to [facility] on medication.  Subjective: Feeling well, ready for an epidural. She thinks her water may have broken this morning. When she went to the bathroom she had a gush of clear-yellow fluid mixed with blood.   Objective: BP (!) 134/54   Pulse 71   Temp 97.7 F (36.5 C) (Oral)   Resp 16   Ht 5\' 10"  (1.778 m)   Wt (!) 155.7 kg   LMP 12/07/2020 (Approximate)   BMI 49.26 kg/m  No intake/output data recorded. No intake/output data recorded.  FHT:  FHR: 130 bpm, variability: moderate,  accelerations:  Present,  decelerations:  Absent. Not well traced on the monitor. Preparing for epidural.  UC:   irregular, every 2-3 minutes SVE:   Dilation: 4.5 Effacement (%): 60 Station: -3 Exam by:: [email or web address], RN  Labs: Lab Results  Component Value Date   WBC 10.5 09/15/2021   HGB 10.2 (L) 09/15/2021   HCT 31.7 (L) 09/15/2021   MCV 77.9 (L) 09/15/2021   PLT 236 09/15/2021    Assessment / Plan: Induction of labor due to gestational hypertension,  progressing well on pitocin  Labor: Progressing on Pitocin, will continue to increase then AROM. Plan for epidural first, RN to check dilation. If water still intact with AROM.  Pain Control:  Epidural   11/16/2021, DO 09/15/2021, 9:04 AM

## 2021-09-15 NOTE — Transfer of Care (Signed)
Immediate Anesthesia Transfer of Care Note  Patient: Lisa Charles  Procedure(s) Performed: CESAREAN SECTION  Patient Location: PACU  Anesthesia Type:Epidural  Level of Consciousness: awake, alert  and patient cooperative  Airway & Oxygen Therapy: Patient Spontanous Breathing  Post-op Assessment: Report given to RN and Post -op Vital signs reviewed and stable  Post vital signs: Reviewed and stable  Last Vitals:  Vitals Value Taken Time  BP 135/56 09/15/21 2115  Temp    Pulse 96 09/15/21 2119  Resp 25 09/15/21 2119  SpO2 99 % 09/15/21 2119  Vitals shown include unvalidated device data.  Last Pain:  Vitals:   09/15/21 1902  TempSrc:   PainSc: 6          Complications: No notable events documented.

## 2021-09-15 NOTE — Plan of Care (Signed)

## 2021-09-15 NOTE — Progress Notes (Addendum)
Labor Progress Note Lisa Charles is a 36 y.o. G3P2002 at [redacted]w[redacted]d presented for IOL due to cHTN on medications S:  Doing well. Would like to rest some. Ok with check now.  O:  BP (!) 151/69   Pulse 77   Temp 97.9 F (36.6 C) (Oral)   Resp 20   Ht 5\' 10"  (1.778 m)   Wt (!) 155.7 kg   LMP 12/07/2020 (Approximate)   BMI 49.26 kg/m  EFM: 140/moderate variability/+accels/no decels  CVE: Dilation: Closed (outer oss 1.5 cm) Effacement (%): Thick Cervical Position: Posterior Station: -2 Presentation: Vertex Exam by:: Dr. [email or web address]   A&P: 36 y.o. 36 at [redacted]w[redacted]d presented for IOL due to cHTN on medications #Labor: cervix closed but external os 1-2 cm and stretchy. Will give another dose of buccal cytotec and assess for balloon at next check. Discussed with patient regarding movement including sitting on birthing ball or going on a walk. Patient would like to rest some and then open to movement. Will plan on recheck in 4 hours.  #FWB: Cat I #GBS positive>PCN   #cHTN on Procardia Last two BP in 130s-150s. Will plan to give home Procardia now since patient has not taken her meds today   [redacted]w[redacted]d, MD, MPH OB Fellow, Faculty Practice

## 2021-09-15 NOTE — Progress Notes (Signed)
Labor Progress Note Lisa Charles is a 36 y.o. G3P2002 at [redacted]w[redacted]d presented for IOL for CHTN  S:  Comfortable with epidural.   O:  BP 107/60   Pulse (!) 107   Temp 97.8 F (36.6 C) (Oral)   Resp 16   Ht 5\' 10"  (1.778 m)   Wt (!) 155.7 kg   LMP 12/07/2020 (Approximate)   SpO2 98%   BMI 49.26 kg/m  EFM: baseline 130 bpm/ mod variability/ no accels/ variable decels  Toco/IUPC: 3-4/180 SVE: Dilation: 7 Effacement (%): 80 Cervical Position: Posterior Station: Plus 1 Presentation: Vertex Exam by:: Dr. [email or web address] Pitocin: 2 mu/min  A/P: 36 y.o. G3P2002 [redacted]w[redacted]d  1. Labor: protracted active 2. FWB: Cat II 3. Pain: epidural 4. CHTN: stable  Dr. [redacted]w[redacted]d at bedside to reassess. Discussed potential need for CS if no progress or change in fetal status. Will increase in Pitocin to achieve adequate labor. Anticipate labor progress and SVD.  Debroah Loop, CNM 3:52 PM

## 2021-09-15 NOTE — Progress Notes (Signed)
Labor Progress Note Lisa Charles is a 36 y.o. G3P2002 at [redacted]w[redacted]d presented for IOL for CHTN  S:  Comfortable with epidural. No c/o.  O:  BP (!) 116/56   Pulse 78   Temp 97.8 F (36.6 C) (Axillary)   Resp 16   Ht 5\' 10"  (1.778 m)   Wt (!) 155.7 kg   LMP 12/07/2020 (Approximate)   SpO2 100%   BMI 49.26 kg/m  EFM: baseline 125 bpm/ mod variability/ + accels/ occ variable decels  Toco/IUPC: undeterminable SVE: Dilation: 6 Effacement (%): 80 Cervical Position: Posterior Station: -1 Presentation: Vertex Exam by:: Ronnisha Felber, CNM Pitocin: 8 mu/min  A/P: 36 y.o. G3P2002 [redacted]w[redacted]d  1. Labor: latent 2. FWB: Cat II 3. Pain: epidural 4. CHTN: stable  Consented for IUPC (indication: failed toco), placed w/o difficulty. Continue Pitocin titration. Anticipate labor progress and SVD.  [redacted]w[redacted]d, CNM 11:19 AM

## 2021-09-15 NOTE — Progress Notes (Signed)
At bedside to review management options.  FHT- Cat 2 with variables with recurrent variables to 60bpm with each contractions.  Pt repositioned, Pitocin decreased.  Reviewed management options- discussed C-section due to fetal intolerance to labor.  Risk benefits and alternatives of cesarean section were discussed with the patient including but not limited to infection, bleeding, damage to bowel , bladder and baby with the need for further surgery. Alternatives reviewed with patient.  Questions and concerns were addressed and she desires to proceed. Pt also desires tubal ligation for sterilization.   Patient desires permanent sterilization.  Other reversible forms of contraception were discussed with patient; she declines all other modalities. Risks of procedure discussed with patient including but not limited to: risk of regret, permanence of method, bleeding, infection, injury to surrounding organs and need for additional procedures.  Failure risk of about 1% with increased risk of ectopic gestation if pregnancy occurs was also discussed with patient.  Also discussed possibility of post-tubal pain syndrome. Patient verbalized understanding of these risks and wants to proceed with sterilization.  Written informed consent obtained.  To OR when ready.   Myna Hidalgo, DO Attending Obstetrician & Gynecologist, [facility] for [employer], [facility]

## 2021-09-15 NOTE — Discharge Summary (Addendum)
Postpartum Discharge Summary  Date of Service updated 09/17/2021     Patient Name: Lisa Charles DOB: September 18, 1985 MRN: [id number]  Date of admission: 09/14/2021 Delivery date:09/15/2021  Delivering provider: Janyth Pupa  Date of discharge: 09/17/2021  Admitting diagnosis: Chronic hypertension affecting pregnancy [O10.919] Intrauterine pregnancy: [redacted]w[redacted]d    Secondary diagnosis:  Principal Problem:   Chronic hypertension affecting pregnancy  Additional problems: None    Discharge diagnosis: Term Pregnancy Delivered                                              Post partum procedures: None Augmentation: Pitocin, Cytotec, and IP Foley Complications: pLTCS for fetal intolerance of labor  Hospital course: Induction of Labor With Cesarean Section   36y.o. yo G3P2002 at 388w1das admitted to the hospital 09/14/2021 for induction of labor due to chronic HTN that was managed with Procardia.  IOL started with cytotec and a Foley balloon.  Induction continued with Pitocin and AROM.  Internal monitors were placed due to Cat. 2 tracing.  Despite resuscitative measures, Cat. 2 tracing continued with minimal cervical change.  Patient had a labor course significant for fetal intolerance to labor. The patient went for cesarean section due to  fetal intolerance to labor/non-reassuring fetal well-being . Delivery details are as follows: Membrane Rupture Time/Date: 7:50 AM ,09/15/2021   Delivery Method:C-Section, Low Transverse  Details of operation can be found in separate operative Note.  Patient had an uncomplicated postpartum course. She is ambulating, tolerating a regular diet, passing flatus, and urinating well.  Patient is discharged home in stable condition on 09/17/21.   BP wnl, will discharge on procardia. With 1 week bp and incision check.  Newborn Data: Birth date:09/15/2021  Birth time:8:13 PM  Gender:Female  Living status:Living  Apgars:8 ,9  Weight:4054 g                                 Magnesium Sulfate received: No BMZ received: No Rhophylac:No MMR:No T-DaP:Given prenatally Flu: N/A Transfusion: None  Physical exam  Vitals:   09/16/21 1100 09/16/21 1525 09/16/21 2146 09/17/21 0435  BP:  (!) 113/58 (!) 142/79 (!) 108/58  Pulse:  78 79 74  Resp:  '16 16 18  ' Temp:  97.9 F (36.6 C) 97.7 F (36.5 C) 97.6 F (36.4 C)  TempSrc:  Axillary  Oral  SpO2: 100%   98%  Weight:      Height:       General: alert, cooperative, and no distress Lochia: appropriate Uterine Fundus: firm Incision: Dressing is clean, dry, and intact DVT Evaluation: No evidence of DVT seen on physical exam. Negative Homan's sign. Labs: Lab Results  Component Value Date   WBC 24.1 (H) 09/16/2021   HGB 9.3 (L) 09/16/2021   HCT 28.9 (L) 09/16/2021   MCV 78.5 (L) 09/16/2021   PLT 217 09/16/2021      Latest Ref Rng & Units 09/14/2021    4:52 PM  CMP  Glucose 70 - 99 mg/dL 86   BUN 6 - 20 mg/dL 7   Creatinine 0.44 - 1.00 mg/dL 0.55   Sodium 135 - 145 mmol/L 135   Potassium 3.5 - 5.1 mmol/L 3.7   Chloride 98 - 111 mmol/L 103   CO2 22 - 32 mmol/L 19  Calcium 8.9 - 10.3 mg/dL 9.7   Total Protein 6.5 - 8.1 g/dL 7.3   Total Bilirubin 0.3 - 1.2 mg/dL 0.5   Alkaline Phos 38 - 126 U/L 151   AST 15 - 41 U/L 24   ALT 0 - 44 U/L 19    Edinburgh Score:    09/16/2021    8:00 PM  Edinburgh Postnatal Depression Scale Screening Tool  I have been able to laugh and see the funny side of things. 0  I have looked forward with enjoyment to things. 0  I have blamed myself unnecessarily when things went wrong. 2  I have been anxious or worried for no good reason. 2  I have felt scared or panicky for no good reason. 0  Things have been getting on top of me. 0  I have been so unhappy that I have had difficulty sleeping. 0  I have felt sad or miserable. 0  I have been so unhappy that I have been crying. 0  The thought of harming myself has occurred to me. 0  Edinburgh Postnatal Depression Scale  Total 4     After visit meds:  Allergies as of 09/17/2021       Reactions   Expectorant Cough Control [guaifenesin] Other (See Comments)   Radiating pain around body and back. Pt states she can not take ay kind of expectorant.         Medication List     STOP taking these medications    aspirin EC 81 MG tablet   Blood Pressure Kit Devi   Gojji Weight Scale Misc       TAKE these medications    ferrous sulfate 325 (65 FE) MG tablet Commonly known as: FerrouSul Take 1 tablet (325 mg total) by mouth every other day.   ibuprofen 600 MG tablet Commonly known as: ADVIL Take 1 tablet (600 mg total) by mouth every 6 (six) hours.   multivitamin-prenatal 27-0.8 MG Tabs tablet Take 1 tablet by mouth daily at 12 noon.   NIFEdipine 30 MG 24 hr tablet Commonly known as: PROCARDIA-XL/NIFEDICAL-XL Take 1 tablet (30 mg total) by mouth daily. Can increase to twice a day as needed for symptomatic contractions   oxyCODONE 5 MG immediate release tablet Commonly known as: Oxy IR/ROXICODONE Take 1-2 tablets (5-10 mg total) by mouth every 4 (four) hours as needed for moderate pain.         Discharge home in stable condition Infant Feeding: Bottle and Breast Infant Disposition:home with mother Discharge instruction: per After Visit Summary and Postpartum booklet. Activity: Advance as tolerated. Pelvic rest for 6 weeks.  Diet: routine diet Future Appointments: Future Appointments  Date Time Provider [place]  09/23/2021  4:00 PM [place] None  10/14/2021  3:50 PM Woodroe Mode, MD [place] None   Follow up Visit:  [place] Follow up in 1 week(s).   Contact information: [address] [place] [id number] [phone]                Janyth Pupa, DO  P [facility] Please schedule this patient for Postpartum visit in: 1 week with the following provider: Any  provider  In-Person  For C/S patients schedule nurse incision check in weeks 1 week for an incision check  High risk pregnancy complicated by: HTN  Delivery mode:  CS  Anticipated Birth Control:  BTL  PP Procedures needed: Incision  check, BP check Edinburgh: pending Schedule Integrated BH visit: no  no relevant baby issues    09/17/2021 Desma Maxim, MD

## 2021-09-16 ENCOUNTER — Encounter: Payer: Medicaid Other | Admitting: Obstetrics and Gynecology

## 2021-09-16 ENCOUNTER — Encounter (HOSPITAL_COMMUNITY): Payer: Self-pay | Admitting: Obstetrics & Gynecology

## 2021-09-16 LAB — CBC
HCT: 28.9 % — ABNORMAL LOW (ref 36.0–46.0)
Hemoglobin: 9.3 g/dL — ABNORMAL LOW (ref 12.0–15.0)
MCH: 25.3 pg — ABNORMAL LOW (ref 26.0–34.0)
MCHC: 32.2 g/dL (ref 30.0–36.0)
MCV: 78.5 fL — ABNORMAL LOW (ref 80.0–100.0)
Platelets: 217 10*3/uL (ref 150–400)
RBC: 3.68 MIL/uL — ABNORMAL LOW (ref 3.87–5.11)
RDW: 16.8 % — ABNORMAL HIGH (ref 11.5–15.5)
WBC: 24.1 10*3/uL — ABNORMAL HIGH (ref 4.0–10.5)
nRBC: 0 % (ref 0.0–0.2)

## 2021-09-16 MED ORDER — FERROUS FUMARATE 324 (106 FE) MG PO TABS
1.0000 | ORAL_TABLET | ORAL | Status: DC
  Administered 2021-09-16: 106 mg via ORAL
  Filled 2021-09-16: qty 1

## 2021-09-16 NOTE — Progress Notes (Signed)
POSTPARTUM PROGRESS NOTE  Post Partum Day 1  Subjective:  Lisa Charles is a 36 y.o. [phone] s/p Cesarean section for fetal intolerance of labor at [redacted]w[redacted]d.  No acute events overnight.  Pt reports dizziness on standing thus has not yet ambulated, foley remains in place. Denies problems with po intake.  She denies nausea or vomiting.  Pain is well controlled.  She has not had flatus. She has not had bowel movement.  Lochia Small. Incision soiled, has not been replaced since surgery.  Objective: Blood pressure 122/66, pulse 88, temperature 98.6 F (37 C), temperature source Oral, resp. rate 17, height 5\' 10"  (1.778 m), weight (!) 155.7 kg, last menstrual period 12/07/2020, SpO2 99 %, unknown if currently breastfeeding.  Physical Exam:  General: alert, cooperative and no distress Chest: no respiratory distress Heart:regular rate, distal pulses intact Abdomen: soft, nontender,  Uterine Fundus: firm, appropriately tender DVT Evaluation: No calf swelling or tenderness Extremities: No significant edema Skin: warm, dry; incision dry and intact but saturated  Recent Labs    09/15/21 2115 09/16/21 0509  HGB 10.3* 9.3*  HCT 32.8* 28.9*    Assessment/Plan: Lisa Charles is a 36 y.o. [phone] s/p Cesarean section due to fetal intolerance of labor at [redacted]w[redacted]d   PPD#1 - Doing well, pain well-controlled with no acute concerns. Discontinue foley today. Post-op CBC hgb 10.3 -> 9.3.  Contraception: S/p BTL Feeding: Breast/bottle Dispo: Plan for discharge within the next 24-48 hrs.   LOS: 2 days   [redacted]w[redacted]d, MD 09/16/2021, 6:26 AM

## 2021-09-16 NOTE — Anesthesia Postprocedure Evaluation (Signed)
Anesthesia Post Note  Patient: Lisa Charles  Procedure(s) Performed: CESAREAN SECTION     Patient location during evaluation: PACU Anesthesia Type: Epidural Level of consciousness: awake Pain management: pain level controlled Vital Signs Assessment: post-procedure vital signs reviewed and stable Respiratory status: spontaneous breathing Cardiovascular status: stable Postop Assessment: no headache, no backache, epidural receding, patient able to bend at knees and no apparent nausea or vomiting Anesthetic complications: no   No notable events documented.  Last Vitals:  Vitals:   09/15/21 2235 09/15/21 2335  BP: (!) 119/57 116/66  Pulse: 92 90  Resp: 18 17  Temp: 36.7 C 36.9 C  SpO2: 98% 99%    Last Pain:  Vitals:   09/15/21 2335  TempSrc: Oral  PainSc:    Pain Goal:                   Caren Macadam

## 2021-09-17 ENCOUNTER — Other Ambulatory Visit (HOSPITAL_COMMUNITY): Payer: Self-pay

## 2021-09-17 MED ORDER — OXYCODONE HCL 5 MG PO TABS
5.0000 mg | ORAL_TABLET | ORAL | 0 refills | Status: AC | PRN
  Filled 2021-09-17: qty 20, 2d supply, fill #0

## 2021-09-17 MED ORDER — FUROSEMIDE 20 MG PO TABS
20.0000 mg | ORAL_TABLET | Freq: Every day | ORAL | 0 refills | Status: DC
  Filled 2021-09-17: qty 5, 5d supply, fill #0

## 2021-09-17 MED ORDER — FERROUS SULFATE 325 (65 FE) MG PO TABS
325.0000 mg | ORAL_TABLET | ORAL | 1 refills | Status: AC
  Filled 2021-09-17: qty 60, 120d supply, fill #0

## 2021-09-17 MED ORDER — IBUPROFEN 600 MG PO TABS
600.0000 mg | ORAL_TABLET | Freq: Four times a day (QID) | ORAL | 0 refills | Status: AC
  Filled 2021-09-17: qty 30, 8d supply, fill #0

## 2021-09-17 MED ORDER — OXYCODONE HCL 5 MG PO TABS: 5.0000 mg | ORAL_TABLET | ORAL | 0 refills | Status: DC | PRN

## 2021-09-20 ENCOUNTER — Encounter: Payer: Self-pay | Admitting: Obstetrics and Gynecology

## 2021-09-20 LAB — SURGICAL PATHOLOGY

## 2021-09-21 ENCOUNTER — Encounter (HOSPITAL_COMMUNITY): Payer: Self-pay | Admitting: Obstetrics and Gynecology

## 2021-09-21 ENCOUNTER — Inpatient Hospital Stay (HOSPITAL_BASED_OUTPATIENT_CLINIC_OR_DEPARTMENT_OTHER): Admit: 2021-09-21 | Discharge: 2021-09-21 | Disposition: A | Discharge status: Home / Self Care | Payer: 59

## 2021-09-21 ENCOUNTER — Telehealth: Payer: Self-pay | Admitting: Emergency Medicine

## 2021-09-21 ENCOUNTER — Inpatient Hospital Stay (HOSPITAL_COMMUNITY)
Admission: AD | Admit: 2021-09-21 | Discharge: 2021-09-21 | Disposition: A | Discharge status: Home / Self Care | Payer: 59 | Attending: Obstetrics and Gynecology | Admitting: Obstetrics and Gynecology

## 2021-09-21 ENCOUNTER — Other Ambulatory Visit: Payer: Self-pay

## 2021-09-21 DIAGNOSIS — O9089 Other complications of the puerperium, not elsewhere classified: Secondary | ICD-10-CM | POA: Diagnosis present

## 2021-09-21 DIAGNOSIS — R519 Headache, unspecified: Secondary | ICD-10-CM | POA: Diagnosis not present

## 2021-09-21 DIAGNOSIS — R600 Localized edema: Secondary | ICD-10-CM | POA: Diagnosis not present

## 2021-09-21 DIAGNOSIS — Z4889 Encounter for other specified surgical aftercare: Secondary | ICD-10-CM

## 2021-09-21 DIAGNOSIS — M7989 Other specified soft tissue disorders: Secondary | ICD-10-CM | POA: Diagnosis not present

## 2021-09-21 DIAGNOSIS — I10 Essential (primary) hypertension: Secondary | ICD-10-CM

## 2021-09-21 LAB — COMPREHENSIVE METABOLIC PANEL
ALT: 31 U/L (ref 0–44)
AST: 38 U/L (ref 15–41)
Albumin: 3 g/dL — ABNORMAL LOW (ref 3.5–5.0)
Alkaline Phosphatase: 114 U/L (ref 38–126)
Anion gap: 13 (ref 5–15)
BUN: 12 mg/dL (ref 6–20)
CO2: 23 mmol/L (ref 22–32)
Calcium: 9.2 mg/dL (ref 8.9–10.3)
Chloride: 105 mmol/L (ref 98–111)
Creatinine, Ser: 0.61 mg/dL (ref 0.44–1.00)
GFR, Estimated: >60 mL/min (ref >60)
Glucose, Bld: 85 mg/dL (ref 70–99)
Potassium: 3.8 mmol/L (ref 3.5–5.1)
Sodium: 141 mmol/L (ref 135–145)
Total Bilirubin: 0.6 mg/dL (ref 0.3–1.2)
Total Protein: 6.7 g/dL (ref 6.5–8.1)

## 2021-09-21 LAB — CBC
HCT: 25.8 % — ABNORMAL LOW (ref 36.0–46.0)
Hemoglobin: 8.2 g/dL — ABNORMAL LOW (ref 12.0–15.0)
MCH: 25.1 pg — ABNORMAL LOW (ref 26.0–34.0)
MCHC: 31.8 g/dL (ref 30.0–36.0)
MCV: 78.9 fL — ABNORMAL LOW (ref 80.0–100.0)
Platelets: 289 10*3/uL (ref 150–400)
RBC: 3.27 MIL/uL — ABNORMAL LOW (ref 3.87–5.11)
RDW: 16.7 % — ABNORMAL HIGH (ref 11.5–15.5)
WBC: 8.7 10*3/uL (ref 4.0–10.5)
nRBC: 0 % (ref 0.0–0.2)

## 2021-09-21 LAB — URINALYSIS, ROUTINE W REFLEX MICROSCOPIC
Bacteria, UA: NONE SEEN
Bilirubin Urine: NEGATIVE
Glucose, UA: NEGATIVE mg/dL
Ketones, ur: NEGATIVE mg/dL
Nitrite: NEGATIVE
Protein, ur: NEGATIVE mg/dL
RBC / HPF: >50 RBC/hpf — ABNORMAL HIGH (ref 0–5)
Specific Gravity, Urine: 1.011 (ref 1.005–1.030)
pH: 8 (ref 5.0–8.0)

## 2021-09-21 MED ORDER — NIFEDIPINE ER OSMOTIC RELEASE 30 MG PO TB24: 30.0000 mg | ORAL_TABLET | Freq: Every day | ORAL | 2 refills | Status: AC

## 2021-09-21 MED ORDER — ACETAMINOPHEN 500 MG PO TABS: 1000.0000 mg | ORAL_TABLET | Freq: Once | ORAL | Status: DC

## 2021-09-21 MED ORDER — NIFEDIPINE ER OSMOTIC RELEASE 30 MG PO TB24
30.0000 mg | ORAL_TABLET | Freq: Once | ORAL | Status: AC
  Administered 2021-09-21: 30 mg via ORAL
  Filled 2021-09-21: qty 1

## 2021-09-21 NOTE — Progress Notes (Signed)
Right lower extremity venous duplex has been completed. Preliminary results can be found in CV Proc through chart review.  Results were given to Gerrit Heck CNM.  09/21/21 12:41 PM Olen Cordial RVT

## 2021-09-21 NOTE — MAU Provider Note (Signed)
History     CSN: [id number]  Arrival date and time: 09/21/21 0941   Event Date/Time   First Provider Initiated Contact with Patient 09/21/21 1138      Chief Complaint  Patient presents with   BP Evaluation   Lisa Charles is a 36 y.o. G3P3003 at Unknown who receives care at [facility].  She presents today for BP Evaluation. She states she took her bp this morning and it was elevated.  However, she states she feels her home machine is not properly calibrated.  She endorses HA, but calls it "slight"  and describes it as a "little pain just sitting there."  She reports the pain is located just above her left eye and rates it a 3/10. She denies visual disturbances or SOB.  She does report that her right leg is swollen and noticed this yesterday.  She states she elevated it with minimal improvement.  She denies pain in the leg.  She also reports concern with her incision stating this morning she heard "a pop and a gush."  She states she cleaned the area and put a homemade gauze on it and noticed that it was saturated with blood.  She denies pain in the area. She reports taking her medications (iron supplement, PNV, oxycodone, and ibuprofen) yesterday.     OB History     Gravida  3   Para  3   Term  3   Preterm  0   AB  0   Living  3      SAB  0   IAB  0   Ectopic  0   Multiple  0   Live Births  3           Past Medical History:  Diagnosis Date   Bronchitis    Hay fever    Pre-eclampsia     Past Surgical History:  Procedure Laterality Date   CESAREAN SECTION N/A 09/15/2021   Procedure: CESAREAN SECTION;  Surgeon: Myna Hidalgo, DO;  Location: [facility] LD ORS;  Service: Obstetrics;  Laterality: N/A;   NO PAST SURGERIES      Family History  Problem Relation Age of Onset   Diabetes Mother    Hypertension Mother    Hyperlipidemia Mother    Gout Father    Diabetes Maternal Grandmother    Hypertension Paternal Grandmother    Diabetes Paternal Grandmother      Social History   Tobacco Use   Smoking status: Former    Types: Cigarettes    Quit date: 02/2016    Years since quitting: 5.6   Smokeless tobacco: Former  Building services engineer Use: Never used  Substance Use Topics   Alcohol use: No   Drug use: No    Allergies:  Allergies  Allergen Reactions   Expectorant Cough Control [Guaifenesin] Other (See Comments)    Radiating pain around body and back. Pt states she can not take ay kind of expectorant.     Medications Prior to Admission  Medication Sig Dispense Refill Last Dose   ferrous sulfate (FERROUSUL) 325 (65 FE) MG tablet Take 1 tablet (325 mg total) by mouth every other day. 60 tablet 1 Past Week   ibuprofen (ADVIL) 600 MG tablet Take 1 tablet (600 mg total) by mouth every 6 (six) hours. 30 tablet 0 Past Week   Prenatal Vit-Fe Fumarate-FA (MULTIVITAMIN-PRENATAL) 27-0.8 MG TABS tablet Take 1 tablet by mouth daily at 12 noon.   Past Week  NIFEdipine (PROCARDIA-XL/NIFEDICAL-XL) 30 MG 24 hr tablet Take 1 tablet (30 mg total) by mouth daily. Can increase to twice a day as needed for symptomatic contractions 30 tablet 2    oxyCODONE (OXY IR/ROXICODONE) 5 MG immediate release tablet Take 1-2 tablets (5-10 mg total) by mouth every 4 (four) hours as needed for moderate pain. 20 tablet 0     Review of Systems  Constitutional:  Negative for chills and fever.  Gastrointestinal:  Positive for constipation (Yesterday, hard to pass). Negative for abdominal pain, nausea and vomiting.  Genitourinary:  Positive for vaginal bleeding (Regular). Negative for difficulty urinating and dysuria.  Neurological:  Positive for headaches. Negative for dizziness and light-headedness.   Physical Exam   Blood pressure (!) 146/82, pulse 75, temperature (!) 97.4 F (36.3 C), temperature source Oral, resp. rate 18, height 5\' 10"  (1.778 m), weight (!) 155.4 kg, SpO2 98 %, unknown if currently breastfeeding. Vitals:   09/21/21 1054 09/21/21 1101 09/21/21  1125  BP:  140/73 (!) 146/82  Pulse:  76 75  Resp:  18   Temp:  (!) 97.4 F (36.3 C)   TempSrc:  Oral   SpO2:  97% 98%  Weight: (!) 155.4 kg    Height: 5\' 10"  (1.778 m)      Physical Exam Vitals reviewed.  Constitutional:      General: She is not in acute distress.    Appearance: Normal appearance. She is obese. She is not ill-appearing.  HENT:     Head: Normocephalic and atraumatic.  Eyes:     Conjunctiva/sclera: Conjunctivae normal.  Cardiovascular:     Rate and Rhythm: Normal rate and regular rhythm.     Heart sounds: Normal heart sounds.  Pulmonary:     Effort: No respiratory distress.     Breath sounds: Normal breath sounds.  Abdominal:     General: Bowel sounds are normal.     Palpations: Abdomen is soft.     Tenderness: There is no abdominal tenderness.     Comments: Incision intact with exception of area ~4cm from right side. Area with active bleeding that is increased with pressure to surrounding area.  Attempted to check fascia x 2 with inability to get qtip into incision  Dr. Rip Harbour able to check fascia reports intact.   Area packed, by provider, with idofoam 1/2in. ~1cm left outside of wound.  Area covered with tegaderm. Patient tolerated well.   Musculoskeletal:        General: Normal range of motion.     Cervical back: Normal range of motion.     Right lower leg: No tenderness. 3+ Edema present.     Left lower leg: No tenderness. 2+ Edema present.     Comments: Left Leg :48 calf/19 ankle Right: 58 calf/ 29 ankle  Skin:    General: Skin is warm and dry.  Neurological:     Mental Status: She is alert and oriented to person, place, and time.  Psychiatric:        Mood and Affect: Mood normal.        Behavior: Behavior normal.     MAU Course  Procedures Results for orders placed or performed during the hospital encounter of 09/21/21 (from the past 24 hour(s))  Urinalysis, Routine w reflex microscopic Urine, Clean Catch     Status: Abnormal   Collection  Time: 09/21/21 11:19 AM  Result Value Ref Range   Color, Urine YELLOW YELLOW   APPearance CLEAR CLEAR   Specific Gravity, Urine  1.011 1.005 - 1.030   pH 8.0 5.0 - 8.0   Glucose, UA NEGATIVE NEGATIVE mg/dL   Hgb urine dipstick MODERATE (A) NEGATIVE   Bilirubin Urine NEGATIVE NEGATIVE   Ketones, ur NEGATIVE NEGATIVE mg/dL   Protein, ur NEGATIVE NEGATIVE mg/dL   Nitrite NEGATIVE NEGATIVE   Leukocytes,Ua SMALL (A) NEGATIVE   RBC / HPF >50 (H) 0 - 5 RBC/hpf   WBC, UA 0-5 0 - 5 WBC/hpf   Bacteria, UA NONE SEEN NONE SEEN   Squamous Epithelial / LPF 0-5 0 - 5  CBC     Status: Abnormal   Collection Time: 09/21/21 11:41 AM  Result Value Ref Range   WBC 8.7 4.0 - 10.5 K/uL   RBC 3.27 (L) 3.87 - 5.11 MIL/uL   Hemoglobin 8.2 (L) 12.0 - 15.0 g/dL   HCT 63.8 (L) 46.6 - 59.9 %   MCV 78.9 (L) 80.0 - 100.0 fL   MCH 25.1 (L) 26.0 - 34.0 pg   MCHC 31.8 30.0 - 36.0 g/dL   RDW 35.7 (H) 01.7 - 79.3 %   Platelets 289 150 - 400 K/uL   nRBC 0.0 0.0 - 0.2 %  Comprehensive metabolic panel     Status: Abnormal   Collection Time: 09/21/21 11:41 AM  Result Value Ref Range   Sodium 141 135 - 145 mmol/L   Potassium 3.8 3.5 - 5.1 mmol/L   Chloride 105 98 - 111 mmol/L   CO2 23 22 - 32 mmol/L   Glucose, Bld 85 70 - 99 mg/dL   BUN 12 6 - 20 mg/dL   Creatinine, Ser 9.03 0.44 - 1.00 mg/dL   Calcium 9.2 8.9 - 00.9 mg/dL   Total Protein 6.7 6.5 - 8.1 g/dL   Albumin 3.0 (L) 3.5 - 5.0 g/dL   AST 38 15 - 41 U/L   ALT 31 0 - 44 U/L   Alkaline Phosphatase 114 38 - 126 U/L   Total Bilirubin 0.6 0.3 - 1.2 mg/dL   GFR, Estimated >23 >30 mL/min   Anion gap 13 5 - 15    MDM Physical Exam Labs: CBC, CMP Measure BPQ15 min Pain Management Vascular Services Antihypertensives Consult Follow Up Scheduled Assessment and Plan  36 year old, G3P3003  6 Days Postpartum CHTN S/P C/S Headache Right Leg Edema  -Reviewed POC with patient. -Exam performed and findings discussed.  -Informed that doppler studies to  be down for LLE -Patient offered and accepts pain medication. Tylenol ordered. -Procardia 30mg  ordered.  Informed that she will take daily for known CHTN.  -Patient verbalizes understanding. -Wound inspected and noted as above. Informed that MD would be contacted regarding appropriateness of CT scan.  -Dr. consulted and informed of patient status, evaluation, interventions, and results.  -MD to bedside and assesses. States no CT necessary, but wound should be packed.  -Orders placed.  Plan to pack wound prior to discharge.  Austin Miles 09/21/2021, 11:38 AM   Reassessment (1:31 PM) Vitals:   09/21/21 1315 09/21/21 1316 09/21/21 1320 09/21/21 1339  BP:  (!) 112/92  136/74  Pulse:  82  75  Resp:    14  Temp:    97.8 F (36.6 C)  TempSrc:    Oral  SpO2: 99%  99%   Weight:      Height:        -Doppler studies return negative. -Provider to bedside to inform of findings. -Patient reports resolution of HA.  -Wound packed without incident. -Message sent,  via secure chat, to office to schedule for follow up. Scheduled for July 19th at 0930. -Patient informed and encouraged to go in early as possible. -Rx for Procardia sent to pharmacy on file. Cautioned regarding potential side effects of HA and dizziness. -Instructed to take bp monitor to office for calibrating. -Precautions reviewed.  -Instructed to continue medications as ordered and start Procardia tomorrow.  -Encouraged to call primary office or return to MAU if symptoms worsen or with the onset of new symptoms. -Discharged to home in improved condition.  Cherre Robins MSN, CNM Advanced Practice Provider, [facility]

## 2021-09-21 NOTE — MAU Note (Signed)
Lisa Charles is a 36 y.o. at Unknown here in MAU reporting: she's took BP this morning and BP's were 165/85 and 179/95.  Endorses current H/A, denies visual disturbances and epigastric pain.  Reports had increased BP's during the pregnancy.  Also reports swelling in right foot.  States also thinks her incision is opening. S/P Cesarean on 09/15/2021 for fetal intolerance to labor.   Onset of complaint: today Pain score: 3/10 Vitals:   09/21/21 1101  BP: 140/73  Pulse: 76  Resp: 18  Temp: (!) 97.4 F (36.3 C)  SpO2: 97%     FHT: N/A Lab orders placed from triage:   UA

## 2021-09-21 NOTE — Telephone Encounter (Signed)
TC to patient who sent mychart message.   Reports swelling in right foot since yesterday. SP Csection on 7/12, CHTN on procardia.  Has not taken procardia since delivery.  Took BP on phone with this RN with home cuff, first reading: 165/86, second reading 179/95.   Denies headaches, blurry vision, dizziness.  Following consult with Constant, MD, Pt recommended to present to MAU for assessment. Pt agrees with plan and states that she will go to hospital now.

## 2021-09-22 ENCOUNTER — Encounter: Payer: Self-pay | Admitting: Obstetrics & Gynecology

## 2021-09-22 ENCOUNTER — Ambulatory Visit (INDEPENDENT_AMBULATORY_CARE_PROVIDER_SITE_OTHER): Payer: 59 | Admitting: Obstetrics & Gynecology

## 2021-09-22 VITALS — BP 137/84 | HR 88 | Ht 70.0 in | Wt 336.0 lb

## 2021-09-22 DIAGNOSIS — O909 Complication of the puerperium, unspecified: Secondary | ICD-10-CM

## 2021-09-22 DIAGNOSIS — Z6841 Body Mass Index (BMI) 40.0 and over, adult: Secondary | ICD-10-CM

## 2021-09-22 NOTE — Progress Notes (Signed)
Patient presents for incision check and packing. Incision is currently bleeding, has a slight odor. Denies fever, chills. Has ongoing headache and dizziness that has been present since 09/15/21. Edema present in right foot.

## 2021-09-22 NOTE — Progress Notes (Signed)
Subjective:wound check     Lisa Charles is a 36 y.o. female who presents to the clinic 1 weeks status post  cesarean section . Eating a regular diet without difficulty. Bowel movements are normal. Pain is controlled with current analgesics. Medications being used: ibuprofen (OTC) and narcotic analgesics including oxycodone.  The following portions of the patient's history were reviewed and updated as appropriate: allergies, current medications, past family history, past medical history, past social history, past surgical history, and problem list.  Review of Systems Pertinent items are noted in HPI.    Objective:    BP 137/84   Pulse 88   Ht 5\' 10"  (1.778 m)   Wt (!) 336 lb (152.4 kg)   BMI 48.21 kg/m  General:  alert and cooperative  Abdomen: soft, non-tender  1 cm skin defect at mid incision with tape packing removed, sero-sanguinous fluid no pus or odor, pack was not replaced Assessment:    Postoperative course complicated by wound complication seroma draining spontaneously     Plan:    1. Continue any current medications. 2. Wound care discussed. 3. Activity restrictions: no lifting more than 15 pounds 4 Change dressing as needed 5. Follow up: 1 week for wound check Patient ID: , female   DOB: 1985-12-26, 36 y.o.   MRN: [id number]

## 2021-09-23 ENCOUNTER — Ambulatory Visit: Payer: 59

## 2021-09-29 ENCOUNTER — Ambulatory Visit (INDEPENDENT_AMBULATORY_CARE_PROVIDER_SITE_OTHER): Payer: 59 | Admitting: Obstetrics and Gynecology

## 2021-09-29 VITALS — BP 132/79 | HR 74

## 2021-09-29 DIAGNOSIS — Z4889 Encounter for other specified surgical aftercare: Secondary | ICD-10-CM

## 2021-09-29 NOTE — Progress Notes (Signed)
Patient was assessed and managed by nursing staff during this encounter. I have reviewed the chart and agree with the documentation and plan. I have also made any necessary editorial changes.  Catalina Antigua, MD 09/29/2021 2:23 PM

## 2021-09-29 NOTE — Progress Notes (Signed)
Subjective:     Lisa Charles is a 36 y.o. female who presents to the clinic 2 weeks status post  C/S .  Eating a regular diet without difficulty. Bowel movements are normal. Pain is controlled with current analgesics. Medications being used: ibuprofen (OTC).  The following portions of the patient's history were reviewed and updated as appropriate: allergies, current medications, past family history, past medical history, past social history, past surgical history, and problem list.  Review of Systems Pertinent items are noted in HPI.    Objective:    There were no vitals taken for this visit. General:  alert  Abdomen: soft, bowel sounds active, non-tender  Incision:   healing well, no drainage, no erythema, no hernia, no seroma, no swelling, no dehiscence, incision well approximated     Assessment:    Doing well postoperatively.   Plan:    Continue any current medications.  Wound care discussed.  Keep upcoming PP appt.     Marya Landry Dellion, CMA

## 2021-10-14 ENCOUNTER — Encounter: Payer: Self-pay | Admitting: Obstetrics & Gynecology

## 2021-10-14 ENCOUNTER — Ambulatory Visit (INDEPENDENT_AMBULATORY_CARE_PROVIDER_SITE_OTHER): Payer: 59 | Admitting: Obstetrics & Gynecology

## 2021-10-14 DIAGNOSIS — I10 Essential (primary) hypertension: Secondary | ICD-10-CM | POA: Diagnosis not present

## 2021-10-14 NOTE — Progress Notes (Signed)
Post Partum Visit Note  Lisa Charles is a 36 y.o. [id number] female who presents for a postpartum visit. She is 4 weeks postpartum following a primary cesarean section.  I have fully reviewed the prenatal and intrapartum course. The delivery was at 39.1 gestational weeks.  Anesthesia: epidural. Postpartum course has been unremarkable. Baby is doing well. Baby is feeding by breast. Bleeding staining only. Bowel function is normal. Bladder function is normal. Patient is not sexually active. Contraception method is bilateral salpingectomy, done at time of cesarean section. Postpartum depression screening: negative. She is on Procardia XL 30 mg daily for her HTN.    Edinburgh Postnatal Depression Scale - 10/14/21 1554       Edinburgh Postnatal Depression Scale:  In the Past 7 Days   I have been able to laugh and see the funny side of things. 0    I have looked forward with enjoyment to things. 0    I have blamed myself unnecessarily when things went wrong. 0    I have been anxious or worried for no good reason. 0    I have felt scared or panicky for no good reason. 0    Things have been getting on top of me. 0    I have been so unhappy that I have had difficulty sleeping. 0    I have felt sad or miserable. 0    I have been so unhappy that I have been crying. 0    The thought of harming myself has occurred to me. 0    Edinburgh Postnatal Depression Scale Total 0             Health Maintenance Due  Topic Date Due   COVID-19 Vaccine (4 - Moderna series) 04/03/2020   INFLUENZA VACCINE  10/05/2021    The following portions of the patient's history were reviewed and updated as appropriate: allergies, current medications, past family history, past medical history, past social history, past surgical history, and problem list.  Review of Systems Pertinent items noted in HPI and remainder of comprehensive ROS otherwise negative.  Objective:  BP 132/81   Pulse 68   Ht 5\' 9"  (1.753 m)    Wt (!) 325 lb (147.4 kg)   Breastfeeding Yes   BMI 47.99 kg/m    General:  alert and no distress   Breasts:  normal, and done in presence of RN as chaperone  Lungs: clear to auscultation bilaterally  Heart:  regular rate and rhythm  Abdomen: soft, non-tender; bowel sounds normal; no masses,  no organomegaly   Wound well approximated incision, healing well  GU exam:  not indicated       Assessment:   Normal postpartum exam.   Plan:   Essential components of care per ACOG recommendations:  1.  Mood and well being: Patient with negative depression screening today. Reviewed local resources for support.  - Patient tobacco use? No.   - hx of drug use? No.    2. Infant care and feeding:  -Patient currently breastmilk feeding? Yes. Reviewed importance of draining breast regularly to support lactation.  -Social determinants of health (SDOH) reviewed in EPIC. No concerns  3. Sexuality, contraception and birth spacing - Patient does not want a pregnancy in the next year.  Desired family size is 3 children.  - Already had bilateral salpingectomy,  4. Sleep and fatigue -Encouraged family/partner/community support of 4 hrs of uninterrupted sleep to help with mood and fatigue  5. Physical Recovery  -  Discussed patients delivery and complications. She describes her delivery as good. - Patient had a C-section.  - Patient has urinary incontinence? No. - Patient is safe to resume physical and sexual activity  6.  Health Maintenance - HM due items addressed Yes - Last pap smear  Diagnosis  Date Value Ref Range Status  04/14/2021   Final   - Negative for intraepithelial lesion or malignancy (NILM)   Pap smear not done at today's visit.  -Breast Cancer screening indicated? No.   7. Chronic Disease/Pregnancy Condition follow up: Hypertension - Continue Procardia XL 30 mg qd - PCP follow up scheduled on 10/22/21   Jaynie Collins, MD Center for [facility], [facility]

## 2022-05-22 ENCOUNTER — Other Ambulatory Visit: Payer: Self-pay

## 2022-05-22 ENCOUNTER — Encounter (HOSPITAL_COMMUNITY): Payer: Self-pay

## 2022-05-22 ENCOUNTER — Emergency Department (HOSPITAL_COMMUNITY)
Admission: EM | Admit: 2022-05-22 | Discharge: 2022-05-22 | Disposition: A | Discharge status: Home / Self Care | Payer: Medicaid Other | Attending: Emergency Medicine | Admitting: Emergency Medicine

## 2022-05-22 DIAGNOSIS — I10 Essential (primary) hypertension: Secondary | ICD-10-CM | POA: Diagnosis not present

## 2022-05-22 DIAGNOSIS — Z79899 Other long term (current) drug therapy: Secondary | ICD-10-CM | POA: Insufficient documentation

## 2022-05-22 DIAGNOSIS — J020 Streptococcal pharyngitis: Secondary | ICD-10-CM | POA: Diagnosis not present

## 2022-05-22 DIAGNOSIS — J029 Acute pharyngitis, unspecified: Secondary | ICD-10-CM | POA: Diagnosis present

## 2022-05-22 LAB — GROUP A STREP BY PCR: Group A Strep by PCR: DETECTED — AB

## 2022-05-22 MED ORDER — PENICILLIN G BENZATHINE 1200000 UNIT/2ML IM SUSY
1.2000 10*6.[IU] | PREFILLED_SYRINGE | Freq: Once | INTRAMUSCULAR | Status: AC
  Administered 2022-05-22: 1.2 10*6.[IU] via INTRAMUSCULAR
  Filled 2022-05-22: qty 2

## 2022-05-22 MED ORDER — LIDOCAINE VISCOUS HCL 2 % MT SOLN
15.0000 mL | Freq: Once | OROMUCOSAL | Status: AC
  Administered 2022-05-22: 15 mL via OROMUCOSAL
  Filled 2022-05-22: qty 15

## 2022-05-22 MED ORDER — DEXAMETHASONE SODIUM PHOSPHATE 10 MG/ML IJ SOLN
10.0000 mg | Freq: Once | INTRAMUSCULAR | Status: AC
  Administered 2022-05-22: 10 mg via INTRAMUSCULAR
  Filled 2022-05-22: qty 1

## 2022-05-22 NOTE — Discharge Instructions (Signed)
You were seen in the emergency department today for sore throat.  You do have strep throat.  We have treated you with penicillin here in the emergency department.  You do not require further treatment.  The steroids that we gave you here should work over the next 24 to 36 hours.  Please continue drink plenty of fluids you. may take Tylenol and Motrin for fever or sore throat.  Please return if you have difficulty swallowing liquids or difficulty breathing.

## 2022-05-22 NOTE — ED Triage Notes (Signed)
Patient reports sore throat starting on Friday, feels like her lymph nodes are swollen.

## 2022-05-22 NOTE — ED Provider Notes (Signed)
[place] EMERGENCY DEPARTMENT AT [facility] Provider Note   CSN: [id number] Arrival date & time: 05/22/22  0636     History  Chief Complaint  Patient presents with   Sore Throat    Lisa Charles is a 37 y.o. female.  With past medical history of hypertension, obesity who presents to the emergency department with sore throat.  Patient states that sore throat began on Friday.  She has had pain with swallowing.  States that her 2 children have had cough and runny nose.  She states that yesterday she had some swelling to the left side of her face and neck which has gone down a little bit.  She has been tolerating p.o. liquids and solids without difficulty.  She denies having fever.  She denies shortness of breath or difficulty breathing or wheezing.  She denies having any cough or rhinorrhea or myalgias.   Sore Throat       Home Medications Prior to Admission medications   Medication Sig Start Date End Date Taking? Authorizing Provider  ferrous sulfate (FERROUSUL) 325 (65 FE) MG tablet Take 1 tablet (325 mg total) by mouth every other day. 09/17/21   Wouk, Ailene Rud, MD  ibuprofen (ADVIL) 600 MG tablet Take 1 tablet (600 mg total) by mouth every 6 (six) hours. 09/17/21   Fabiola Backer, MD  NIFEdipine (PROCARDIA-XL/NIFEDICAL-XL) 30 MG 24 hr tablet Take 1 tablet (30 mg total) by mouth daily. 09/21/21   Gavin Pound, CNM  oxyCODONE (OXY IR/ROXICODONE) 5 MG immediate release tablet Take 1-2 tablets (5-10 mg total) by mouth every 4 (four) hours as needed for moderate pain. 09/17/21   Wouk, Ailene Rud, MD  Prenatal Vit-Fe Fumarate-FA (MULTIVITAMIN-PRENATAL) 27-0.8 MG TABS tablet Take 1 tablet by mouth daily at 12 noon.    [provider]  hydrochlorothiazide (HYDRODIURIL) 25 MG tablet Take 1 tablet (25 mg total) by mouth daily. Patient not taking: Reported on 09/18/2018 01/25/17 09/19/18  Woodroe Mode, MD  norethindrone (MICRONOR,CAMILA,ERRIN) 0.35 MG tablet  Take 1 tablet (0.35 mg total) by mouth daily. Patient not taking: Reported on 09/18/2018 02/22/17 09/19/18  Morene Crocker, CNM  omeprazole (PRILOSEC) 20 MG capsule Take 1 capsule (20 mg total) by mouth daily. Patient not taking: Reported on 09/18/2018 04/24/18 09/19/18  Montine Circle, PA-C      Allergies    Expectorant cough control [guaifenesin]    Review of Systems   Review of Systems  HENT:  Positive for facial swelling and sore throat.   All other systems reviewed and are negative.   Physical Exam Updated Vital Signs BP (!) 143/92   Pulse 77   Temp 97.7 F (36.5 C) (Oral)   Resp 18   Ht 5\' 9"  (1.753 m)   Wt (!) 151.5 kg   SpO2 100%   BMI 49.32 kg/m  Physical Exam Vitals and nursing note reviewed.  Constitutional:      General: She is not in acute distress.    Appearance: She is well-developed. She is obese. She is not ill-appearing.  HENT:     Head: Normocephalic and atraumatic.     Mouth/Throat:     Mouth: Mucous membranes are moist.     Pharynx: Uvula midline. Pharyngeal swelling and posterior oropharyngeal erythema present. No oropharyngeal exudate or uvula swelling.     Tonsils: No tonsillar exudate or tonsillar abscesses. 3+ on the right. 3+ on the left.     Comments: Oropharynx with 3+ bilateral tonsillar swelling and  erythema without exudate.  There is no unilateral swelling concerning for tonsillar abscess or deviation of the uvula.  There is no stridor or airway compromise.  There is no sublingual swelling and the floor of the mouth is soft.  She does have significant cervical lymphadenopathy on the left anterior cervical chain but I do not think that this is a deep space infection. Eyes:     General: No scleral icterus.    Conjunctiva/sclera: Conjunctivae normal.     Pupils: Pupils are equal, round, and reactive to light.  Pulmonary:     Effort: Pulmonary effort is normal. No respiratory distress.     Breath sounds: Normal breath sounds. No stridor. No  wheezing.  Musculoskeletal:     Cervical back: Normal range of motion.  Lymphadenopathy:     Cervical: Cervical adenopathy present.  Skin:    General: Skin is warm and dry.     Findings: No rash.  Neurological:     General: No focal deficit present.     Mental Status: She is alert and oriented to person, place, and time.  Psychiatric:        Mood and Affect: Mood normal.        Behavior: Behavior normal.        Thought Content: Thought content normal.        Judgment: Judgment normal.     ED Results / Procedures / Treatments   Labs (all labs ordered are listed, but only abnormal results are displayed) Labs Reviewed  GROUP A STREP BY PCR - Abnormal; Notable for the following components:      Result Value   Group A Strep by PCR DETECTED (*)    All other components within normal limits    EKG None  Radiology No results found.  Procedures Procedures   Medications Ordered in ED Medications  penicillin g benzathine (BICILLIN LA) 1200000 UNIT/2ML injection 1.2 Million Units (has no administration in time range)  dexamethasone (DECADRON) injection 10 mg (10 mg Intramuscular Given 05/22/22 0754)  lidocaine (XYLOCAINE) 2 % viscous mouth solution 15 mL (15 mLs Mouth/Throat Given 05/22/22 0753)    ED Course/ Medical Decision Making/ A&P   { Medical Decision Making Risk Prescription drug management.  Initial Impression and Ddx 36 year old female who presents to the emergency department with sore throat Patient PMH that increases complexity of ED encounter: Obesity and hypertension  Interpretation of Diagnostics I independent reviewed and interpreted the labs as followed: Group A strep positive  - I independently visualized the following imaging with scope of interpretation limited to determining acute life threatening conditions related to emergency care: Indicated  Patient Reassessment and Ultimate Disposition/Management 37 year old who presents with sore throat.  She  is nonseptic and nontoxic appearing.  She is hemodynamically stable and afebrile.  She has no reported fevers at home.  She does have quite significant tonsillar swelling bilaterally and some lymphadenopathy in the left anterior cervical chain. Obtain a strep test.  Have also ordered Decadron and viscous lidocaine.  She is tolerating p.o. fluids.  Has been tolerating solid food as well at home.  I do not feel that she needs CT soft tissue imaging at this time.  She has no fever, the submandibular space is soft.  There is no unilateral tonsillar swelling, unilateral deviation of the palate or uvula concerning for PTA.  Doubt RPA or Ludwig's angina.  I do think that this is cervical lymphadenopathy at reactive to her sore throat.  Strep throat here is  positive.  Will treat her with IM penicillin.  Do not feel she needs further workup at this time.  She tolerates p.o.  Will have her follow-up with PCP.  Given return precautions for inability to swallow liquids, further swelling of the face or neck, respiratory distress.  She verbalized understanding.  Otherwise feel that she is appropriate for discharge at this time.  The patient has been appropriately medically screened and/or stabilized in the ED. I have low suspicion for any other emergent medical condition which would require further screening, evaluation or treatment in the ED or require inpatient management. At time of discharge the patient is hemodynamically stable and in no acute distress. I have discussed work-up results and diagnosis with patient and answered all questions. Patient is agreeable with discharge plan. We discussed strict return precautions for returning to the emergency department and they verbalized understanding.     Patient management required discussion with the following services or consulting groups:  None  Complexity of Problems Addressed Acute complicated illness or Injury  Additional Data Reviewed and Analyzed Further  history obtained from: Past medical history and medications listed in the EMR and Prior ED visit notes  Patient Encounter Risk Assessment None  Final Clinical Impression(s) / ED Diagnoses Final diagnoses:  Strep pharyngitis    Rx / DC Orders ED Discharge Orders     None         Mickie Hillier, PA-C 05/22/22 [id number]    Dorie Rank, MD 05/23/22 [phone]

## 2022-06-03 IMAGING — US US MFM OB DETAIL+14 WK
1 series · 13 of 28 positions shown · non-contrast
Comparison: none

[Series 1: us mfm ob detail+14 wk · 118 acquisitions, 13 frames shown]
[im 5/118]
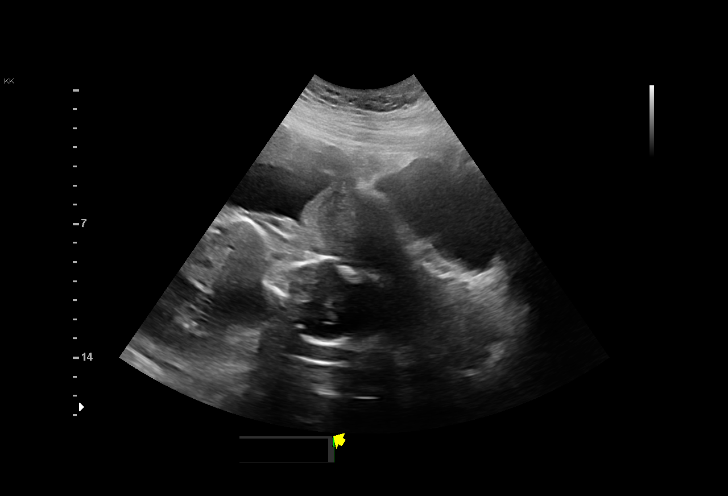
[im 14/118]
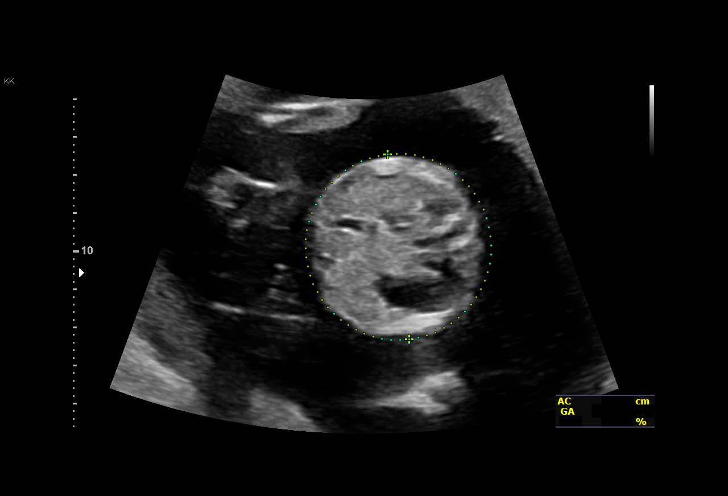
[im 22/118]
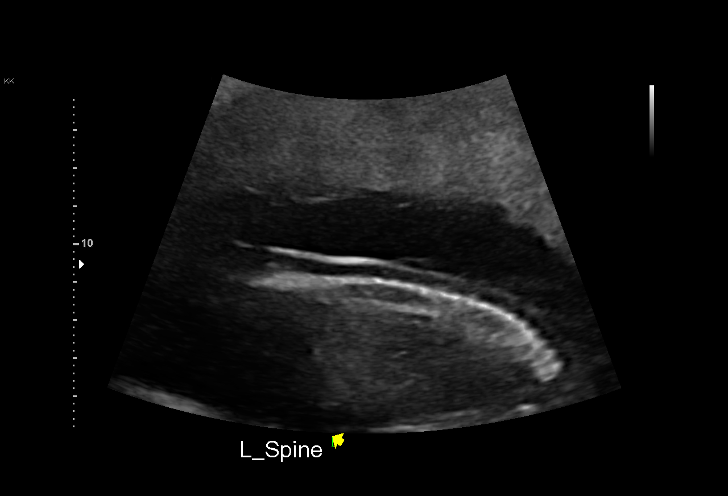
[im 31/118]
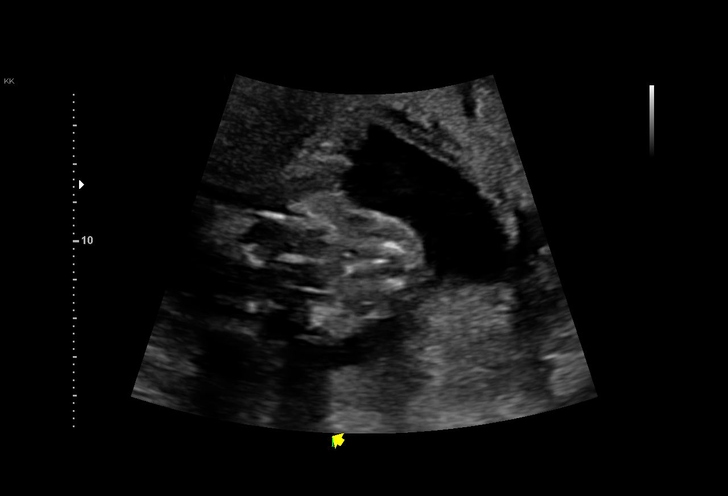
[im 40/118]
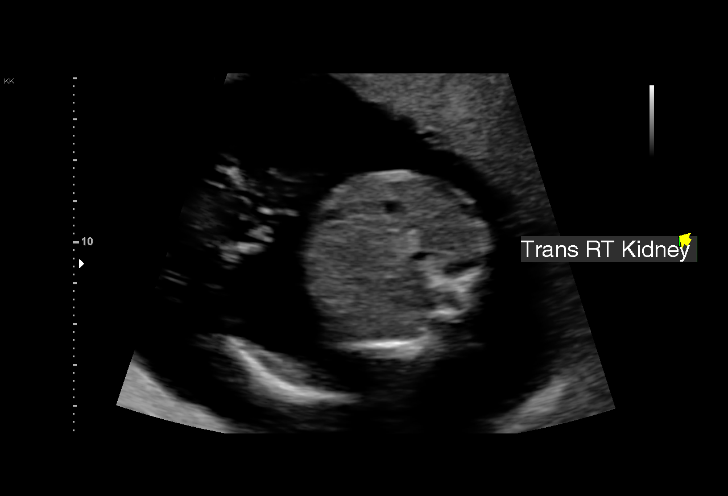
[im 48/118]
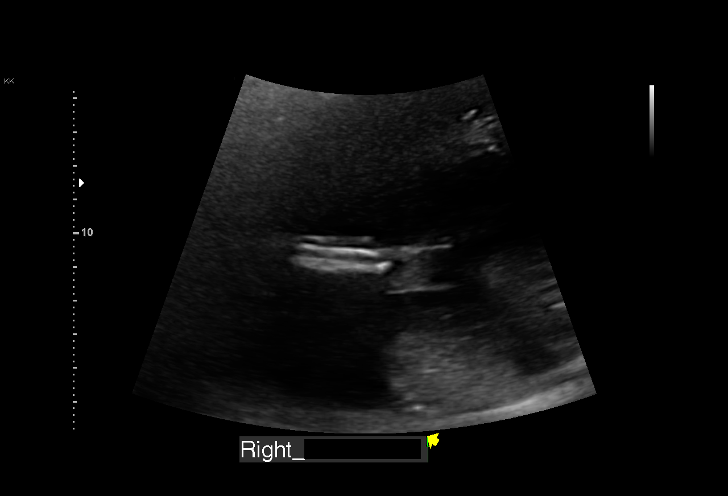
[im 61/118]
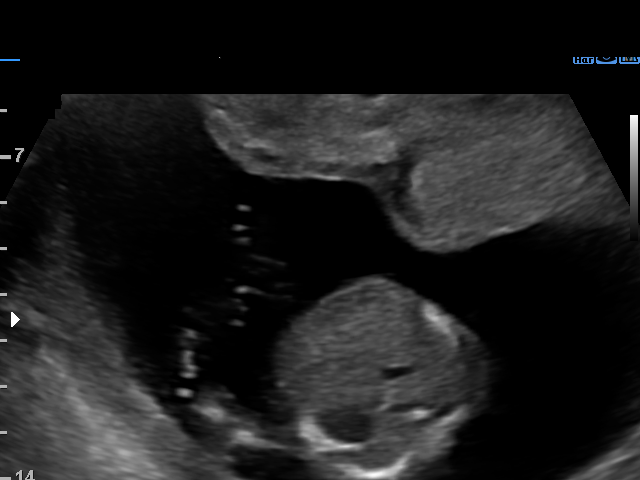
[im 70/118]
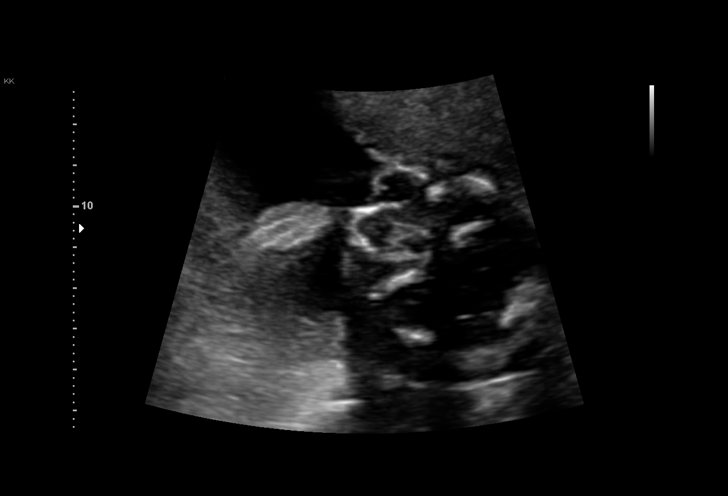
[im 79/118]
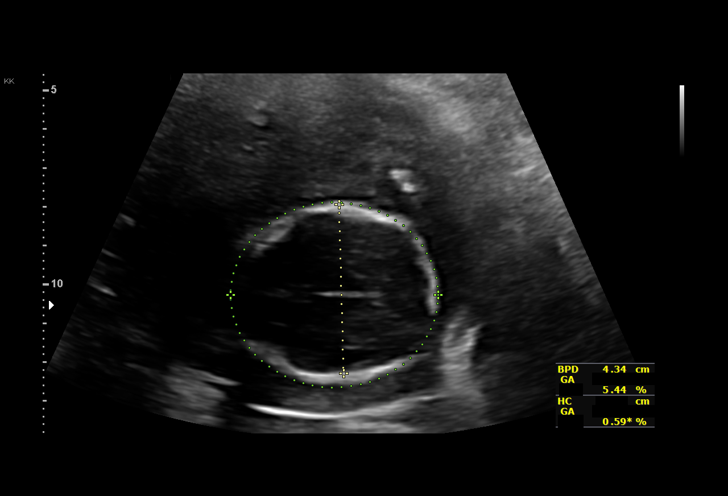
[im 87/118]
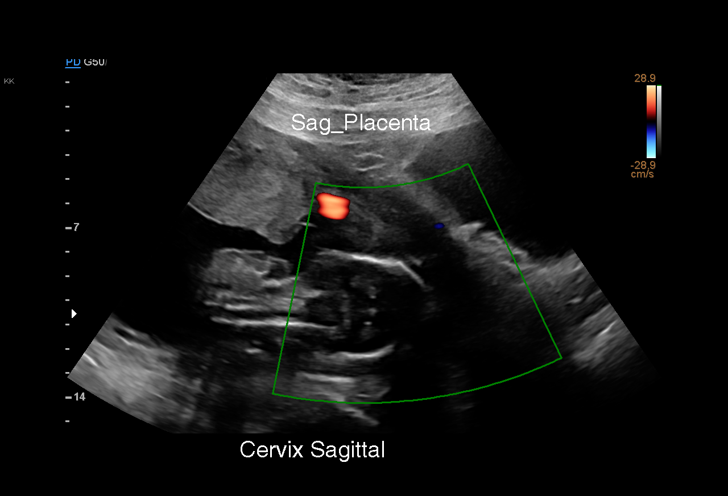
[im 96/118]
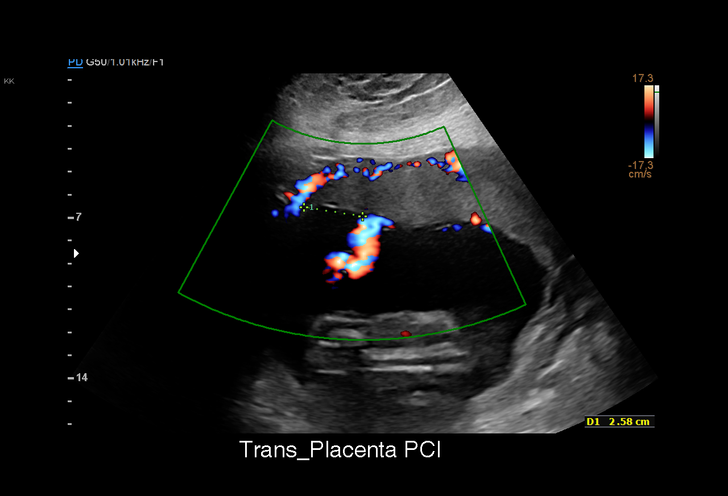
[im 105/118]
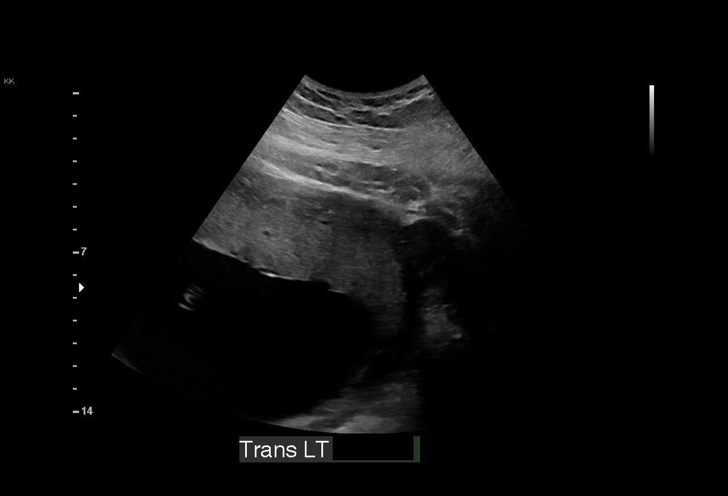
[im 113/118]
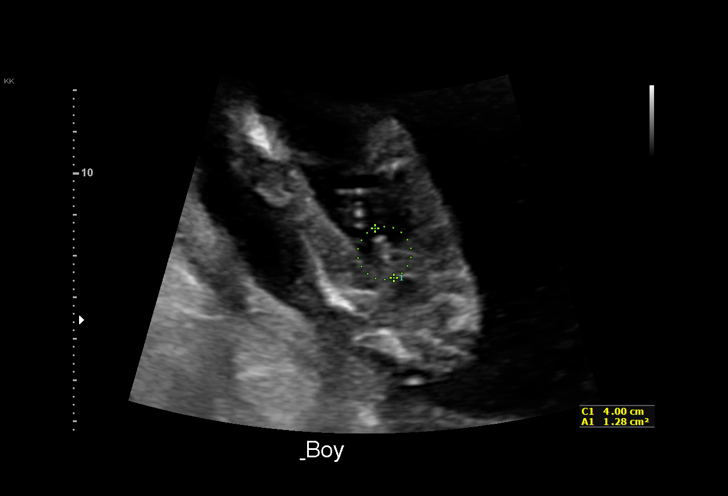

[13 of 28 positions shown; findings below may reference images not displayed]

[REDACTED]
                   [id number]

Indications

 Obesity complicating pregnancy, second
 trimester (BMI 46)
 Advanced maternal age multigravida 35+,
 second trimester (35 yrs)
 Hypertension - Chronic/Pre-existing
 Encounter for antenatal screening for
 malformations
 19 weeks gestation of pregnancy
 Low Risk (Negative AFP)
Fetal Evaluation

 Num Of Fetuses:         1
 Fetal Heart Rate(bpm):  138
 Cardiac Activity:       Observed
 Presentation:           Cephalic
 Placenta:               Anterior
 P. Cord Insertion:      Visualized

 Amniotic Fluid
 AFI FV:      Within normal limits

                             Largest Pocket(cm)

Biometry

 BPD:      43.1  mm     G. Age:  19w 0d         33  %    CI:         73.6   %    70 - 86
                                                         FL/HC:      18.8   %    16.1 -
 HC:      159.6  mm     G. Age:  18w 6d         16  %    HC/AC:      1.05        1.09 -
 AC:      152.2  mm     G. Age:  20w 3d         77  %    FL/BPD:     69.6   %
 FL:         30  mm     G. Age:  19w 2d         37  %    FL/AC:      19.7   %    20 - 24
 HUM:      30.8  mm     G. Age:  20w 2d         72  %

 LV:        6.3  mm

 Est. FW:     311  gm    0 lb 11 oz      65  %
OB History

 Gravidity:    3         Term:   2        Prem:   0        SAB:   0
 TOP:          0       Ectopic:  0        Living: 2
Gestational Age

 LMP:           20w 4d        Date:  12/07/20                 EDD:   09/13/21
 U/S Today:     19w 3d                                        EDD:   09/21/21
 Best:          19w 3d     Det. By:  U/S (04/30/21)           EDD:   09/21/21
Anatomy

 Cranium:               Appears normal         LVOT:                   Not well visualized
 Cavum:                 Appears normal         Aortic Arch:            Not well visualized
 Ventricles:            Appears normal         Ductal Arch:            Not well visualized
 Choroid Plexus:        Not well visualized    Diaphragm:              Appears normal
 Cerebellum:            Not well visualized    Stomach:                Appears normal, left
                                                                       sided
 Posterior Fossa:       Not well visualized    Abdomen:                Appears normal
 Nuchal Fold:           Not well visualized    Abdominal Wall:         Appears nml (cord
                                                                       insert, abd wall)
 Face:                  Not well visualized    Cord Vessels:           Appears normal (3
                                                                       vessel cord)
 Lips:                  Not well visualized    Kidneys:                Appear normal
 Palate:                Not well visualized    Bladder:                Appears normal
 Thoracic:              Appears normal         Spine:                  Not well visualized
 Heart:                 Not well visualized    Upper Extremities:      Appears normal
 RVOT:                  Not well visualized    Lower Extremities:      Appears normal

 Other:  Fetus a male. Left Hand and Heels visualized. Technically difficult
         due to maternal habitus.
Cervix Uterus Adnexa

 Cervix
 Length:           3.87  cm.
 Normal appearance by transabdominal scan.

 Adnexa
 No abnormality visualized.
Impression

 Single intrauterine pregnancy here for a detailed anatomy
 due to chronic hypertension not on medications.
 Normal anatomy with measurements consistent with today's
 ultrasound. I adjusted Ms. Ardiantoro Plur due to uncertain EDD
 There is good fetal movement and amniotic fluid volume
 Suboptimal views of the fetal anatomy were obtained
 secondary to fetal position and maternal habitus.

 Ms. Tiger has a low risk NIPS
 Blood pressure is noted to be 123/61 mmHg.
Recommendations

 Repeat growth in 4 weeks.

## 2023-11-02 ENCOUNTER — Encounter (HOSPITAL_BASED_OUTPATIENT_CLINIC_OR_DEPARTMENT_OTHER): Payer: Self-pay

## 2023-11-02 ENCOUNTER — Other Ambulatory Visit: Payer: Self-pay

## 2023-11-02 ENCOUNTER — Emergency Department (HOSPITAL_BASED_OUTPATIENT_CLINIC_OR_DEPARTMENT_OTHER)
Admission: EM | Admit: 2023-11-02 | Discharge: 2023-11-02 | Disposition: A | Discharge status: Home / Self Care | Attending: Emergency Medicine | Admitting: Emergency Medicine

## 2023-11-02 DIAGNOSIS — J069 Acute upper respiratory infection, unspecified: Secondary | ICD-10-CM | POA: Diagnosis not present

## 2023-11-02 DIAGNOSIS — I10 Essential (primary) hypertension: Secondary | ICD-10-CM | POA: Insufficient documentation

## 2023-11-02 DIAGNOSIS — Z79899 Other long term (current) drug therapy: Secondary | ICD-10-CM | POA: Insufficient documentation

## 2023-11-02 DIAGNOSIS — R059 Cough, unspecified: Secondary | ICD-10-CM | POA: Diagnosis present

## 2023-11-02 LAB — RESP PANEL BY RT-PCR (RSV, FLU A&B, COVID)  RVPGX2
Influenza A by PCR: NEGATIVE
Influenza B by PCR: NEGATIVE
Resp Syncytial Virus by PCR: NEGATIVE
SARS Coronavirus 2 by RT PCR: NEGATIVE

## 2023-11-02 MED ORDER — FLUTICASONE PROPIONATE 50 MCG/ACT NA SUSP: 1.0000 | Freq: Every day | NASAL | 2 refills | Status: AC

## 2023-11-02 MED ORDER — DEXAMETHASONE 4 MG PO TABS
10.0000 mg | ORAL_TABLET | Freq: Once | ORAL | Status: AC
  Administered 2023-11-02: 10 mg via ORAL
  Filled 2023-11-02: qty 3

## 2023-11-02 NOTE — ED Provider Notes (Signed)
 [place] EMERGENCY DEPARTMENT AT [facility] Provider Note   CSN: [id number] Arrival date & time: 11/02/23  9683     Patient presents with: Cough and Nasal Congestion   Lisa Charles is a 38 y.o. female.   HPI     This is a 38 year old female who presents with upper respiratory symptoms.  Patient reports that she over the last 2 days has developed cough and congestion.  She feels like she has sinus pressure.  Reports chills without documented fevers.  No shortness of breath or chest pain.  Denies nausea or vomiting.  No known sick contacts.  She does report that she has had increased stress as her father recently passed away.  Prior to Admission medications   Medication Sig Start Date End Date Taking? Authorizing Provider  fluticasone  (FLONASE ) 50 MCG/ACT nasal spray Place 1 spray into both nostrils daily. 11/02/23  Yes Lauralee Waters, Charmaine FALCON, MD  ferrous sulfate  (FERROUSUL) 325 (65 FE) MG tablet Take 1 tablet (325 mg total) by mouth every other day. 09/17/21   Wouk, Devaughn Sayres, MD  ibuprofen  (ADVIL ) 600 MG tablet Take 1 tablet (600 mg total) by mouth every 6 (six) hours. 09/17/21   Trudy Rosina RAMAN, MD  NIFEdipine  (PROCARDIA -XL/NIFEDICAL-XL) 30 MG 24 hr tablet Take 1 tablet (30 mg total) by mouth daily. 09/21/21   Synthia Raisin, CNM  oxyCODONE  (OXY IR/ROXICODONE ) 5 MG immediate release tablet Take 1-2 tablets (5-10 mg total) by mouth every 4 (four) hours as needed for moderate pain. 09/17/21   Wouk, Devaughn Sayres, MD  Prenatal Vit-Fe Fumarate-FA (MULTIVITAMIN-PRENATAL) 27-0.8 MG TABS tablet Take 1 tablet by mouth daily at 12 noon.    [provider]  hydrochlorothiazide  (HYDRODIURIL ) 25 MG tablet Take 1 tablet (25 mg total) by mouth daily. Patient not taking: Reported on 09/18/2018 01/25/17 09/19/18  Eveline Lynwood MATSU, MD  norethindrone  (MICRONOR ,CAMILA ,ERRIN ) 0.35 MG tablet Take 1 tablet (0.35 mg total) by mouth daily. Patient not taking: Reported on 09/18/2018 02/22/17  09/19/18  Marguerite Willma LABOR, CNM  omeprazole  (PRILOSEC) 20 MG capsule Take 1 capsule (20 mg total) by mouth daily. Patient not taking: Reported on 09/18/2018 04/24/18 09/19/18  Vicky Charleston, PA-C    Allergies: Expectorant cough control [guaifenesin]    Review of Systems  Constitutional:  Positive for chills. Negative for fever.  HENT:  Positive for congestion and sinus pressure.   Respiratory:  Positive for cough.   All other systems reviewed and are negative.   Updated Vital Signs BP (!) 107/56   Pulse 80   Temp 98.6 F (37 C) (Oral)   Resp 18   SpO2 99%   Physical Exam Vitals and nursing note reviewed.  Constitutional:      Appearance: She is well-developed. She is obese. She is not ill-appearing.  HENT:     Head: Normocephalic and atraumatic.     Nose: Congestion present.     Mouth/Throat:     Mouth: Mucous membranes are moist.     Comments: Uvula midline, no erythema, no tonsillar exudate Eyes:     Pupils: Pupils are equal, round, and reactive to light.  Cardiovascular:     Rate and Rhythm: Normal rate and regular rhythm.     Heart sounds: Normal heart sounds.  Pulmonary:     Effort: Pulmonary effort is normal. No respiratory distress.     Breath sounds: No wheezing.  Abdominal:     Palpations: Abdomen is soft.  Musculoskeletal:     Cervical back: Neck supple.  Skin:    General: Skin is warm and dry.  Neurological:     Mental Status: She is alert and oriented to person, place, and time.  Psychiatric:        Mood and Affect: Mood normal.     (all labs ordered are listed, but only abnormal results are displayed) Labs Reviewed  RESP PANEL BY RT-PCR (RSV, FLU A&B, COVID)  [id number]    EKG: None  Radiology: No results found.   Procedures   Medications Ordered in the ED  dexamethasone  (DECADRON ) tablet 10 mg (10 mg Oral Given 11/02/23 0430)                                    Medical Decision Making Risk Prescription drug management.   This  patient presents to the ED for concern of upper respiratory symptoms, this involves an extensive number of treatment options, and is a complaint that carries with it a high risk of complications and morbidity.  I considered the following differential and admission for this acute, potentially life threatening condition.  The differential diagnosis includes viral etiology such as COVID or influenza, sinusitis, pneumonia  MDM:    This is a 38 year old female who presents with upper respiratory symptoms.  Nontoxic and vital signs are reassuring.  He is congested on exam.  Overall looks well-hydrated.  Breath sounds are clear.  COVID and flu testing sent and is negative.  Suspect other viral etiology.  Low suspicion for bacterial pneumonia as patient is afebrile with clear breath sounds and satting 99%.  Will treat supportively.  (Labs, imaging, consults)  Labs: I Ordered, and personally interpreted labs.  The pertinent results include: COVID, influenza  Imaging Studies ordered: I ordered imaging studies including none I independently visualized and interpreted imaging. I agree with the radiologist interpretation  Additional history obtained from chart review.  External records from outside source obtained and reviewed including prior evaluations  Cardiac Monitoring: The patient was maintained on a cardiac monitor.  If on the cardiac monitor, I personally viewed and interpreted the cardiac monitored which showed an underlying rhythm of: Sinus  Reevaluation: After the interventions noted above, I reevaluated the patient and found that they have :improved  Social Determinants of Health:  lives independently  Disposition: Discharge  Co morbidities that complicate the patient evaluation  Past Medical History:  Diagnosis Date   Bronchitis    Hay fever    Hypertension    Morbid obesity (HCC)    Pre-eclampsia      Medicines Meds ordered this encounter  Medications   dexamethasone   (DECADRON ) tablet 10 mg   fluticasone  (FLONASE ) 50 MCG/ACT nasal spray    Sig: Place 1 spray into both nostrils daily.    Dispense:  18.2 mL    Refill:  2    I have reviewed the patients home medicines and have made adjustments as needed  Problem List / ED Course: Problem List Items Addressed This Visit   None Visit Diagnoses       Viral URI    -  Primary                Final diagnoses:  Viral URI    ED Discharge Orders          Ordered    fluticasone  (FLONASE ) 50 MCG/ACT nasal spray  Daily        11/02/23 0438  Bari Charmaine FALCON, MD 11/02/23 [phone]

## 2023-11-02 NOTE — ED Triage Notes (Signed)
 Pt presents via POV c/o cough and congestion that started yesterday. Reports chills. Afebrile currently.

## 2023-11-02 NOTE — Discharge Instructions (Signed)
 Make sure that you stay hydrated.  Use Flonase  or nasal saline.  Tylenol  Motrin  for body aches or pains.
